# Patient Record
Sex: Female | Born: 1980
Health system: Southern US, Community
[De-identification: ages and names within clinical notes are randomized; demographics above are authoritative.]

## PROBLEM LIST (undated history)

## (undated) DIAGNOSIS — J302 Other seasonal allergic rhinitis: Secondary | ICD-10-CM

## (undated) DIAGNOSIS — Q8500 Neurofibromatosis, unspecified: Secondary | ICD-10-CM

## (undated) DIAGNOSIS — T8859XA Other complications of anesthesia, initial encounter: Secondary | ICD-10-CM

## (undated) DIAGNOSIS — R000 Tachycardia, unspecified: Secondary | ICD-10-CM

## (undated) DIAGNOSIS — M419 Scoliosis, unspecified: Secondary | ICD-10-CM

## (undated) DIAGNOSIS — F419 Anxiety disorder, unspecified: Secondary | ICD-10-CM

## (undated) DIAGNOSIS — K219 Gastro-esophageal reflux disease without esophagitis: Secondary | ICD-10-CM

## (undated) DIAGNOSIS — G43909 Migraine, unspecified, not intractable, without status migrainosus: Secondary | ICD-10-CM

## (undated) HISTORY — PX: WISDOM TOOTH EXTRACTION: SHX21

## (undated) HISTORY — DX: Other seasonal allergic rhinitis: J30.2

## (undated) HISTORY — DX: Scoliosis, unspecified: M41.9

## (undated) HISTORY — DX: Gastro-esophageal reflux disease without esophagitis: K21.9

## (undated) HISTORY — DX: Tachycardia, unspecified: R00.0

## (undated) HISTORY — DX: Anxiety disorder, unspecified: F41.9

## (undated) HISTORY — PX: TOE AMPUTATION: SHX809

## (undated) HISTORY — PX: DILATION AND CURETTAGE OF UTERUS: SHX78

---

## 2013-05-12 ENCOUNTER — Emergency Department (HOSPITAL_COMMUNITY)
Admission: EM | Admit: 2013-05-12 | Discharge: 2013-05-12 | Disposition: A | Discharge status: Home / Self Care | Payer: Self-pay | Attending: Emergency Medicine | Admitting: Emergency Medicine

## 2013-05-12 ENCOUNTER — Encounter (HOSPITAL_COMMUNITY): Payer: Self-pay | Admitting: Emergency Medicine

## 2013-05-12 DIAGNOSIS — Z3202 Encounter for pregnancy test, result negative: Secondary | ICD-10-CM | POA: Insufficient documentation

## 2013-05-12 DIAGNOSIS — Z8679 Personal history of other diseases of the circulatory system: Secondary | ICD-10-CM | POA: Insufficient documentation

## 2013-05-12 DIAGNOSIS — R51 Headache: Secondary | ICD-10-CM | POA: Insufficient documentation

## 2013-05-12 DIAGNOSIS — B349 Viral infection, unspecified: Secondary | ICD-10-CM

## 2013-05-12 DIAGNOSIS — B9789 Other viral agents as the cause of diseases classified elsewhere: Secondary | ICD-10-CM | POA: Insufficient documentation

## 2013-05-12 DIAGNOSIS — R112 Nausea with vomiting, unspecified: Secondary | ICD-10-CM | POA: Insufficient documentation

## 2013-05-12 HISTORY — DX: Neurofibromatosis, unspecified: Q85.00

## 2013-05-12 HISTORY — DX: Migraine, unspecified, not intractable, without status migrainosus: G43.909

## 2013-05-12 LAB — URINALYSIS, ROUTINE W REFLEX MICROSCOPIC
BILIRUBIN URINE: NEGATIVE
Glucose, UA: NEGATIVE mg/dL
Hgb urine dipstick: NEGATIVE
Ketones, ur: 15 mg/dL — AB
Leukocytes, UA: NEGATIVE
Nitrite: NEGATIVE
PH: 6 (ref 5.0–8.0)
Protein, ur: NEGATIVE mg/dL
Specific Gravity, Urine: >1.03 — ABNORMAL HIGH (ref 1.005–1.030)
Urobilinogen, UA: 0.2 mg/dL (ref 0.0–1.0)

## 2013-05-12 MED ORDER — ONDANSETRON 8 MG PO TBDP
8.0000 mg | ORAL_TABLET | Freq: Once | ORAL | Status: AC
  Administered 2013-05-12: 8 mg via ORAL
  Filled 2013-05-12: qty 1

## 2013-05-12 MED ORDER — ACETAMINOPHEN 500 MG PO TABS
1000.0000 mg | ORAL_TABLET | Freq: Once | ORAL | Status: AC
  Administered 2013-05-12: 1000 mg via ORAL
  Filled 2013-05-12: qty 2

## 2013-05-12 NOTE — ED Provider Notes (Signed)
  Medical screening examination/treatment/procedure(s) were performed by non-physician practitioner and as supervising physician I was immediately available for consultation/collaboration.     Apryll Hinkle, MD 05/12/13 1536 

## 2013-05-12 NOTE — Discharge Instructions (Signed)
Viral Infections A viral infection can be caused by different types of viruses.Most viral infections are not serious and resolve on their own. However, some infections may cause severe symptoms and may lead to further complications. SYMPTOMS Viruses can frequently cause:  Minor sore throat.  Aches and pains.  Headaches.  Runny nose.  Different types of rashes.  Watery eyes.  Tiredness.  Cough.  Loss of appetite.  Gastrointestinal infections, resulting in nausea, vomiting, and diarrhea. These symptoms do not respond to antibiotics because the infection is not caused by bacteria. However, you might catch a bacterial infection following the viral infection. This is sometimes called a "superinfection." Symptoms of such a bacterial infection may include:  Worsening sore throat with pus and difficulty swallowing.  Swollen neck glands.  Chills and a high or persistent fever.  Severe headache.  Tenderness over the sinuses.  Persistent overall ill feeling (malaise), muscle aches, and tiredness (fatigue).  Persistent cough.  Yellow, green, or brown mucus production with coughing. HOME CARE INSTRUCTIONS   Only take over-the-counter or prescription medicines for pain, discomfort, diarrhea, or fever as directed by your caregiver.  Drink enough water and fluids to keep your urine clear or pale yellow. Sports drinks can provide valuable electrolytes, sugars, and hydration.  Get plenty of rest and maintain proper nutrition. Soups and broths with crackers or rice are fine. SEEK IMMEDIATE MEDICAL CARE IF:   You have severe headaches, shortness of breath, chest pain, neck pain, or an unusual rash.  You have uncontrolled vomiting, diarrhea, or you are unable to keep down fluids.  You or your child has an oral temperature above 102 F (38.9 C), not controlled by medicine.  Your baby is older than 3 months with a rectal temperature of 102 F (38.9 C) or higher.  Your baby is 51  months old or younger with a rectal temperature of 100.4 F (38 C) or higher. MAKE SURE YOU:   Understand these instructions.  Will watch your condition.  Will get help right away if you are not doing well or get worse. Document Released: 01/06/2005 Document Revised: 06/21/2011 Document Reviewed: 08/03/2010 [facility] Patient Information 2014 [place], [place].    Emergency Department Resource Guide 1) Find a Doctor and Pay Out of Pocket Although you won't have to find out who is covered by your insurance plan, it is a good idea to ask around and get recommendations. You will then need to call the office and see if the doctor you have chosen will accept you as a new patient and what types of options they offer for patients who are self-pay. Some doctors offer discounts or will set up payment plans for their patients who do not have insurance, but you will need to ask so you aren't surprised when you get to your appointment.  2) Contact Your Local Health Department Not all health departments have doctors that can see patients for sick visits, but many do, so it is worth a call to see if yours does. If you don't know where your local health department is, you can check in your phone book. The CDC also has a tool to help you locate your state's health department, and many state websites also have listings of all of their local health departments.  3) Find a Walk-in Clinic If your illness is not likely to be very severe or complicated, you may want to try a walk in clinic. These are popping up all over the country in pharmacies, drugstores, and shopping  centers. They're usually staffed by nurse practitioners or physician assistants that have been trained to treat common illnesses and complaints. They're usually fairly quick and inexpensive. However, if you have serious medical issues or chronic medical problems, these are probably not your best option.  No Primary Care Doctor: - Call Health Connect  at  [phone] - they can help you locate a primary care doctor that  accepts your insurance, provides certain services, etc. - Physician Referral Service- [phone]  Chronic Pain Problems: Organization         Address  Phone   Notes  [facility]  [phone] Patients need to be referred by their primary care doctor.   Medication Assistance: Organization         Address  Phone   Notes  [facility] [address] [place], [place] [address] [phone] --Must be a resident of [facility] -- Must have NO insurance coverage whatsoever (no Medicaid/ Medicare, etc.) -- The pt. MUST have a primary care doctor that directs their care regularly and follows them in the community   [facility]  [phone]   [employer]  [phone]    Agencies that provide inexpensive medical care: Organization         Address  Phone   Notes  [facility]  [phone]   [facility]    [phone]   [facility] [address] [place], [place] [address] [phone]   [facility] [address] [place], [place] [phone]   Planned Parenthood    [phone]   [facility]    [phone]   [facility]  [address], [place] Phone:  [phone], Fax:  [phone] Hours of Operation:  9 am - 6 pm, M-F.  Also accepts Medicaid/Medicare and self-pay.  [facility]  [address], [place] Phone: [phone], Fax: [phone]. Hours of Operation:  8:30 am - 5:30 pm, M-F.  Also accepts Medicaid and self-pay.  [facility] [place] [address] [place] Phone: [phone]   [facility] [address] Natasha Bence [place], [place] [phone], Ext. [phone] Mondays & Thursdays: 7-9 AM.  First 15 patients are seen on a first come, first serve basis.     Medicaid-accepting [facility] Providers:  Organization         Address  Phone   Notes  [facility] [address], [place] [phone] Also accepts self-pay patients.  [facility] [address] Laurell Josephs [place], [place]  [phone]   [facility] [address], [place] [phone]   [facility] [address] [place] [phone]   Renaye Rakers [address], [place]   [phone] Only accepts [place] patients after they have their name applied to their card.   Self-Pay (no insurance) in [facility]:  Organization         Address  Phone   Notes  Sickle Cell Patients, [facility] [address] [place], [place] [phone]   [facility] [address] [place], [place] [phone]   [facility] [place]  [address],  [phone]  [facility]/Dr. Osei-Bonsu  [address] [place] [address], [place] or [address], [place] [phone] Phone number for both [place] and [place] locations is the same.  [facility] [address], [place] [phone]   [facility] [place] [address] [place] [address], [place] or [address] [phone]   [facility] [address], [place] [phone]) [phone], phone; [phone], fax Sees patients 1st and 3rd Saturday of every month.  Must not qualify for public or private insurance (i.e. Medicaid, Medicare, [place] Health Choice, Veterans' Benefits)  Household income should be no more than 200% of the poverty level The clinic cannot treat you if you are pregnant or think you are pregnant  Sexually transmitted diseases are not treated at the clinic.    Dental Care: Organization         Address  Phone  Notes  [facility]  Department of [facility] [address], [place] [phone] Accepts children up to age 821 who are enrolled in [place]icaid or [place] Health Choice; pregnant women with a Medicaid card; and children who have applied for Medicaid or [place] Health Choice, but were declined, whose parents can pay a reduced fee at time of service.  [facility] [facility] of [facility] [place]  [address], [place] [phone] Accepts children up to age 33 who are enrolled in [place] or [place] Health Choice; pregnant women with a Medicaid card; and children who have applied for Medicaid or [place] Health Choice, but were declined, whose parents can pay a reduced fee at time of service.  [facility]  [address], [place] [phone]) [phone] Patients are seen by appointment only. Walk-ins are not accepted. [facility] will see patients 718 years of age and older. Monday - Tuesday (8am-5pm) Most Wednesdays (8:30-5pm) $30 per visit, cash only  [facility]  [address], [facility] [phone]) [phone] Patients are seen by appointment only. Walk-ins are not accepted. [facility] will see patients 33 years of age and older. One Wednesday Evening (Monthly: Volunteer Based).  $30 per visit, cash only  [employer] [facility]  [phone] for adults; Children under age 134, call Graduate Pediatric Dentistry at [phone]. Children aged 734-14, please call [phone] to request a pediatric application.  Dental services are provided in all areas of dental care including fillings, crowns and bridges, complete and partial dentures, implants, gum treatment, root canals, and extractions. Preventive care is also provided. Treatment is provided to both adults and children. Patients are selected via a lottery and there is often a waiting list.   [facility] [address], [place]  [phone]  [email or web address]   [facility] [address], [place], [place] [phone], Ext. [phone] Second and Fourth Thursday of each month, opens at 6:30 AM; Clinic ends at 9 AM.  Patients are seen on a first-come first-served basis, and a limited number are seen during each clinic.   [facility]  [address], [place], [place] [phone]) [phone]   Eligibility Requirements You must have lived in [place], [place], or [place] counties for at least the last three months.   You cannot be eligible for state or federal sponsored National Cityhealthcare insurance, including CIGNAVeterans Administration, [place]icaid, or [employer]Medicare.   You generally cannot be eligible for healthcare insurance  through your employer.    How to apply: Eligibility screenings are held every Tuesday and Wednesday afternoon from 1:00 pm until 4:00 pm. You do not need an appointment for the interview!  [facility] [address], [place], [place] [phone]   [facility]  [phone]   [facility] [facility]  [phone]   [facility]  [phone]    Behavioral Health Resources in the Community: Intensive Outpatient Programs Organization         Address  Phone  Notes  [facility] [address], [place], [place] [phone]   [facility] Outpatient [address], [place], [place] [phone]   [facility] [address], [place], [place]  [phone]   [facility] [address],  [place], [place] [phone] or [phone]   Substance Abuse Resources Organization         Address  Phone  Notes  [facility]  [phone]   [facility]  [phone]   [facility]  [phone]   [facility]  [phone]   Residential & Outpatient Substance Abuse Program  [phone]   Psychological Services Organization          Address  Phone  Notes  [facility]  [phone]- [phone]   [facility]  [phone]- [phone]   [facility] [address], [place] [phone] or [phone]    Mobile Crisis Teams Organization         Address  Phone  Notes  [facility], Mobile Crisis Care Unit  [phone]   [facility]  [address] [place], [place] [phone]   Doristine LocksSharon DeEsch [address] [place] [phone]    Self-Help/Support Groups Organization         Address  Phone             Notes  Mental Health Assoc. of  - variety of support groups  [phone] Call for more information  Narcotics Anonymous (NA), [facility] [address], [place]  2 meetings at this location   Statisticianesidential Treatment Programs Organization         Address  Phone  Notes  [facility] [address],    [place]  [phone]   [facility]  [address], [place] [phone], [place], [place] [phone]   [facility] [address], [place] [phone] Admissions: 8am-3pm M-F  [facility] [address]    [place], [place] [phone]   [facility] [address], [place], [place] [phone]   [facility] [address]  [place], [place] [phone]   [facility] - Intensive Outpatient [address], [place], [place] [phone]   [facility] ([facility] [address]  [place], [place] [phone] or [phone]   [facility] (RT[facility]) [address] [place], [place] [phone] Accepts Medicaid  [facility] [address]  [place] [phone] Substance Abuse/Addiction Treatment   [facility]al Health Resources Organization         Address  Phone  Notes  [facility]  [phone]   Angie FavaJulie Brannon, PhD [address] [place], [place]   [phone] or [phone]) [phone]   [facility]   [address] [place], [place] [phone]  [facility] [address], [place], [place] [phone] Insurance/Medicaid/sponsorship through [facility]  [facility] Families [address]                                    [place], [place] [phone] Therapy/tele-psych/case  [facility] [address]   [place], [place] [phone]    Dr. Lolly MustacheArfeen  [phone]   [facility]  [employer] [facility] 1) [address], [place] 2) [address], [place] 3)  [address], [place] [phone]  [phone]) [phone]   [facility] Child Abuse Hotline [phone]) [phone] or [phone](336) [phone] (After Hours)         Rest and make sure you are drinking plenty of fluids.  Use your home phenergan if needed for continued nausea.

## 2013-05-12 NOTE — ED Notes (Signed)
Pt given water and sipping at this time.   nad noted.

## 2013-05-12 NOTE — ED Notes (Signed)
Pt c/o decreased appetite, headache, body aches, chills that started two days ago, n/v X1 this am while in waiting room,

## 2013-05-12 NOTE — ED Provider Notes (Signed)
CSN: [id number]     Arrival date & time 05/12/13  [id number] History   First MD Initiated Contact with Patient 05/12/13 [phone]     Chief Complaint  Patient presents with  . Headache  . Emesis  . Chills   (Consider location/radiation/quality/duration/timing/severity/associated sxs/prior Treatment) HPI Comments: Anna Willis is a 33 y.o. Female with a history of neurofibromatosis and migraine headache presenting with a 2 day history of generalized intermittent headache (which is not similar to her typical migraine which is generally right sided in location), along with decreased appetite, nausea, generalized body aches and chills and one episode of vomiting this morning upon arrival in our waiting room.  She denies abdominal pain, diarrhea, last BM was yesterday and normal.  She denies increased urinary frequency or dysuria, has had no cough, shortness of breath or chest pain, dizziness, neck pain or stiffness.  She is taking no medications for her symptoms.  She she states been under increased stress as [place], started a new job and has not been getting enough rest.   The history is provided by the patient.    Past Medical History  Diagnosis Date  . Migraine   . Neurofibromatosis    Past Surgical History  Procedure Laterality Date  . Dilation and curettage of uterus    . Toe amputation      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    No family history on file. History  Substance Use Topics  . Smoking status: Never Smoker   . Smokeless tobacco: Not on file  . Alcohol Use: No   OB History   Grav Para Term Preterm Abortions TAB SAB Ect Mult Living                 Review of Systems  Constitutional: Positive for chills and appetite change. Negative for fever.  HENT: Negative for congestion, postnasal drip, rhinorrhea, sinus pressure, sore throat and trouble swallowing.   Eyes: Negative.   Respiratory: Negative for chest tightness and shortness of breath.    Cardiovascular: Negative for chest pain.  Gastrointestinal: Positive for nausea and vomiting. Negative for abdominal pain.  Genitourinary: Negative.  Negative for dysuria.  Musculoskeletal: Negative for arthralgias, joint swelling and neck pain.  Skin: Negative.  Negative for rash and wound.  Neurological: Positive for headaches. Negative for dizziness, weakness, light-headedness and numbness.  Psychiatric/Behavioral: Negative.     Allergies  Zithromax  Home Medications  No current outpatient prescriptions on file. BP 124/67  Pulse 111  Temp(Src) 97.7 F (36.5 C) (Oral)  Resp 18  Ht 5' (1.524 m)  Wt 130 lb (58.968 kg)  BMI 25.39 kg/m2  SpO2 99%  LMP 04/22/2013 Physical Exam  Nursing note and vitals reviewed. Constitutional: She is oriented to person, place, and time. She appears well-developed and well-nourished.  HENT:  Head: Normocephalic and atraumatic.  Mouth/Throat: Oropharynx is clear and moist.  Eyes: EOM are normal. Pupils are equal, round, and reactive to light.  Neck: Normal range of motion. Neck supple.  Cardiovascular: Normal rate and normal heart sounds.   Pulmonary/Chest: Effort normal.  Abdominal: Soft. There is no tenderness.  Musculoskeletal: Normal range of motion.  Lymphadenopathy:    She has no cervical adenopathy.  Neurological: She is alert and oriented to person, place, and time. She has normal strength. No sensory deficit. Gait normal. GCS eye subscore is 4. GCS verbal subscore is 5. GCS motor subscore is 6.  Normal heel-shin, normal rapid  alternating movements. Cranial nerves III-XII intact.  No pronator drift.  Skin: Skin is warm and dry. No rash noted.  Psychiatric: She has a normal mood and affect. Her speech is normal and behavior is normal. Thought content normal. Cognition and memory are normal.    ED Course  Procedures (including critical care time) Labs Review Labs Reviewed  URINALYSIS, ROUTINE W REFLEX MICROSCOPIC - Abnormal; Notable  for the following:    Specific Gravity, Urine >1.030 (*)    Ketones, ur 15 (*)    All other components within normal limits    Bedside u preg negative.   Imaging Review No results found.  EKG Interpretation   None       MDM   1. Viral syndrome    Hx c/w viral syndrome,  Possibly mild influenza.  She did not get a flu vaccine this season.  She has tolerated PO fluids in ed,  Headache improved with tylenol, nausea resolving with zofran.  She has home phenergan and will take this if nausea returns.   The patient appears reasonably screened and/or stabilized for discharge and I doubt any other medical condition or other [facility] requiring further screening, evaluation, or treatment in the ED at this time prior to discharge.     Burgess Amor, PA-C 05/12/13 [phone]

## 2013-05-14 LAB — POCT PREGNANCY, URINE: Preg Test, Ur: NEGATIVE

## 2014-06-23 ENCOUNTER — Encounter (HOSPITAL_COMMUNITY): Payer: Self-pay | Admitting: Emergency Medicine

## 2014-06-23 ENCOUNTER — Emergency Department (HOSPITAL_COMMUNITY)
Admission: EM | Admit: 2014-06-23 | Discharge: 2014-06-23 | Disposition: A | Discharge status: Home / Self Care | Payer: BLUE CROSS/BLUE SHIELD | Source: Home / Self Care | Attending: Family Medicine | Admitting: Family Medicine

## 2014-06-23 DIAGNOSIS — A084 Viral intestinal infection, unspecified: Secondary | ICD-10-CM

## 2014-06-23 MED ORDER — ACETAMINOPHEN 325 MG PO TABS
975.0000 mg | ORAL_TABLET | Freq: Once | ORAL | Status: AC
  Administered 2014-06-23: 975 mg via ORAL

## 2014-06-23 MED ORDER — SODIUM CHLORIDE 0.9 % IV BOLUS (SEPSIS)
1000.0000 mL | Freq: Once | INTRAVENOUS | Status: AC
  Administered 2014-06-23: 1000 mL via INTRAVENOUS

## 2014-06-23 MED ORDER — ONDANSETRON HCL 4 MG/2ML IJ SOLN
INTRAMUSCULAR | Status: AC
  Filled 2014-06-23: qty 2

## 2014-06-23 MED ORDER — ONDANSETRON HCL 8 MG PO TABS: 8.0000 mg | ORAL_TABLET | Freq: Three times a day (TID) | ORAL | Status: DC | PRN

## 2014-06-23 MED ORDER — ACETAMINOPHEN 325 MG PO TABS
ORAL_TABLET | ORAL | Status: AC
  Filled 2014-06-23: qty 3

## 2014-06-23 MED ORDER — ONDANSETRON HCL 4 MG/2ML IJ SOLN
4.0000 mg | Freq: Once | INTRAMUSCULAR | Status: AC
  Administered 2014-06-23: 4 mg via INTRAVENOUS

## 2014-06-23 NOTE — ED Notes (Signed)
Reports acute on set of  N/v/d, hot cold chills,  Congestion.  States within the several minutes of waiting now having body aches.

## 2014-06-23 NOTE — ED Notes (Signed)
Iv  Ns   1  Liter   Wide  Open

## 2014-06-23 NOTE — ED Provider Notes (Signed)
Anna Willis is a 34 y.o. female who presents to Urgent Care today for Vomiting and diarrhea. 2 days ago patient had runny nose and nasal congestion. This morning she awoke with vomiting diarrhea and body aches. She has not tried any medications yet. No fevers or chills. No chest pains or palpitations. Vomiting is nonbloody and nonbilious. No blood in the diarrhea. Patient is currently taking bromocriptine in preparation for intrauterine insemination scheduled for 6 days from now. She has a history of neurofibromatosis type I.   Past Medical History  Diagnosis Date  . Migraine   . Neurofibromatosis    Past Surgical History  Procedure Laterality Date  . Dilation and curettage of uterus    . Toe amputation      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    History  Substance Use Topics  . Smoking status: Never Smoker   . Smokeless tobacco: Not on file  . Alcohol Use: No   ROS as above Medications: No current facility-administered medications for this encounter.   Current Outpatient Prescriptions  Medication Sig Dispense Refill  . bromocriptine (PARLODEL) 5 MG capsule Take 5 mg by mouth daily.    . Lansoprazole (PREVACID PO) Take by mouth.    . Loratadine (CLARITIN PO) Take by mouth.    . ondansetron (ZOFRAN) 8 MG tablet Take 1 tablet (8 mg total) by mouth every 8 (eight) hours as needed for nausea or vomiting. 20 tablet 0   Allergies  Allergen Reactions  . Zithromax [Azithromycin]     Rash      Exam:  BP 125/91 mmHg  Pulse 121  Temp(Src) 98.7 F (37.1 C) (Oral)  Resp 16  SpO2 96%  LMP 06/17/2014 Gen: Well NAD HEENT: EOMI,  MMM Lungs: Normal work of breathing. CTABL Heart: tachycardia no MRG Abd: NABS, Soft. Nondistended, Nontender Exts: Brisk capillary refill, warm and well perfused.   Patient was given 1 L normal saline bolus, 4 mg of IV Zofran, and 975 mg of oral Tylenol, and felt much better.  No results found for this or any previous visit (from the past 24  hour(s)). No results found.  Assessment and Plan: 34 y.o. female with Viral gastroenteritis.  Treat with Zofran Tylenol and Imodium. Continue oral hydration. Return as needed.  Discussed warning signs or symptoms. Please see discharge instructions. Patient expresses understanding.     Rodolph BongEvan S Manolo Bosket, MD 06/23/14 1115

## 2014-06-23 NOTE — Discharge Instructions (Signed)
Thank you for coming in today. Continue over-the-counter Tylenol and Imodium as needed. Take Zofran as needed for vomiting. Drink plenty of fluids. If your belly pain worsens, or you have high fever, bad vomiting, blood in your stool or black tarry stool go to the Emergency Room.   Viral Gastroenteritis Viral gastroenteritis is also known as stomach flu. This condition affects the stomach and intestinal tract. It can cause sudden diarrhea and vomiting. The illness typically lasts 3 to 8 days. Most people develop an immune response that eventually gets rid of the virus. While this natural response develops, the virus can make you quite ill. CAUSES  Many different viruses can cause gastroenteritis, such as rotavirus or noroviruses. You can catch one of these viruses by consuming contaminated food or water. You may also catch a virus by sharing utensils or other personal items with an infected person or by touching a contaminated surface. SYMPTOMS  The most common symptoms are diarrhea and vomiting. These problems can cause a severe loss of body fluids (dehydration) and a body salt (electrolyte) imbalance. Other symptoms may include:  Fever.  Headache.  Fatigue.  Abdominal pain. DIAGNOSIS  Your caregiver can usually diagnose viral gastroenteritis based on your symptoms and a physical exam. A stool sample may also be taken to test for the presence of viruses or other infections. TREATMENT  This illness typically goes away on its own. Treatments are aimed at rehydration. The most serious cases of viral gastroenteritis involve vomiting so severely that you are not able to keep fluids down. In these cases, fluids must be given through an intravenous line (IV). HOME CARE INSTRUCTIONS   Drink enough fluids to keep your urine clear or pale yellow. Drink small amounts of fluids frequently and increase the amounts as tolerated.  Ask your caregiver for specific rehydration  instructions.  Avoid:  Foods high in sugar.  Alcohol.  Carbonated drinks.  Tobacco.  Juice.  Caffeine drinks.  Extremely hot or cold fluids.  Fatty, greasy foods.  Too much intake of anything at one time.  Dairy products until 24 to 48 hours after diarrhea stops.  You may consume probiotics. Probiotics are active cultures of beneficial bacteria. They may lessen the amount and number of diarrheal stools in adults. Probiotics can be found in yogurt with active cultures and in supplements.  Wash your hands well to avoid spreading the virus.  Only take over-the-counter or prescription medicines for pain, discomfort, or fever as directed by your caregiver. Do not give aspirin to children. Antidiarrheal medicines are not recommended.  Ask your caregiver if you should continue to take your regular prescribed and over-the-counter medicines.  Keep all follow-up appointments as directed by your caregiver. SEEK IMMEDIATE MEDICAL CARE IF:   You are unable to keep fluids down.  You do not urinate at least once every 6 to 8 hours.  You develop shortness of breath.  You notice blood in your stool or vomit. This may look like coffee grounds.  You have abdominal pain that increases or is concentrated in one small area (localized).  You have persistent vomiting or diarrhea.  You have a fever.  The patient is a child younger than 3 months, and he or she has a fever.  The patient is a child older than 3 months, and he or she has a fever and persistent symptoms.  The patient is a child older than 3 months, and he or she has a fever and symptoms suddenly get worse.  The  patient is a baby, and he or she has no tears when crying. MAKE SURE YOU:   Understand these instructions.  Will watch your condition.  Will get help right away if you are not doing well or get worse. Document Released: 03/29/2005 Document Revised: 06/21/2011 Document Reviewed: 01/13/2011 [facility] Patient  Information 2015 [place], [place]. This information is not intended to replace advice given to you by your health care provider. Make sure you discuss any questions you have with your health care provider.

## 2014-06-26 ENCOUNTER — Telehealth (HOSPITAL_COMMUNITY): Payer: Self-pay | Admitting: *Deleted

## 2014-06-26 NOTE — ED Notes (Addendum)
Pt. called and said she had IV Zofran here and asked if she can take one now po. States she had 4 mg. @ 1017.  I told her it has been 6 1/2 hrs. So she could have another 4 mg.   She asked if is was OK to take Tylenol PM to get some rest. I told her is was OK. Vassie MoselleYork, Alphonso Gregson M 06/23/2014

## 2015-01-06 ENCOUNTER — Other Ambulatory Visit (HOSPITAL_COMMUNITY): Payer: Self-pay | Admitting: Obstetrics and Gynecology

## 2015-01-06 DIAGNOSIS — R947 Abnormal results of other endocrine function studies: Secondary | ICD-10-CM

## 2015-01-08 ENCOUNTER — Ambulatory Visit (HOSPITAL_COMMUNITY)
Admission: RE | Admit: 2015-01-08 | Discharge: 2015-01-08 | Disposition: A | Discharge status: Home / Self Care | Payer: BLUE CROSS/BLUE SHIELD | Source: Ambulatory Visit | Attending: Obstetrics and Gynecology | Admitting: Obstetrics and Gynecology

## 2015-01-08 DIAGNOSIS — R947 Abnormal results of other endocrine function studies: Secondary | ICD-10-CM

## 2015-01-08 MED ORDER — GADOBENATE DIMEGLUMINE 529 MG/ML IV SOLN
15.0000 mL | Freq: Once | INTRAVENOUS | Status: AC | PRN
  Administered 2015-01-08: 12 mL via INTRAVENOUS

## 2015-01-22 ENCOUNTER — Encounter: Payer: Self-pay | Admitting: Neurology

## 2015-01-22 ENCOUNTER — Ambulatory Visit (INDEPENDENT_AMBULATORY_CARE_PROVIDER_SITE_OTHER): Payer: BLUE CROSS/BLUE SHIELD | Admitting: Neurology

## 2015-01-22 VITALS — BP 120/86 | HR 92 | Ht 60.0 in | Wt 156.0 lb

## 2015-01-22 DIAGNOSIS — Q8500 Neurofibromatosis, unspecified: Secondary | ICD-10-CM | POA: Diagnosis not present

## 2015-01-22 DIAGNOSIS — G43109 Migraine with aura, not intractable, without status migrainosus: Secondary | ICD-10-CM | POA: Diagnosis not present

## 2015-01-22 MED ORDER — NORTRIPTYLINE HCL 10 MG PO CAPS: ORAL_CAPSULE | ORAL | Status: DC

## 2015-01-22 MED ORDER — RIZATRIPTAN BENZOATE 5 MG PO TBDP: 5.0000 mg | ORAL_TABLET | ORAL | Status: DC | PRN

## 2015-01-22 NOTE — Progress Notes (Signed)
PATIENT: Anzleigh Slaven DOB: Apr 06, 1981  Chief Complaint  Patient presents with  . Neurofibromatosis    She is here with her partner, Romonda. She was diagnosed as child with this condition. She has noticed a worsening of pain in a specific spot behind her right ear and low back.  . Migraine    She gets 2-3 migraines per month.  She typically take Excedrin Migraine for relief and it is helpful if she takes it at onset.  Her headaches are associated with nausea, vomiting, noise and light sensitivity.  She has never taken a prophylactic or rescue medication in the past.  . Chronic Sinusitis    She is seeing an ENT today for this condition, discovered on her recrent MRI.     HISTORICAL  Trinitee Horgan is a 34 years old right-handed female,accompanied by her partner Rashada, seen in refer by her  primary care physician  Edwinna Areola for evaluation of headaches  She was born with polydactyly, of both hand, left foot, require surgical intervention. She also had a history of neurofibromatosis type I, born with multiple caf au lait spot , over the years, she also noticed developed of some subcutaneous neurofibroma.  She reported a history of migraine all her life, only occasionally happened in the past, increased frequency since 2015,she is she went to individual fertilization procedure, she was recently noticed to have elevated prolactin level, were started on bromocriptine treatment, I have personally reviewed MRI of the brain with and without contrast September 2016, that was normal, in specific, there was no pituitary abnormality noticed.  He complains of headaches "all the time now", her typical migraine are right retro-orbital area severe pounding headache was associated light noise sensitivity, intense pounding headaches, she has it about couple times each week, each one last 3-4 hours,  Trigger for her migraines are humidity, exertion, being hungry, dehydration, she did not notice  any food sensitivity,  Both her parents has migraines  She also complains of chronic low back pain, bilateral lower extremity deep achy pain, restless of her legs when resting,  REVIEW OF SYSTEMS: Full 14 system review of systems performed and notable only for fatigue, joint pain,achy muscles, headaches, weakness, restless legs  ALLERGIES: Allergies  Allergen Reactions  . Zithromax [Azithromycin]     Rash     HOME MEDICATIONS: Current Outpatient Prescriptions  Medication Sig Dispense Refill  . bromocriptine (PARLODEL) 5 MG capsule Take 5 mg by mouth daily.    . Lansoprazole (PREVACID PO) Take by mouth.    . Loratadine (CLARITIN PO) Take by mouth.    . ondansetron (ZOFRAN) 8 MG tablet Take 1 tablet (8 mg total) by mouth every 8 (eight) hours as needed for nausea or vomiting. 20 tablet 0     PAST MEDICAL HISTORY: Past Medical History  Diagnosis Date  . Migraine   . Neurofibromatosis (HCC)   . Anxiety   . Acid reflux   . Scoliosis   . Tachycardia   . Seasonal allergies     PAST SURGICAL HISTORY: Past Surgical History  Procedure Laterality Date  . Dilation and curettage of uterus    . Toe amputation      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     FAMILY HISTORY: Family History  Problem Relation Age of Onset  . Migraines Mother   . Epilepsy Mother   . Neurofibromatosis Father     SOCIAL HISTORY:  Social History   Social History  . Marital  Status: Single    Spouse Name: N/A  . Number of Children: 0  . Years of Education: 14   Occupational History  . Hair dresser    Social History Main Topics  . Smoking status: Never Smoker   . Smokeless tobacco: Not on file  . Alcohol Use: No  . Drug Use: No  . Sexual Activity: Not on file   Other Topics Concern  . Not on file   Social History Narrative   Lives at home with her partner, Ireene.   Right-handed.   0.5 cup coffee and 1 soda per day.     PHYSICAL EXAM   Filed Vitals:   01/22/15 0939  BP: 120/86    Pulse: 92  Height: 5' (1.524 m)  Weight: 156 lb (70.761 kg)    Not recorded      Body mass index is 30.47 kg/(m^2).  PHYSICAL EXAMNIATION:  Gen: NAD, conversant, well nourised, obese, well groomed                     Cardiovascular: Regular rate rhythm, no peripheral edema, warm, nontender. Eyes: Conjunctivae clear without exudates or hemorrhage Neck: Supple, no carotid bruise. Pulmonary: Clear to auscultation bilaterally  Musculoskeletal: She has Lower lumbar subcutaneous neurofibroma, right mastoid area soft tissue, consistent with possible neurofibroma as well  NEUROLOGICAL EXAM:  MENTAL STATUS: Speech:    Speech is normal; fluent and spontaneous with normal comprehension.  Cognition:     Orientation to time, place and person     Normal recent and remote memory     Normal Attention span and concentration     Normal Language, naming, repeating,spontaneous speech     Fund of knowledge   CRANIAL NERVES: CN II: Visual fields are full to confrontation. Fundoscopic exam is normal with sharp discs and no vascular changes. Pupils are round equal and briskly reactive to light. CN III, IV, VI: extraocular movement are normal. No ptosis. CN V: Facial sensation is intact to pinprick in all 3 divisions bilaterally. Corneal responses are intact.  CN VII: Face is symmetric with normal eye closure and smile. CN VIII: Hearing is normal to rubbing fingers CN IX, X: Palate elevates symmetrically. Phonation is normal. CN XI: Head turning and shoulder shrug are intact CN XII: Tongue is midline with normal movements and no atrophy.  MOTOR: There is no pronator drift of out-stretched arms. Muscle bulk and tone are normal. Muscle strength is normal.  REFLEXES: Reflexes are 2+ and symmetric at the biceps, triceps, knees, and ankles. Plantar responses are flexor.  SENSORY: Intact to light touch, pinprick, position sense, and vibration sense are intact in fingers and  toes.  COORDINATION: Rapid alternating movements and fine finger movements are intact. There is no dysmetria on finger-to-nose and heel-knee-shin.    GAIT/STANCE: Posture is normal. Gait is steady with normal steps, base, arm swing, and turning. Heel and toe walking are normal. Tandem gait is normal.  Romberg is absent.   DIAGNOSTIC DATA (LABS, IMAGING, TESTING) - I reviewed patient records, labs, notes, testing and imaging myself where available.   ASSESSMENT AND PLAN  Quenesha Douglass is a 34 y.o. female    Chronic migraine  Start preventive medications,nortriptyline 10 mg, titrating to 20 mg every night  Cambia as needed  Triptan has a potential interaction with bromocriptine, will hold off at this point Neurofibromatosis type I  Restless leg syndrome  If she continues to complains of lower extremity deep achy pain, will consider CBC,  iron panel  Levert FeinsteinYijun Anessa Charley, M.D. Ph.D.  [facility] [address], [address] [place], [place] [address] Ph: [phone] Fax: [phone]  ZH:[id number]:Cecilia Delene LollWorema Banga, [place]DO

## 2015-01-23 ENCOUNTER — Telehealth: Payer: Self-pay

## 2015-01-23 MED ORDER — DICLOFENAC POTASSIUM(MIGRAINE) 50 MG PO PACK: 50.0000 mg | PACK | ORAL | Status: DC | PRN

## 2015-01-23 NOTE — Telephone Encounter (Signed)
Pharmacy has been notified.

## 2015-01-23 NOTE — Telephone Encounter (Signed)
Shanda BumpsJessica, please let pharmacy know, we will withdraw Maxalt prescription, [facility]

## 2015-01-23 NOTE — Telephone Encounter (Signed)
[facility] pharmacy sent a fax saying the patient is taking Bromocriptine from another provider, and that in combination with Maxalt could increase the risk of serotonin syndrome.  They wanted to make us aware, and also make sure it is okay to fill Maxalt.  Please advise.  Thank you.

## 2015-01-27 ENCOUNTER — Telehealth: Payer: Self-pay

## 2015-01-27 NOTE — Telephone Encounter (Signed)
[employer] [place] has approved the request for coverage on Cambia.  They only approved #9 packets per month, however, I have contacted them to ask for an additional approval (Quantity Limit Exception).  They have granted a QL exception as well.  Both approvals are effective until 01/27/2016 Ref # [id number]

## 2015-03-21 ENCOUNTER — Telehealth: Payer: Self-pay | Admitting: Neurology

## 2015-03-21 NOTE — Telephone Encounter (Signed)
I have called her home phone and cellphone, let message, Maxalt, which was originally prescribed, has potential interaction with bromocriptine, has been cancelled.   I have called in Cambia.  Appt in 03/25/2015

## 2015-03-21 NOTE — Telephone Encounter (Signed)
Pt called sts she has not heard back from [facility] regarding her medications. She sts there was some sort of interaction with meds and could not be presribed together. That was a few mths ago and she forgot about it until she rec'd reminder call for appt on 03/25/15. [facility] never got back in touch with pt. Please call and advise

## 2015-03-24 ENCOUNTER — Telehealth: Payer: Self-pay | Admitting: Neurology

## 2015-03-24 NOTE — Telephone Encounter (Signed)
I called [facility] and confirmed that both her nortriptyline and Cambia are ready to be filled.  She would like to continue care with Dr. Terrace ArabiaYan.  She will start nortriptyline today and a follow up appt has been scheduled for her.

## 2015-03-24 NOTE — Telephone Encounter (Signed)
Patient is calling and states [facility] [address] says they have not received the Rx Cambia 50 mg pack.  Could you please resend. Also, patient states that since the appointment tomorrow was for a medication check for this Rx she will cancel for now.  The patient states she would like to reschedule with another doctor. Please call her to schedule wit a new doctor.  Thanks!

## 2015-03-25 ENCOUNTER — Ambulatory Visit: Payer: BLUE CROSS/BLUE SHIELD | Admitting: Neurology

## 2015-04-22 ENCOUNTER — Ambulatory Visit: Payer: Self-pay | Admitting: Neurology

## 2015-10-21 DIAGNOSIS — Z319 Encounter for procreative management, unspecified: Secondary | ICD-10-CM | POA: Insufficient documentation

## 2017-02-28 ENCOUNTER — Ambulatory Visit (INDEPENDENT_AMBULATORY_CARE_PROVIDER_SITE_OTHER): Payer: BLUE CROSS/BLUE SHIELD | Admitting: Orthopaedic Surgery

## 2017-03-15 ENCOUNTER — Ambulatory Visit (INDEPENDENT_AMBULATORY_CARE_PROVIDER_SITE_OTHER): Payer: PRIVATE HEALTH INSURANCE | Admitting: Orthopaedic Surgery

## 2017-03-15 DIAGNOSIS — M545 Low back pain, unspecified: Secondary | ICD-10-CM

## 2017-03-15 DIAGNOSIS — G8929 Other chronic pain: Secondary | ICD-10-CM | POA: Diagnosis not present

## 2017-03-15 NOTE — Progress Notes (Signed)
Office Visit Note   Patient: Anna Willis           Date of Birth: 23-Apr-1980           MRN: [id number] Visit Date: 03/15/2017              Requested by: Edwinna AreolaBanga, Cecilia Worema, DO [address] [place], [place] [address] PCP: Edwinna AreolaBanga, Cecilia Worema, DO   Assessment & Plan: Visit Diagnoses:  1. Chronic low back pain without sciatica, unspecified back pain laterality     Plan: Work note was given today for no prolonged sitting.  Otherwise she may work from a orthopedic standpoint.  Follow-up as needed.  If in the future she needs a more detailed explanation of her work restrictions she will benefit from a functional capacity evaluation.  Total face to face encounter time was greater than 45 minutes and over half of this time was spent in counseling and/or coordination of care.  Follow-Up Instructions: Return if symptoms worsen or fail to improve.   Orders:  No orders of the defined types were placed in this encounter.  No orders of the defined types were placed in this encounter.     Procedures: No procedures performed   Clinical Data: No additional findings.   Subjective: No chief complaint on file.   Patient is a 36 year old female who has neurofibromatosis comes in with chronic low back pain.  She is here for evaluation per vocational rehab request.  She has multiple other non-orthopedic manifestations of her neurofibromatosis.    Review of Systems  Constitutional: Negative.   HENT: Negative.   Eyes: Negative.   Respiratory: Negative.   Cardiovascular: Negative.   Endocrine: Negative.   Musculoskeletal: Negative.   Neurological: Negative.   Hematological: Negative.   Psychiatric/Behavioral: Negative.   All other systems reviewed and are negative.    Objective: Vital Signs: There were no vitals taken for this visit.  Physical Exam  Constitutional: She is oriented to person, place, and time. She appears well-developed and well-nourished.  HENT:    Head: Normocephalic and atraumatic.  Eyes: EOM are normal.  Neck: Neck supple.  Pulmonary/Chest: Effort normal.  Abdominal: Soft.  Neurological: She is alert and oriented to person, place, and time.  Skin: Skin is warm. Capillary refill takes less than 2 seconds.  Psychiatric: She has a normal mood and affect. Her behavior is normal. Judgment and thought content normal.  Nursing note and vitals reviewed.   Ortho Exam Low back exam shows tenderness in the lower consistent with a neurofibroma.  There is no skin changes otherwise.  She has mild weakness with left hip flexion and knee extension.  Sensation intact.  Reflexes symmetric.  Impression is neurofibromatosis with chronic low back pain. Specialty Comments:  No specialty comments available.  Imaging: No results found.   PMFS History: There are no active problems to display for this patient.  Past Medical History:  Diagnosis Date  . Acid reflux   . Anxiety   . Migraine   . Neurofibromatosis (HCC)   . Scoliosis   . Seasonal allergies   . Tachycardia     Family History  Problem Relation Age of Onset  . Migraines Mother   . Epilepsy Mother   . Neurofibromatosis Father     Past Surgical History:  Procedure Laterality Date  . DILATION AND CURETTAGE OF UTERUS    . TOE AMPUTATION     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  Social History   Occupational History  . Occupation: Hair dresser  Tobacco Use  . Smoking status: Never Smoker  Substance and Sexual Activity  . Alcohol use: No  . Drug use: No  . Sexual activity: Not on file

## 2017-03-31 ENCOUNTER — Telehealth (INDEPENDENT_AMBULATORY_CARE_PROVIDER_SITE_OTHER): Payer: Self-pay | Admitting: Orthopaedic Surgery

## 2017-03-31 NOTE — Telephone Encounter (Signed)
03/15/2017 OV NOTE FAXED TO SUSAN ARRINGTON @ [facility] [phone]

## 2017-05-12 ENCOUNTER — Emergency Department (HOSPITAL_COMMUNITY): Payer: Self-pay

## 2017-05-12 ENCOUNTER — Other Ambulatory Visit: Payer: Self-pay

## 2017-05-12 ENCOUNTER — Emergency Department (HOSPITAL_COMMUNITY)
Admission: EM | Admit: 2017-05-12 | Discharge: 2017-05-12 | Disposition: A | Discharge status: Home / Self Care | Payer: Self-pay | Attending: Emergency Medicine | Admitting: Emergency Medicine

## 2017-05-12 ENCOUNTER — Encounter (HOSPITAL_COMMUNITY): Payer: Self-pay | Admitting: Emergency Medicine

## 2017-05-12 DIAGNOSIS — Z79899 Other long term (current) drug therapy: Secondary | ICD-10-CM | POA: Insufficient documentation

## 2017-05-12 DIAGNOSIS — M79671 Pain in right foot: Secondary | ICD-10-CM | POA: Insufficient documentation

## 2017-05-12 NOTE — ED Notes (Signed)
Pt able to demonstrate correct use of crutches; pt discharged via wheelchair

## 2017-05-12 NOTE — ED Notes (Signed)
Pt gone over for xray 

## 2017-05-12 NOTE — ED Provider Notes (Signed)
[facility] Provider Note   CSN: [id number] Arrival date & time: 05/12/17  [id number]     History   Chief Complaint Chief Complaint  Patient presents with  . Foot Pain    HPI Anna Willis is a 37 y.o. female with past medical history significant for neurofibromatosis, scoliosis, migraine, anxiety presenting with sudden onset right foot pain.  Reports that she was playing with her goddaughter and does not recall a specific injury but later experienced pain with inversion, eversion and weightbearing along the lateral aspect of the dorsum of the foot up to proximal to the lateral malleolus.  She has not taken anything for pain as she is trying to stay away from any medications.  Denies numbness, weakness.  No other injury or symptoms.  HPI  Past Medical History:  Diagnosis Date  . Acid reflux   . Anxiety   . Migraine   . Neurofibromatosis (HCC)   . Scoliosis   . Seasonal allergies   . Tachycardia     There are no active problems to display for this patient.   Past Surgical History:  Procedure Laterality Date  . DILATION AND CURETTAGE OF UTERUS    . TOE AMPUTATION     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     OB History    No data available       Home Medications    Prior to Admission medications   Medication Sig Start Date End Date Taking? Authorizing Provider  aspirin-acetaminophen-caffeine (EXCEDRIN MIGRAINE) [phone] MG tablet Take 2 tablets by mouth every 6 (six) hours as needed for headache.   Yes [provider]  esomeprazole (NEXIUM) 20 MG capsule Take 20 mg by mouth daily at 12 noon.   Yes [provider]  fexofenadine (ALLEGRA) 180 MG tablet Take 180 mg by mouth daily.   Yes [provider]  ondansetron (ZOFRAN) 4 MG tablet Take 4 mg by mouth every 8 (eight) hours as needed for nausea or vomiting.   Yes [provider]    Family History Family History  Problem Relation Age of Onset  . Migraines Mother    . Epilepsy Mother   . Neurofibromatosis Father     Social History Social History   Tobacco Use  . Smoking status: Never Smoker  . Smokeless tobacco: Never Used  Substance Use Topics  . Alcohol use: No  . Drug use: No     Allergies   Amoxicillin; Rocephin [ceftriaxone]; Zithromax [azithromycin]; and Erythromycin   Review of Systems Review of Systems  Constitutional: Negative for chills and fever.  Gastrointestinal: Negative for nausea and vomiting.  Musculoskeletal: Positive for arthralgias and myalgias. Negative for back pain, gait problem, joint swelling, neck pain and neck stiffness.  Skin: Negative for color change, pallor, rash and wound.  Neurological: Negative for dizziness, weakness and numbness.     Physical Exam Updated Vital Signs BP (!) 134/95 (BP Location: Left Arm)   Pulse (!) 104   Temp 98 F (36.7 C) (Oral)   Resp 20   Ht 5' (1.524 m)   Wt 66.7 kg (147 lb)   LMP 04/29/2017   SpO2 98%   BMI 28.71 kg/m   Physical Exam  Constitutional: She appears well-developed and well-nourished. No distress.  Afebrile, nontoxic appearing in no acute distress, sitting comfortably in bed  HENT:  Head: Atraumatic.  Eyes: Conjunctivae and EOM are normal. Right eye exhibits no discharge. Left eye exhibits no discharge.  Neck: Normal range  of motion.  Cardiovascular: Normal rate, regular rhythm, normal heart sounds and intact distal pulses.  Pulmonary/Chest: Effort normal. No stridor. No respiratory distress. She has no wheezes. She has no rales.  Abdominal: She exhibits no distension.  Musculoskeletal: Normal range of motion. She exhibits no edema, tenderness or deformity.  Full range of motion at the ankle without tenderness to palpation, edema, erythema or warmth. Negative anterior drawer, negative Thompson(Achilles intact).  Joint is stable. Strong dorsalis pedis pulses  Neurological: She is alert. No sensory deficit. She exhibits normal muscle tone.    Neurovascularly intact.  5/5 strength to plantar flexion dorsiflexion  Skin: Skin is warm and dry. No rash noted. She is not diaphoretic. No erythema. No pallor.  Psychiatric: She has a normal mood and affect. Her behavior is normal.  Nursing note and vitals reviewed.    ED Treatments / Results  Labs (all labs ordered are listed, but only abnormal results are displayed) Labs Reviewed - No data to display  EKG  EKG Interpretation None       Radiology Dg Foot Complete Right  Result Date: 05/12/2017 CLINICAL DATA:  Cramping. EXAM: RIGHT FOOT COMPLETE - 3+ VIEW COMPARISON:  No prior. FINDINGS: No acute bony or joint abnormality identified. No evidence of fracture dislocation. IMPRESSION: No acute abnormality. Electronically Signed   By: Maisie Fus  Register   On: 05/12/2017 12:17    Procedures Procedures (including critical care time)  Medications Ordered in ED Medications - No data to display   Initial Impression / Assessment and Plan / ED Course  I have reviewed the triage vital signs and the nursing notes.  Pertinent labs & imaging results that were available during my care of the patient were reviewed by me and considered in my medical decision making (see chart for details).    Patient presents with sudden onset right foot pain, no tenderness palpation, erythema, swelling, warmth.  Plain films negative for any acute abnormalities. Was provided with ankle support.  Discharge home with close follow-up with PCP and podiatry as needed. Advised cushioned shoes.  No acute findings on exam, patient is well-appearing nontoxic. Return precautions discussed patient understands and agrees with discharge plan. Final Clinical Impressions(s) / ED Diagnoses   Final diagnoses:  Foot pain, right    ED Discharge Orders    None       Gregary Cromer 05/12/17 1238    Terrilee Files, MD 05/12/17 [phone]

## 2017-05-12 NOTE — ED Triage Notes (Signed)
Onset last night started as a cramp feeling, today could not put pressure on the right foot without pain.

## 2017-05-12 NOTE — Discharge Instructions (Signed)
As discussed, wear good support shoes and insoles.  Wear ankle brace to help with support.  Follow-up with podiatry if symptoms persist beyond a week.  Follow up with your primary care provider in a week. Take ibuprofen or Tylenol as needed for pain.  Gentle stretches.  Return if symptoms worsen, redness, swelling, worsening pain, warmth, fever, chills or other new concerning symptoms in the meantime.

## 2018-03-24 DIAGNOSIS — N946 Dysmenorrhea, unspecified: Secondary | ICD-10-CM | POA: Insufficient documentation

## 2018-03-28 ENCOUNTER — Encounter (HOSPITAL_COMMUNITY): Payer: Self-pay | Admitting: Emergency Medicine

## 2018-03-28 ENCOUNTER — Other Ambulatory Visit: Payer: Self-pay

## 2018-03-28 ENCOUNTER — Emergency Department (HOSPITAL_COMMUNITY): Payer: Self-pay

## 2018-03-28 ENCOUNTER — Emergency Department (HOSPITAL_COMMUNITY)
Admission: EM | Admit: 2018-03-28 | Discharge: 2018-03-28 | Disposition: A | Discharge status: Home / Self Care | Payer: Self-pay | Attending: Emergency Medicine | Admitting: Emergency Medicine

## 2018-03-28 DIAGNOSIS — R102 Pelvic and perineal pain: Secondary | ICD-10-CM | POA: Insufficient documentation

## 2018-03-28 DIAGNOSIS — J40 Bronchitis, not specified as acute or chronic: Secondary | ICD-10-CM | POA: Insufficient documentation

## 2018-03-28 DIAGNOSIS — Z79899 Other long term (current) drug therapy: Secondary | ICD-10-CM | POA: Insufficient documentation

## 2018-03-28 DIAGNOSIS — N939 Abnormal uterine and vaginal bleeding, unspecified: Secondary | ICD-10-CM | POA: Insufficient documentation

## 2018-03-28 LAB — CBC WITH DIFFERENTIAL/PLATELET
Abs Immature Granulocytes: 0.03 10*3/uL (ref 0.00–0.07)
Basophils Absolute: 0 10*3/uL (ref 0.0–0.1)
Basophils Relative: 0 %
Eosinophils Absolute: 0 10*3/uL (ref 0.0–0.5)
Eosinophils Relative: 0 %
HCT: 37.8 % (ref 36.0–46.0)
Hemoglobin: 12 g/dL (ref 12.0–15.0)
IMMATURE GRANULOCYTES: 0 %
Lymphocytes Relative: 6 %
Lymphs Abs: 0.5 10*3/uL — ABNORMAL LOW (ref 0.7–4.0)
MCH: 28.5 pg (ref 26.0–34.0)
MCHC: 31.7 g/dL (ref 30.0–36.0)
MCV: 89.8 fL (ref 80.0–100.0)
Monocytes Absolute: 0.4 10*3/uL (ref 0.1–1.0)
Monocytes Relative: 5 %
Neutro Abs: 8.1 10*3/uL — ABNORMAL HIGH (ref 1.7–7.7)
Neutrophils Relative %: 89 %
PLATELETS: 400 10*3/uL (ref 150–400)
RBC: 4.21 MIL/uL (ref 3.87–5.11)
RDW: 12.9 % (ref 11.5–15.5)
WBC: 9.1 10*3/uL (ref 4.0–10.5)
nRBC: 0 % (ref 0.0–0.2)

## 2018-03-28 LAB — BASIC METABOLIC PANEL
Anion gap: 8 (ref 5–15)
BUN: 18 mg/dL (ref 6–20)
CO2: 22 mmol/L (ref 22–32)
Calcium: 8.6 mg/dL — ABNORMAL LOW (ref 8.9–10.3)
Chloride: 106 mmol/L (ref 98–111)
Creatinine, Ser: 0.63 mg/dL (ref 0.44–1.00)
GFR calc Af Amer: >60 mL/min (ref >60)
Glucose, Bld: 106 mg/dL — ABNORMAL HIGH (ref 70–99)
Potassium: 3.7 mmol/L (ref 3.5–5.1)
Sodium: 136 mmol/L (ref 135–145)

## 2018-03-28 LAB — HCG, QUANTITATIVE, PREGNANCY: hCG, Beta Chain, Quant, S: <1 m[IU]/mL (ref <5)

## 2018-03-28 MED ORDER — ALBUTEROL SULFATE HFA 108 (90 BASE) MCG/ACT IN AERS: 1.0000 | INHALATION_SPRAY | Freq: Four times a day (QID) | RESPIRATORY_TRACT | 0 refills | Status: DC | PRN

## 2018-03-28 MED ORDER — HYDROCODONE-HOMATROPINE 5-1.5 MG/5ML PO SYRP: 5.0000 mL | ORAL_SOLUTION | Freq: Four times a day (QID) | ORAL | 0 refills | Status: DC | PRN

## 2018-03-28 MED ORDER — MEGESTROL ACETATE 40 MG PO TABS: 40.0000 mg | ORAL_TABLET | Freq: Every day | ORAL | 0 refills | Status: DC

## 2018-03-28 NOTE — Discharge Instructions (Addendum)
Prescription for a hormone called Megace, cough syrup, inhaler.  You will need to follow-up with preferably an OB/GYN type of doctor.  Several phone numbers given.

## 2018-03-28 NOTE — ED Triage Notes (Addendum)
Pt c/o of vaginal bleeding since Sunday and states it "violent bleeding" with cramping.  Pt states she is bleeding through a pad every two hours.

## 2018-03-30 NOTE — ED Provider Notes (Signed)
[facility] Provider [facility]   CSN: [id number] Arrival date & time: 03/28/18  1237     History   Chief Complaint Chief Complaint  Patient presents with  . Vaginal Bleeding    HPI Anna Willis is a 37 y.o. female.  Patient reports significant vaginal bleeding for 3 days with associated lower abdominal cramping.  This has never happened before.  She is in a non-heterosexual relationship.  No possibility of pregnancy.  No weakness, syncope, chest pain, dyspnea.  Severity of symptoms is moderate.  Nothing makes symptoms better or worse.  Review of systems positive for persistent cough.     Past Medical History:  Diagnosis Date  . Acid reflux   . Anxiety   . Migraine   . Neurofibromatosis (HCC)   . Scoliosis   . Seasonal allergies   . Tachycardia     There are no active problems to display for this patient.   Past Surgical History:  Procedure Laterality Date  . DILATION AND CURETTAGE OF UTERUS    . TOE AMPUTATION     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      OB History   No obstetric history on file.      Home Medications    Prior to Admission medications   Medication Sig Start Date End Date Taking? Authorizing Provider  Acetaminophen-Caff-Pyrilamine (MIDOL COMPLETE) 500-60-15 MG TABS Take by mouth.   Yes [provider]  azithromycin (ZITHROMAX) 250 MG tablet Take 250-500 mg by mouth See admin instructions. Starting on 03/26/2018 take 500mg  then take 250mg  daily for 4 days   Yes [provider]  diphenhydrAMINE HCl, Sleep, (ZZZQUIL) 25 MG CAPS Take 1-2 capsules by mouth at bedtime as needed (for sleep).   Yes [provider]  ibuprofen (ADVIL,MOTRIN) 200 MG tablet Take 400-600 mg by mouth every 6 (six) hours as needed for mild pain or moderate pain.   Yes [provider]  omeprazole (PRILOSEC) 20 MG capsule Take 20 mg by mouth daily.   Yes [provider]  predniSONE (DELTASONE) 10 MG tablet Take 10  mg by mouth See admin instructions. Tapered dose: 2 tablets for 1 day, then 1 tablet for 2 days, then stop   Yes [provider]  albuterol (PROVENTIL HFA;VENTOLIN HFA) 108 (90 Base) MCG/ACT inhaler Inhale 1-2 puffs into the lungs every 6 (six) hours as needed for wheezing or shortness of breath. 03/28/18   Donnetta Hutchingook, Nollie Shiflett, MD  HYDROcodone-homatropine [facility](HYCODAN) 5-1.5 MG/5ML syrup Take 5 mLs by mouth every 6 (six) hours as needed for cough. 03/28/18   Donnetta Hutchingook, Rashema Seawright, MD  megestrol (MEGACE) 40 MG tablet Take 1 tablet (40 mg total) by mouth daily. 3 tablets for 3 days, 2 tablets for 3 days, 1 tablet for 3 days 03/28/18   Donnetta Hutchingook, Ovadia Lopp, MD    Family History Family History  Problem Relation Age of Onset  . Migraines Mother   . Epilepsy Mother   . Neurofibromatosis Father     Social History Social History   Tobacco Use  . Smoking status: Never Smoker  . Smokeless tobacco: Never Used  Substance Use Topics  . Alcohol use: No  . Drug use: No     Allergies   Amoxicillin; Rocephin [ceftriaxone]; Zithromax [azithromycin]; and Erythromycin   Review of Systems Review of Systems  All other systems reviewed and are negative.    Physical Exam Updated Vital Signs BP 122/80   Pulse (!) 105   Temp 97.9 F (36.6  C) (Temporal)   Resp 18   Ht 5' (1.524 m)   Wt 72.6 kg   LMP 03/26/2018   SpO2 98%   BMI 31.25 kg/m   Physical Exam Vitals signs and nursing note reviewed.  Constitutional:      Appearance: She is well-developed.  HENT:     Head: Normocephalic and atraumatic.  Eyes:     Conjunctiva/sclera: Conjunctivae normal.  Neck:     Musculoskeletal: Neck supple.  Cardiovascular:     Rate and Rhythm: Normal rate and regular rhythm.  Pulmonary:     Effort: Pulmonary effort is normal.     Breath sounds: Normal breath sounds.  Abdominal:     General: Bowel sounds are normal.     Palpations: Abdomen is soft.  Genitourinary:    Comments: No gross bleeding in the  ED Musculoskeletal: Normal range of motion.  Skin:    General: Skin is warm and dry.  Neurological:     Mental Status: She is alert and oriented to person, place, and time.  Psychiatric:        Behavior: Behavior normal.      ED Treatments / Results  Labs (all labs ordered are listed, but only abnormal results are displayed) Labs Reviewed  CBC WITH DIFFERENTIAL/PLATELET - Abnormal; Notable for the following components:      Result Value   Neutro Abs 8.1 (*)    Lymphs Abs 0.5 (*)    All other components within normal limits  BASIC METABOLIC PANEL - Abnormal; Notable for the following components:   Glucose, Bld 106 (*)    Calcium 8.6 (*)    All other components within normal limits  HCG, QUANTITATIVE, PREGNANCY    EKG None  Radiology US Transvaginal Non-ob  Result Date: 03/28/2018 CLINICAL DATA:  Right adnexal pain. Heavy vaginal bleeding for the past 3 days, negative pregnancy test. EXAM: TRANSABDOMINAL AND TRANSVAGINAL ULTRASOUND OF PELVIS DOPPLER ULTRASOUND OF OVARIES TECHNIQUE: Both transabdominal and transvaginal ultrasound examinations of the pelvis were performed. Transabdominal technique was performed for global imaging of the pelvis including uterus, ovaries, adnexal regions, and pelvic cul-de-sac. It was necessary to proceed with endovaginal exam following the transabdominal exam to visualize the ovaries. Color and duplex Doppler ultrasound was utilized to evaluate blood flow to the ovaries. COMPARISON:  None. FINDINGS: Uterus Measurements: 8.3 x 4.2 x 4.4 cm = volume: 79 mL. The uterus is retroverted. The myometrial echotexture is normal. There is a fibroid present in the right aspect of the fundus measuring approximately 8 mm in diameter. Endometrium Thickness: 12.8 mm. No abnormal endometrial fluid collections or endometrial masses are observed. Right ovary Measurements: 2.2 x 3.3 x 1.7 cm = volume: 6.2 mL. Normal appearance/no adnexal mass. Left ovary Measurements: 2.8  x 2.8 x 1.5 cm = volume: 6.0 mL. Normal appearance/no adnexal mass. Pulsed Doppler evaluation of both ovaries demonstrates normal low-resistance arterial and venous waveforms. Other findings There is a trace of free pelvic fluid. IMPRESSION: The endometrial stripe is thickened at 12.8 mm. No discrete mass is observed. Correlation as to the nature of the patient's usual periods is needed. Reportedly the patient's last normal menstrual period began 4 days ago. If bleeding remains unresponsive to hormonal or medical therapy, sonohysterogram should be considered for focal lesion work-up. (Ref: Radiological Reasoning: Algorithmic Workup of Abnormal Vaginal Bleeding with Endovaginal Sonography and Sonohysterography. AJR 2008; 161:W96-04) Tiny uterine fibroid but otherwise normal appearing uterus. Normal appearing ovaries and adnexal regions. No evidence of ovarian torsion. Electronically Signed  By: David  [place] M.D.   On: 03/28/2018 16:15   US Pelvis Complete  Result Date: 03/28/2018 CLINICAL DATA:  Right adnexal pain. Heavy vaginal bleeding for the past 3 days, negative pregnancy test. EXAM: TRANSABDOMINAL AND TRANSVAGINAL ULTRASOUND OF PELVIS DOPPLER ULTRASOUND OF OVARIES TECHNIQUE: Both transabdominal and transvaginal ultrasound examinations of the pelvis were performed. Transabdominal technique was performed for global imaging of the pelvis including uterus, ovaries, adnexal regions, and pelvic cul-de-sac. It was necessary to proceed with endovaginal exam following the transabdominal exam to visualize the ovaries. Color and duplex Doppler ultrasound was utilized to evaluate blood flow to the ovaries. COMPARISON:  None. FINDINGS: Uterus Measurements: 8.3 x 4.2 x 4.4 cm = volume: 79 mL. The uterus is retroverted. The myometrial echotexture is normal. There is a fibroid present in the right aspect of the fundus measuring approximately 8 mm in diameter. Endometrium Thickness: 12.8 mm. No abnormal endometrial fluid  collections or endometrial masses are observed. Right ovary Measurements: 2.2 x 3.3 x 1.7 cm = volume: 6.2 mL. Normal appearance/no adnexal mass. Left ovary Measurements: 2.8 x 2.8 x 1.5 cm = volume: 6.0 mL. Normal appearance/no adnexal mass. Pulsed Doppler evaluation of both ovaries demonstrates normal low-resistance arterial and venous waveforms. Other findings There is a trace of free pelvic fluid. IMPRESSION: The endometrial stripe is thickened at 12.8 mm. No discrete mass is observed. Correlation as to the nature of the patient's usual periods is needed. Reportedly the patient's last normal menstrual period began 4 days ago. If bleeding remains unresponsive to hormonal or medical therapy, sonohysterogram should be considered for focal lesion work-up. (Ref: Radiological Reasoning: Algorithmic Workup of Abnormal Vaginal Bleeding with Endovaginal Sonography and Sonohysterography. AJR 2008; 161:W96-04) Tiny uterine fibroid but otherwise normal appearing uterus. Normal appearing ovaries and adnexal regions. No evidence of ovarian torsion. Electronically Signed   By: David  [place] M.D.   On: 03/28/2018 16:15   [place] Art/ven Flow Abd Pelv Doppler  Result Date: 03/28/2018 CLINICAL DATA:  Right adnexal pain. Heavy vaginal bleeding for the past 3 days, negative pregnancy test. EXAM: TRANSABDOMINAL AND TRANSVAGINAL ULTRASOUND OF PELVIS DOPPLER ULTRASOUND OF OVARIES TECHNIQUE: Both transabdominal and transvaginal ultrasound examinations of the pelvis were performed. Transabdominal technique was performed for global imaging of the pelvis including uterus, ovaries, adnexal regions, and pelvic cul-de-sac. It was necessary to proceed with endovaginal exam following the transabdominal exam to visualize the ovaries. Color and duplex Doppler ultrasound was utilized to evaluate blood flow to the ovaries. COMPARISON:  None. FINDINGS: Uterus Measurements: 8.3 x 4.2 x 4.4 cm = volume: 79 mL. The uterus is retroverted. The  myometrial echotexture is normal. There is a fibroid present in the right aspect of the fundus measuring approximately 8 mm in diameter. Endometrium Thickness: 12.8 mm. No abnormal endometrial fluid collections or endometrial masses are observed. Right ovary Measurements: 2.2 x 3.3 x 1.7 cm = volume: 6.2 mL. Normal appearance/no adnexal mass. Left ovary Measurements: 2.8 x 2.8 x 1.5 cm = volume: 6.0 mL. Normal appearance/no adnexal mass. Pulsed Doppler evaluation of both ovaries demonstrates normal low-resistance arterial and venous waveforms. Other findings There is a trace of free pelvic fluid. IMPRESSION: The endometrial stripe is thickened at 12.8 mm. No discrete mass is observed. Correlation as to the nature of the patient's usual periods is needed. Reportedly the patient's last normal menstrual period began 4 days ago. If bleeding remains unresponsive to hormonal or medical therapy, sonohysterogram should be considered for focal lesion work-up. (Ref: Radiological Reasoning: Algorithmic Workup  of Abnormal Vaginal Bleeding with Endovaginal Sonography and Sonohysterography. AJR 2008; 161:W96-04; 191:S68-73) Tiny uterine fibroid but otherwise normal appearing uterus. Normal appearing ovaries and adnexal regions. No evidence of ovarian torsion. Electronically Signed   By: David  [place] M.D.   On: 03/28/2018 16:15    Procedures Procedures (including critical care time)  Medications Ordered in ED Medications - No data to display   Initial Impression / Assessment and Plan / ED Course  I have reviewed the triage vital signs and the nursing notes.  Pertinent labs & imaging results that were available during my care of the patient were reviewed by me and considered in my medical decision making (see chart for details).     Patient presents with dysfunctional uterine bleeding.  She is hemodynamically stable.  Pelvic ultrasound reveals a 12.8 cm endometrial stripe.  No masses were noted.  She will be referred to  OB/GYN for further follow-up.  Rx Megace as an outpatient.  Additional Rx for albuterol inhaler and Hycodan cough syrup.  Final Clinical Impressions(s) / ED Diagnoses   Final diagnoses:  Abnormal uterine bleeding (AUB)  Bronchitis    ED Discharge Orders         Ordered    megestrol (MEGACE) 40 MG tablet  Daily     03/28/18 1736    albuterol (PROVENTIL HFA;VENTOLIN HFA) 108 (90 Base) MCG/ACT inhaler  Every 6 hours PRN     03/28/18 1736    HYDROcodone-homatropine (HYCODAN) 5-1.5 MG/5ML syrup  Every 6 hours PRN     03/28/18 1736           Donnetta Hutchingook, Afnan Emberton, MD 03/30/18 1038

## 2018-04-19 ENCOUNTER — Telehealth: Payer: Self-pay | Admitting: Obstetrics & Gynecology

## 2018-04-19 NOTE — Telephone Encounter (Signed)
Patient states she was in the ER on 12/17 for AUB. She is on her fourth period in the last 45 days, last one ended 11 days ago and she has started bleeding again today. She was given an 18 tablet prescription for Megace, took the medication, stopped bleeding but is now bleeding again since she is out of the medication.  She is scheduled to see you on 1/17.  Would you refill the Megace until she is seen?

## 2018-04-19 NOTE — Telephone Encounter (Signed)
Patient's partner, Khodi Quito called, stated that the patient has been to the ER for bleeding.  Marylene Land gave the patient the first available new pt appt we have, which is 04/28/2018.  The partner stated that the patient is profusely bleeding and needs an appointment ASAP.  Please advise.  [phone]

## 2018-04-20 ENCOUNTER — Telehealth: Payer: Self-pay | Admitting: Obstetrics & Gynecology

## 2018-04-20 ENCOUNTER — Telehealth: Payer: Self-pay | Admitting: *Deleted

## 2018-04-20 MED ORDER — MEGESTROL ACETATE 40 MG PO TABS: ORAL_TABLET | ORAL | 3 refills | Status: DC

## 2018-04-20 NOTE — Telephone Encounter (Signed)
refilled 

## 2018-04-20 NOTE — Telephone Encounter (Signed)
Patient informed Megace sent to pharmacy.

## 2018-04-28 ENCOUNTER — Encounter: Payer: Self-pay | Admitting: Obstetrics & Gynecology

## 2018-04-28 ENCOUNTER — Ambulatory Visit (INDEPENDENT_AMBULATORY_CARE_PROVIDER_SITE_OTHER): Payer: Self-pay | Admitting: Obstetrics & Gynecology

## 2018-04-28 VITALS — BP 139/85 | HR 101 | Ht 60.0 in | Wt 160.5 lb

## 2018-04-28 DIAGNOSIS — N939 Abnormal uterine and vaginal bleeding, unspecified: Secondary | ICD-10-CM

## 2018-04-28 DIAGNOSIS — N938 Other specified abnormal uterine and vaginal bleeding: Secondary | ICD-10-CM

## 2018-04-28 LAB — POCT HEMOGLOBIN: HEMOGLOBIN: 11.5 g/dL (ref 11–14.6)

## 2018-04-28 NOTE — Progress Notes (Signed)
Chief Complaint  Patient presents with  . having vaginal bleeding every 11 days      37 y.o. G1P0010 Patient's last menstrual period was 04/14/2018. The current method of family planning is none.  Outpatient Encounter Medications as of 04/28/2018  Medication Sig Note  . Acetaminophen-Caff-Pyrilamine (MIDOL COMPLETE) 500-60-15 MG TABS Take by mouth.   Marland Kitchen. albuterol (PROVENTIL HFA;VENTOLIN HFA) 108 (90 Base) MCG/ACT inhaler Inhale 1-2 puffs into the lungs every 6 (six) hours as needed for wheezing or shortness of breath.   . diphenhydrAMINE HCl, Sleep, (ZZZQUIL) 25 MG CAPS Take 1-2 capsules by mouth at bedtime as needed (for sleep).   Marland Kitchen. esomeprazole (NEXIUM) 20 MG capsule Take 20 mg by mouth daily at 12 noon.   Marland Kitchen. HYDROcodone-homatropine (HYCODAN) 5-1.5 MG/5ML syrup Take 5 mLs by mouth every 6 (six) hours as needed for cough.   Marland Kitchen. ibuprofen (ADVIL,MOTRIN) 200 MG tablet Take 400-600 mg by mouth every 6 (six) hours as needed for mild pain or moderate pain.   . megestrol (MEGACE) 40 MG tablet 3 tablets a day for 5 days, 2 tablets a day for 5 days then 1 tablet daily   . [DISCONTINUED] azithromycin (ZITHROMAX) 250 MG tablet Take 250-500 mg by mouth See admin instructions. Starting on 03/26/2018 take 500mg  then take 250mg  daily for 4 days   . [DISCONTINUED] megestrol (MEGACE) 40 MG tablet Take 1 tablet (40 mg total) by mouth daily. 3 tablets for 3 days, 2 tablets for 3 days, 1 tablet for 3 days   . [DISCONTINUED] omeprazole (PRILOSEC) 20 MG capsule Take 20 mg by mouth daily.   . [DISCONTINUED] predniSONE (DELTASONE) 10 MG tablet Take 10 mg by mouth See admin instructions. Tapered dose: 2 tablets for 1 day, then 1 tablet for 2 days, then stop 03/28/2018: This is leftover medications from a previous prescription   No facility-administered encounter medications on file as of 04/28/2018.     Subjective Pt is [facility] DUB sonogram normal Stopped on the megestrol  therapy Bleeding was heavy with large clots and associated cramping  Past Medical History:  Diagnosis Date  . Acid reflux   . Anxiety   . Migraine   . Neurofibromatosis (HCC)   . Scoliosis   . Seasonal allergies   . Tachycardia     Past Surgical History:  Procedure Laterality Date  . DILATION AND CURETTAGE OF UTERUS    . TOE AMPUTATION     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   . WISDOM TOOTH EXTRACTION      OB History    Gravida  1   Para      Term      Preterm      AB  1   Living        SAB  1   TAB      Ectopic      Multiple      Live Births              Allergies  Allergen Reactions  . Amoxicillin     Not feeling well  . Rocephin [Ceftriaxone] Nausea And Vomiting  . Erythromycin Rash    Social History   Socioeconomic History  . Marital status: Single    Spouse name: Not on file  . Number of children: 0  . Years of education: 6814  . Highest education level: Not on file  Occupational History  . Occupation: Hair  dresser  Social Needs  . Financial resource strain: Not on file  . Food insecurity:    Worry: Not on file    Inability: Not on file  . Transportation needs:    Medical: Not on file    Non-medical: Not on file  Tobacco Use  . Smoking status: Former Smoker    Years: 10.00    Types: Cigarettes  . Smokeless tobacco: Never Used  Substance and Sexual Activity  . Alcohol use: No  . Drug use: No  . Sexual activity: Yes    Comment: has female partner  Lifestyle  . Physical activity:    Days per week: Not on file    Minutes per session: Not on file  . Stress: Not on file  Relationships  . Social connections:    Talks on phone: Not on file    Gets together: Not on file    Attends religious service: Not on file    Active member of club or organization: Not on file    Attends meetings of clubs or organizations: Not on file    Relationship status: Not on file  Other Topics Concern  . Not on file  Social History Narrative    Lives at home with her partner, Emalia.   Right-handed.   0.5 cup coffee and 1 soda per day.    Family History  Problem Relation Age of Onset  . Migraines Mother   . Epilepsy Mother   . Endometriosis Mother        had hyst at age 4  . Neurofibromatosis Father   . Dementia Maternal Grandmother   . Kidney disease Maternal Grandmother   . Other Maternal Grandmother        shingles  . Heart attack Maternal Grandfather   . Breast cancer Paternal Aunt   . Breast cancer Paternal Aunt   . Breast cancer Paternal Aunt     Medications:       Current Outpatient Medications:  .  Acetaminophen-Caff-Pyrilamine (MIDOL COMPLETE) 500-60-15 MG TABS, Take by mouth., Disp: , Rfl:  .  albuterol (PROVENTIL HFA;VENTOLIN HFA) 108 (90 Base) MCG/ACT inhaler, Inhale 1-2 puffs into the lungs every 6 (six) hours as needed for wheezing or shortness of breath., Disp: 1 Inhaler, Rfl: 0 .  diphenhydrAMINE HCl, Sleep, (ZZZQUIL) 25 MG CAPS, Take 1-2 capsules by mouth at bedtime as needed (for sleep)., Disp: , Rfl:  .  esomeprazole (NEXIUM) 20 MG capsule, Take 20 mg by mouth daily at 12 noon., Disp: , Rfl:  .  HYDROcodone-homatropine (HYCODAN) 5-1.5 MG/5ML syrup, Take 5 mLs by mouth every 6 (six) hours as needed for cough., Disp: 120 mL, Rfl: 0 .  ibuprofen (ADVIL,MOTRIN) 200 MG tablet, Take 400-600 mg by mouth every 6 (six) hours as needed for mild pain or moderate pain., Disp: , Rfl:  .  megestrol (MEGACE) 40 MG tablet, 3 tablets a day for 5 days, 2 tablets a day for 5 days then 1 tablet daily, Disp: 45 tablet, Rfl: 3  Objective Blood pressure 139/85, pulse (!) 101, height 5' (1.524 m), weight 160 lb 8 oz (72.8 kg), last menstrual period 04/14/2018.  General WDWN female NAD Vulva:  normal appearing vulva with no masses, tenderness or lesions Vagina:  normal mucosa Cervix:  no cervical motion tenderness and no lesions Uterus:  normal size, contour, position, consistency, mobility, non-tender and  retroverted Adnexa: ovaries:present,  normal adnexa in size, nontender and no masses   Pertinent ROS No burning with urination, frequency  or urgency No nausea, vomiting or diarrhea Nor fever chills or other constitutional symptoms   Labs or studies Reviewed labs and scans from ED    Impression Diagnoses this Encounter::   ICD-10-CM   1. DUB (dysfunctional uterine bleeding) N93.8   2. Abnormal vaginal bleeding N93.9 POCT hemoglobin    Established relevant diagnosis(es):   Plan/Recommendations: No orders of the defined types were placed in this encounter.   Labs or Scans Ordered: Orders Placed This Encounter  Procedures  . POCT hemoglobin    Management:: >megestrol algorithm for 3 months >will then withdraw and see how she does if this is the "new normal" vs a short term dyssynchrony  Follow up Return in about 4 months (around 08/27/2018) for Follow up, with Dr Despina Hidden.      All questions were answered.

## 2018-07-19 ENCOUNTER — Telehealth: Payer: Self-pay | Admitting: Obstetrics & Gynecology

## 2018-07-19 ENCOUNTER — Telehealth: Payer: Self-pay | Admitting: *Deleted

## 2018-07-19 NOTE — Telephone Encounter (Signed)
She can continue the megestrol if she likes

## 2018-07-19 NOTE — Telephone Encounter (Signed)
Pt is wanting to talk to someone about the bleeding she is still having, advised pt most nurses have gone for the day and it would be tomorrow before she gets a call back

## 2018-07-19 NOTE — Telephone Encounter (Signed)
Patient states she is taking Megace daily but has been having spotting daily for about the last month.  She is having hot flashes as well. She is to stop the Megace on 4/17 but is nervous about stopping as she is doing nursing school online and is concerned about having severe cramping and bleeding when she stops.  Please advise.

## 2018-07-20 ENCOUNTER — Telehealth: Payer: Self-pay | Admitting: *Deleted

## 2018-07-20 NOTE — Telephone Encounter (Signed)
Patient informed to continue taking the Megace if she likes per Dr Despina Hidden.  Verbalized understanding.

## 2018-08-22 ENCOUNTER — Telehealth: Payer: Self-pay | Admitting: Obstetrics & Gynecology

## 2018-08-22 NOTE — Telephone Encounter (Signed)
Patient has an upcoming appointment, she has questions about medication she's on and if she needs to continue or stop it before her appointment.  [phone]

## 2018-08-23 NOTE — Telephone Encounter (Signed)
Pt wants to know if she needs to stop her Megace prior to her next visit?

## 2018-08-23 NOTE — Telephone Encounter (Signed)
Pt informed that she can continue megace and could discuss more with Dr. Despina Hidden at her visit.

## 2018-08-23 NOTE — Telephone Encounter (Signed)
No not if it is working weel she can just stay on it per our conversations noted in the notes earlier

## 2018-08-31 ENCOUNTER — Ambulatory Visit: Payer: Self-pay | Admitting: Obstetrics & Gynecology

## 2018-09-18 MED FILL — METOPROLOL TARTRATE 25 MG T: 25 | 30 days supply | Qty: 60 | Fill #0

## 2018-10-03 ENCOUNTER — Telehealth: Payer: Self-pay | Admitting: Adult Health

## 2018-10-03 NOTE — Telephone Encounter (Signed)
Patient called, needs a refill on Megestrol 40mg .  [place]

## 2018-10-04 ENCOUNTER — Telehealth: Payer: Self-pay | Admitting: Obstetrics & Gynecology

## 2018-10-04 MED ORDER — MEGESTROL ACETATE 40 MG PO TABS: ORAL_TABLET | ORAL | 3 refills | Status: DC

## 2018-10-04 MED FILL — MEGESTROL 40 MG TABLET: 40 | 30 days supply | Qty: 45 | Fill #0

## 2018-10-12 MED FILL — SUMAtriptan SUCCINATE 100 M: 100 | 30 days supply | Qty: 9 | Fill #0

## 2018-11-01 ENCOUNTER — Other Ambulatory Visit: Payer: Self-pay | Admitting: Adult Health

## 2018-11-02 MED FILL — METOPROLOL SUCCINATE ER 25: 25 | 90 days supply | Qty: 90 | Fill #0

## 2018-11-06 MED FILL — NAPROXEN 500 MG TABLET: 500 | 15 days supply | Qty: 30 | Fill #0

## 2018-11-13 ENCOUNTER — Ambulatory Visit (INDEPENDENT_AMBULATORY_CARE_PROVIDER_SITE_OTHER): Payer: No Typology Code available for payment source | Admitting: Obstetrics and Gynecology

## 2018-11-13 ENCOUNTER — Other Ambulatory Visit: Payer: Self-pay

## 2018-11-13 ENCOUNTER — Encounter: Payer: Self-pay | Admitting: Obstetrics and Gynecology

## 2018-11-13 VITALS — BP 117/78 | HR 90 | Ht 61.0 in | Wt 167.4 lb

## 2018-11-13 DIAGNOSIS — Z3202 Encounter for pregnancy test, result negative: Secondary | ICD-10-CM | POA: Diagnosis not present

## 2018-11-13 DIAGNOSIS — N920 Excessive and frequent menstruation with regular cycle: Secondary | ICD-10-CM

## 2018-11-13 LAB — POCT URINE PREGNANCY: Preg Test, Ur: NEGATIVE

## 2018-11-13 MED ORDER — TRANEXAMIC ACID 650 MG PO TABS: 1300.0000 mg | ORAL_TABLET | Freq: Three times a day (TID) | ORAL | 2 refills | Status: DC

## 2018-11-13 MED ORDER — ONDANSETRON 4 MG PO TBDP: 4.0000 mg | ORAL_TABLET | Freq: Four times a day (QID) | ORAL | 0 refills | Status: DC | PRN

## 2018-11-13 MED FILL — TRANEXAMIC ACID 650 MG TAB: 650 | 5 days supply | Qty: 30 | Fill #0

## 2018-11-13 MED FILL — ONDANSETRON ODT 4 MG TABLET: 4 | 5 days supply | Qty: 20 | Fill #0

## 2018-11-13 NOTE — Progress Notes (Signed)
Ms Anna Willis presents to discuss possible IUD insertion for heavy cycles. Pt reports regular cycles last yr. However as year progressed cycles became heavier with cramps and clots plus longer as well. Was seen in ER last December, started on Megace. Had f/u appt with Dr Elonda Husky in Jan 2020. Bleeding had stopped. She has occasional BTB with the Megace but has had a 25 # weight gain this year. Her PCP suggested an IUD. She has problems with migraine HA and tachycardia  She is interested in something the would have little to no impact on weight and cycle control  PE AF VSS Lungs clear Heart RRR Abd soft + BS  A/P Menorrhagia  Tx options reviewed with pt. Following discussion pt desires to forgo IUD placement presently. Would like to see what her cycles will be like without medications. Will try Lysetda for heavy flow. She will work on diet and exercise for wt loss. She will f/u PRN

## 2018-12-06 MED FILL — predniSONE 10 MG TABS: 10 | 15 days supply | Qty: 45 | Fill #0

## 2018-12-06 MED FILL — CYCLOBENZAPRINE 10 MG TAB: 10 | 20 days supply | Qty: 60 | Fill #0

## 2018-12-06 MED FILL — PHENTERMINE 37.5 MG TABLET: 37.5 | 90 days supply | Qty: 90 | Fill #0

## 2019-01-03 MED FILL — SUMAtriptan SUCCINATE 100 M: 100 | 30 days supply | Qty: 9 | Fill #1

## 2019-01-10 ENCOUNTER — Telehealth: Payer: Self-pay | Admitting: *Deleted

## 2019-01-10 MED FILL — MEGESTROL 40 MG TABLET: 40 | 45 days supply | Qty: 45 | Fill #1

## 2019-01-10 NOTE — Telephone Encounter (Signed)
Pt started back on Megace 120 mg today. Pt is interested in an IUD. Pt wants a female provider. Call transferred to front desk for appt. [place]

## 2019-01-10 NOTE — Telephone Encounter (Signed)
Patient states she has been bleeding for the past 16 days and today is having severe cramping.  She has restarted the Megace @ 120mg  and is wanting to know what to do next as far as long term.

## 2019-01-12 ENCOUNTER — Emergency Department (HOSPITAL_COMMUNITY)
Admission: EM | Admit: 2019-01-12 | Discharge: 2019-01-12 | Disposition: A | Discharge status: Home / Self Care | Payer: No Typology Code available for payment source | Attending: Emergency Medicine | Admitting: Emergency Medicine

## 2019-01-12 ENCOUNTER — Encounter (HOSPITAL_COMMUNITY): Payer: Self-pay | Admitting: Emergency Medicine

## 2019-01-12 ENCOUNTER — Other Ambulatory Visit: Payer: Self-pay

## 2019-01-12 DIAGNOSIS — Z87891 Personal history of nicotine dependence: Secondary | ICD-10-CM | POA: Diagnosis not present

## 2019-01-12 DIAGNOSIS — N939 Abnormal uterine and vaginal bleeding, unspecified: Secondary | ICD-10-CM | POA: Diagnosis present

## 2019-01-12 LAB — BASIC METABOLIC PANEL
Anion gap: 8 (ref 5–15)
BUN: 14 mg/dL (ref 6–20)
CO2: 21 mmol/L — ABNORMAL LOW (ref 22–32)
Calcium: 9 mg/dL (ref 8.9–10.3)
Chloride: 105 mmol/L (ref 98–111)
Creatinine, Ser: 0.65 mg/dL (ref 0.44–1.00)
GFR calc Af Amer: >60 mL/min (ref >60)
GFR calc non Af Amer: >60 mL/min (ref >60)
Glucose, Bld: 89 mg/dL (ref 70–99)
Potassium: 3.6 mmol/L (ref 3.5–5.1)
Sodium: 134 mmol/L — ABNORMAL LOW (ref 135–145)

## 2019-01-12 LAB — CBC
HCT: 37.2 % (ref 36.0–46.0)
Hemoglobin: 12.3 g/dL (ref 12.0–15.0)
MCH: 29.3 pg (ref 26.0–34.0)
MCHC: 33.1 g/dL (ref 30.0–36.0)
MCV: 88.6 fL (ref 80.0–100.0)
Platelets: 327 10*3/uL (ref 150–400)
RBC: 4.2 MIL/uL (ref 3.87–5.11)
RDW: 13.2 % (ref 11.5–15.5)
WBC: 9.3 10*3/uL (ref 4.0–10.5)
nRBC: 0 % (ref 0.0–0.2)

## 2019-01-12 MED ORDER — IBUPROFEN 400 MG PO TABS
600.0000 mg | ORAL_TABLET | Freq: Once | ORAL | Status: AC
  Administered 2019-01-12: 600 mg via ORAL
  Filled 2019-01-12: qty 2

## 2019-01-12 MED ORDER — MEDROXYPROGESTERONE ACETATE 150 MG/ML IM SUSP
300.0000 mg | Freq: Once | INTRAMUSCULAR | Status: AC
  Administered 2019-01-12: 23:00:00 300 mg via INTRAMUSCULAR
  Filled 2019-01-12: qty 2

## 2019-01-12 MED ORDER — OXYCODONE HCL 5 MG PO TABS
5.0000 mg | ORAL_TABLET | Freq: Once | ORAL | Status: AC
  Administered 2019-01-12: 5 mg via ORAL
  Filled 2019-01-12: qty 1

## 2019-01-12 MED ORDER — NAPROXEN 500 MG PO TABS: 500.0000 mg | ORAL_TABLET | Freq: Two times a day (BID) | ORAL | 0 refills | Status: DC

## 2019-01-12 MED ORDER — OXYCODONE HCL 5 MG PO TABS: 5.0000 mg | ORAL_TABLET | ORAL | 0 refills | Status: DC | PRN

## 2019-01-12 NOTE — Discharge Instructions (Addendum)
You were evaluated in the Emergency Department and after careful evaluation, we did not find any emergent condition requiring admission or further testing in the hospital.  Your exam/testing today was overall reassuring.  As discussed, please take the Megace medication as an extended taper.  120 mg for 5 days, 80 mg for 5 days, 40 mg for 5 days.  Please follow-up with the GYN doctors for further testing to determine why your bleeding is abnormal.  Please return to the Emergency Department if you experience any worsening of your condition.  We encourage you to follow up with a primary care provider.  Thank you for allowing [place] to be a part of your care.

## 2019-01-12 NOTE — ED Notes (Signed)
Pt refused pregnancy test. States she is infertile and has a wife so there is no chance of pregnancy.

## 2019-01-12 NOTE — ED Notes (Signed)
Delay on pharmacy verifying Depo injection due to high dosage.

## 2019-01-12 NOTE — ED Notes (Signed)
Notified AC to bring Depo injection.

## 2019-01-12 NOTE — ED Provider Notes (Signed)
[facility]-EMERGENCY DEPT [facility] Emergency Department Provider Note MRN:  [id number]  Arrival date & time: 01/12/19     Chief Complaint   Vaginal Bleeding   History of Present Illness   Anna Willis is a 38 y.o. year-old female with a history of neurofibromatosis, abnormal uterine bleeding presenting to the ED with chief complaint of vaginal bleeding.  Patient started her period 3 days ago, and has been extremely painful with very heavy flow of blood.  Explains that this happened back in December and she was started on Megace.  She was on Megace long-term but this caused her to gain weight.  She stopped the Megace 1 or 2 months ago.  She restarted it 3 days ago but it has not helped the bleeding.  Pain is severe, constant for 3 days, denies fever, no chest pain or shortness of breath, no vaginal discharge or foul odors.  Review of Systems  A complete 10 system review of systems was obtained and all systems are negative except as noted in the HPI and PMH.   Patient's Health History    Past Medical History:  Diagnosis Date  . Acid reflux   . Anxiety   . Migraine   . Neurofibromatosis (HCC)   . Scoliosis   . Seasonal allergies   . Tachycardia     Past Surgical History:  Procedure Laterality Date  . DILATION AND CURETTAGE OF UTERUS    . TOE AMPUTATION     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   . WISDOM TOOTH EXTRACTION      Family History  Problem Relation Age of Onset  . Migraines Mother   . Epilepsy Mother   . Endometriosis Mother        had hyst at age 7  . Neurofibromatosis Father   . Dementia Maternal Grandmother   . Kidney disease Maternal Grandmother   . Other Maternal Grandmother        shingles  . Heart attack Maternal Grandfather   . Breast cancer Paternal Aunt   . Breast cancer Paternal Aunt   . Breast cancer Paternal Aunt     Social History   Socioeconomic History  . Marital status: Single    Spouse name: Not on file  . Number of children: 0  .  Years of education: 10  . Highest education level: Not on file  Occupational History  . Occupation: Hair dresser  Social Needs  . Financial resource strain: Not on file  . Food insecurity    Worry: Not on file    Inability: Not on file  . Transportation needs    Medical: Not on file    Non-medical: Not on file  Tobacco Use  . Smoking status: Former Smoker    Years: 10.00    Types: Cigarettes  . Smokeless tobacco: Never Used  Substance and Sexual Activity  . Alcohol use: No  . Drug use: No  . Sexual activity: Yes    Comment: has female partner  Lifestyle  . Physical activity    Days per week: Not on file    Minutes per session: Not on file  . Stress: Not on file  Relationships  . Social Musician on phone: Not on file    Gets together: Not on file    Attends religious service: Not on file    Active member of club or organization: Not on file    Attends meetings of clubs or organizations: Not on file  Relationship status: Not on file  . Intimate partner violence    Fear of current or ex partner: Not on file    Emotionally abused: Not on file    Physically abused: Not on file    Forced sexual activity: Not on file  Other Topics Concern  . Not on file  Social History Narrative   Lives at home with her partner, Morrie Sheldonshley.   Right-handed.   0.5 cup coffee and 1 soda per day.     Physical Exam  Vital Signs and Nursing Notes reviewed Vitals:   01/12/19 2222 01/12/19 2227  BP: 112/74 112/74  Pulse: 96 98  Resp:  17  Temp:  99.2 F (37.3 C)  SpO2: 97% 98%    CONSTITUTIONAL: Well-appearing, NAD NEURO:  Alert and oriented x 3, no focal deficits EYES:  eyes equal and reactive ENT/NECK:  no LAD, no JVD CARDIO: Tachycardic rate, well-perfused, normal S1 and S2 PULM:  CTAB no wheezing or rhonchi GI/GU:  normal bowel sounds, non-distended, non-tender MSK/SPINE:  No gross deformities, no edema SKIN:  no rash, atraumatic PSYCH:  Appropriate speech and  behavior  Diagnostic and Interventional Summary    EKG Interpretation  Date/Time:    Ventricular Rate:    PR Interval:    QRS Duration:   QT Interval:    QTC Calculation:   R Axis:     Text Interpretation:        Labs Reviewed  BASIC METABOLIC PANEL - Abnormal; Notable for the following components:      Result Value   Sodium 134 (*)    CO2 21 (*)    All other components within normal limits  CBC    No orders to display    Medications  medroxyPROGESTERone (DEPO-PROVERA) injection 300 mg (has no administration in time range)  oxyCODONE (Oxy IR/ROXICODONE) immediate release tablet 5 mg (5 mg Oral Given 01/12/19 1955)  ibuprofen (ADVIL) tablet 600 mg (600 mg Oral Given 01/12/19 1955)     Procedures Critical Care  ED Course and Medical Decision Making  I have reviewed the triage vital signs and the nursing notes.  Pertinent labs & imaging results that were available during my care of the patient were reviewed by me and considered in my medical decision making (see below for details).  Patient explains that she is homosexual and that there is no chance she is pregnant and defers pregnancy testing today.  Favoring recurrence of abnormal uterine bleeding.  She had an ultrasound back in December with a very small fibroid but otherwise was unrevealing.  Patient is mildly tachycardic, normotensive, will check labs to ensure no significant blood loss, may need to discuss options with GYN as she is already on Megace.  Labs reassuring, tachycardia resolved, pelvic exam also reassuring, no adnexal tenderness or masses, no cervical motion tenderness, small amount of blood in the vault, no active bleeding from the cervix.  Discussed with Dr. Despina HiddenEure of GYN, who recommends extended Megace taper as well as shot of Depo-Provera here in the emergency department.  Patient will follow-up with GYN.  Elmer SowMichael M. Pilar PlateBero, MD [facility] [email or web address]   Final Clinical Impressions(s) / ED Diagnoses     ICD-10-CM   1. Abnormal uterine bleeding (AUB)  N93.9     ED Discharge Orders    None      Discharge Instructions Discussed with and Provided to Patient:   Discharge Instructions     You were evaluated in  the Emergency Department and after careful evaluation, we did not find any emergent condition requiring admission or further testing in the hospital.  Your exam/testing today was overall reassuring.  As discussed, please take the Megace medication as an extended taper.  120 mg for 5 days, 80 mg for 5 days, 40 mg for 5 days.  Please follow-up with the GYN doctors for further testing to determine why your bleeding is abnormal.  Please return to the Emergency Department if you experience any worsening of your condition.  We encourage you to follow up with a primary care provider.  Thank you for allowing [place] to be a part of your care.        Maudie Flakes, MD 01/12/19 2251

## 2019-01-12 NOTE — ED Triage Notes (Signed)
Pt with vaginal bleeding since September. Pt states she is also having severe abdominal cramping. Pt placed on Megace last December after having same issue and then OBGYN took her off it the first week in August and then she started spotting and then started with heavy bleeding in September so she called OBGYN on Tues and restarted loading dose of Megace on Wednesday but continues to pass large amounts of clots. Pt states she is going through 10-12 pads during waking hours.

## 2019-01-22 ENCOUNTER — Ambulatory Visit: Payer: No Typology Code available for payment source | Admitting: Advanced Practice Midwife

## 2019-01-22 ENCOUNTER — Encounter: Payer: Self-pay | Admitting: *Deleted

## 2019-01-24 ENCOUNTER — Other Ambulatory Visit: Payer: Self-pay

## 2019-01-24 ENCOUNTER — Telehealth (INDEPENDENT_AMBULATORY_CARE_PROVIDER_SITE_OTHER): Payer: No Typology Code available for payment source | Admitting: Advanced Practice Midwife

## 2019-01-24 ENCOUNTER — Encounter: Payer: Self-pay | Admitting: Advanced Practice Midwife

## 2019-01-24 VITALS — Ht 60.0 in | Wt 165.0 lb

## 2019-01-24 DIAGNOSIS — Q8500 Neurofibromatosis, unspecified: Secondary | ICD-10-CM | POA: Insufficient documentation

## 2019-01-24 DIAGNOSIS — E669 Obesity, unspecified: Secondary | ICD-10-CM | POA: Insufficient documentation

## 2019-01-24 DIAGNOSIS — N939 Abnormal uterine and vaginal bleeding, unspecified: Secondary | ICD-10-CM

## 2019-01-24 NOTE — Progress Notes (Signed)
TELEHEALTH VIRTUAL GYN VISIT ENCOUNTER NOTE Patient name: Anna Willis MRN [id number]  Date of birth: [birth year]  I connected with patient on 01/24/19 at 10:50 AM EDT by MyChart and verified that I am speaking with the correct person using two identifiers.  Due to COVID-19 recommendations, pt is not currently in the office.    I discussed the limitations, risks, security and privacy concerns of performing an evaluation and management service by telephone and the availability of in person appointments. I also discussed with the patient that there may be a patient responsible charge related to this service. The patient expressed understanding and agreed to proceed.   Chief Complaint:   having irregular bleeding (painful and passing clots; currently on Megace but it's causing weight gain )  History of Present Illness:   Anna Willis is a 38 y.o. G34P0010 Caucasian female being evaluated today for abnl uterine bleeding.   Dec 2019: heavier vag bldg with clots- started on Megace   Began having weight gain (25lbs total)  Sept 2020: supposed to have IUD placement with Dr Alysia Penna in  but he did not recommend it and instead she was given Lysteda; with next cycle, she took the [place] and it made her migranes worse   Jan 12, 2019: [facility] to [facility] ED on 01/11/09 for 3 days of heavy bldg- given DMPA x 2 doses. Currently on 40 mg Megace daily and wants to get off  Patient's last menstrual period was 01/09/2019. The current method of family planning is none.  Last pap: not reviewed  Review of Systems:   Pertinent items are noted in HPI Denies fever/chills, dizziness, headaches, visual disturbances, fatigue, shortness of breath, chest pain, abdominal pain, vomiting, abnormal vaginal discharge/itching/odor/irritation, problems with periods, bowel movements, urination, or intercourse unless otherwise stated above.  Pertinent History Reviewed:  Reviewed past medical,surgical, social, obstetrical and  family history.  Reviewed problem list, medications and allergies. Physical Assessment:   Vitals:   01/24/19 1055  Weight: 165 lb (74.8 kg)  Height: 5' (1.524 m)  Body mass index is 32.22 kg/m.       Physical Examination:   General:  Alert, oriented and cooperative.   Mental Status: Normal mood and affect perceived. Normal judgment and thought content.  Physical exam deferred due to nature of the encounter  No results found for this or any previous visit (from the past 24 hour(s)).  Assessment & Plan:  1) AUB> currently managed with Megace and experiencing weight gain- would like to get off  *Rev'd with Dr Emelda Fear: would like pt to come in for ebx, and then with nl result will schedule IUD placement when uterine lining is thin  2) Migranes> uses prn Toprol and Imitrex  Meds: No orders of the defined types were placed in this encounter.   No orders of the defined types were placed in this encounter.   I discussed the assessment and treatment plan with the patient. The patient was provided an opportunity to ask questions and all were answered. The patient agreed with the plan and demonstrated an understanding of the instructions.   The patient was advised to call back or seek an in-person evaluation/go to the ED if the symptoms worsen or if the condition fails to improve as anticipated.  I provided 13 minutes of non-face-to-face time during this encounter.   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

## 2019-02-08 MED FILL — METOPROLOL SUCCINATE ER 25: 25 | 90 days supply | Qty: 90 | Fill #1

## 2019-02-15 MED FILL — MEGESTROL 40 MG TABLET: 40 | 45 days supply | Qty: 45 | Fill #2

## 2019-02-19 MED FILL — SUMAtriptan SUCCINATE 100 M: 100 | 30 days supply | Qty: 9 | Fill #2

## 2019-03-01 MED FILL — buPROPion HCL ER (XL) 150 M: 150 | 30 days supply | Qty: 30 | Fill #0

## 2019-03-06 MED FILL — AZITHROMYCIN 250 MG TABLET: 250 | 5 days supply | Qty: 6 | Fill #0

## 2019-03-06 MED FILL — METHYLPREDNISOLONE 4 MG TAB: 4 | 6 days supply | Qty: 21 | Fill #0

## 2019-03-15 ENCOUNTER — Telehealth: Payer: Self-pay | Admitting: *Deleted

## 2019-03-15 NOTE — Telephone Encounter (Signed)
Patient called wanting to know if there was any special prep before an endometrial biopsy. Advised patient no prep before procedure. Patient voiced understanding.

## 2019-03-16 ENCOUNTER — Encounter: Payer: Self-pay | Admitting: Obstetrics & Gynecology

## 2019-03-16 ENCOUNTER — Other Ambulatory Visit: Payer: Self-pay

## 2019-03-16 ENCOUNTER — Ambulatory Visit: Payer: No Typology Code available for payment source | Admitting: Obstetrics & Gynecology

## 2019-03-16 VITALS — BP 120/77 | HR 98 | Ht 60.0 in | Wt 158.0 lb

## 2019-03-16 DIAGNOSIS — N946 Dysmenorrhea, unspecified: Secondary | ICD-10-CM

## 2019-03-16 DIAGNOSIS — N920 Excessive and frequent menstruation with regular cycle: Secondary | ICD-10-CM

## 2019-03-16 NOTE — Progress Notes (Signed)
Chief Complaint  Patient presents with  . Follow-up      38 y.o. G1P0010 No LMP recorded. The current method of family planning is none.  Outpatient Encounter Medications as of 03/16/2019  Medication Sig  . buPROPion (WELLBUTRIN SR) 150 MG 12 hr tablet   . esomeprazole (NEXIUM) 20 MG capsule Take 20 mg by mouth daily at 12 noon.  . megestrol (MEGACE) 40 MG tablet Take 40 mg by mouth daily.  . metoprolol succinate (TOPROL-XL) 25 MG 24 hr tablet Take 25 mg by mouth daily.  . Acetaminophen-Caff-Pyrilamine (MIDOL COMPLETE) 500-60-15 MG TABS Take by mouth.  . cyclobenzaprine (FLEXERIL) 10 MG tablet Take 10 mg by mouth every 8 (eight) hours as needed.   . diphenhydrAMINE HCl, Sleep, (ZZZQUIL) 25 MG CAPS Take 1-2 capsules by mouth at bedtime as needed (for sleep).  Marland Kitchen ibuprofen (ADVIL,MOTRIN) 200 MG tablet Take 400-600 mg by mouth every 6 (six) hours as needed for mild pain or moderate pain.  . naproxen (NAPROSYN) 500 MG tablet Take 1 tablet (500 mg total) by mouth 2 (two) times daily. (Patient not taking: Reported on 03/16/2019)  . ondansetron (ZOFRAN ODT) 4 MG disintegrating tablet Take 1 tablet (4 mg total) by mouth every 6 (six) hours as needed for nausea. (Patient not taking: Reported on 03/16/2019)  . SUMAtriptan (IMITREX) 100 MG tablet    No facility-administered encounter medications on file as of 03/16/2019.     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 Acid reflux   . Anxiety   . Migraine   . Neurofibromatosis (HCC)   . Scoliosis   . Seasonal allergies   . Tachycardia     Past Surgical History:  Procedure Laterality Date  . DILATION AND CURETTAGE OF UTERUS    . TOE  AMPUTATION     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   . WISDOM TOOTH EXTRACTION      OB History    Gravida  1   Para      Term      Preterm      AB  1   Living        SAB  1   TAB      Ectopic      Multiple      Live Births              Allergies  Allergen Reactions  . Amoxicillin     Not feeling well  . Rocephin [Ceftriaxone] Nausea And Vomiting  . Erythromycin Rash    Social History   Socioeconomic History  . Marital status: Married    Spouse name: Not on file  . Number of children: 0  . Years of education: 49  . Highest education level: Not on file  Occupational History  . Occupation: Hair dresser  Social Needs  . Financial resource strain: Not on file  . Food insecurity    Worry: Not on file    Inability: Not on file  . Transportation needs    Medical: Not on file    Non-medical: Not on file  Tobacco Use  . Smoking status: Former Smoker    Years: 10.00  Types: Cigarettes  . Smokeless tobacco: Never Used  Substance and Sexual Activity  . Alcohol use: No  . Drug use: No  . Sexual activity: Yes    Comment: has female partner  Lifestyle  . Physical activity    Days per week: Not on file    Minutes per session: Not on file  . Stress: Not on file  Relationships  . Social Musicianconnections    Talks on phone: Not on file    Gets together: Not on file    Attends religious service: Not on file    Active member of club or organization: Not on file    Attends meetings of clubs or organizations: Not on file    Relationship status: Not on file  Other Topics Concern  . Not on file  Social History Narrative   Lives at home with her partner, Morrie Sheldonshley.   Right-handed.   0.5 cup coffee and 1 soda per day.    Family History  Problem Relation Age of Onset  . Migraines Mother   . Epilepsy Mother   . Endometriosis Mother        had hyst at age 38  . Neurofibromatosis Father   . Dementia Maternal Grandmother   . Kidney disease Maternal Grandmother    . Other Maternal Grandmother        shingles  . Heart attack Maternal Grandfather   . Breast cancer Paternal Aunt   . Breast cancer Paternal Aunt   . Breast cancer Paternal Aunt     Medications:       Current Outpatient Medications:  .  buPROPion (WELLBUTRIN SR) 150 MG 12 hr tablet, , Disp: , Rfl:  .  esomeprazole (NEXIUM) 20 MG capsule, Take 20 mg by mouth daily at 12 noon., Disp: , Rfl:  .  megestrol (MEGACE) 40 MG tablet, Take 40 mg by mouth daily., Disp: , Rfl:  .  metoprolol succinate (TOPROL-XL) 25 MG 24 hr tablet, Take 25 mg by mouth daily., Disp: , Rfl:  .  Acetaminophen-Caff-Pyrilamine (MIDOL COMPLETE) 500-60-15 MG TABS, Take by mouth., Disp: , Rfl:  .  cyclobenzaprine (FLEXERIL) 10 MG tablet, Take 10 mg by mouth every 8 (eight) hours as needed. , Disp: , Rfl:  .  diphenhydrAMINE HCl, Sleep, (ZZZQUIL) 25 MG CAPS, Take 1-2 capsules by mouth at bedtime as needed (for sleep)., Disp: , Rfl:  .  ibuprofen (ADVIL,MOTRIN) 200 MG tablet, Take 400-600 mg by mouth every 6 (six) hours as needed for mild pain or moderate pain., Disp: , Rfl:  .  naproxen (NAPROSYN) 500 MG tablet, Take 1 tablet (500 mg total) by mouth 2 (two) times daily. (Patient not taking: Reported on 03/16/2019), Disp: 30 tablet, Rfl: 0 .  ondansetron (ZOFRAN ODT) 4 MG disintegrating tablet, Take 1 tablet (4 mg total) by mouth every 6 (six) hours as needed for nausea. (Patient not taking: Reported on 03/16/2019), Disp: 20 tablet, Rfl: 0 .  SUMAtriptan (IMITREX) 100 MG tablet, , Disp: , Rfl:   Objective Blood pressure 120/77, pulse 98, height 5' (1.524 m), weight 158 lb (71.7 kg).  Gen WDWN NAD  Pertinent ROS No burning with urination, frequency or urgency No nausea, vomiting or diarrhea Nor fever chills or other constitutional symptoms   Labs or studies     Impression Diagnoses this Encounter::   ICD-10-CM   1. Menorrhagia with regular cycle  N92.0   2. Dysmenorrhea  N94.6     Established relevant  diagnosis(es):   Plan/Recommendations: No  orders of the defined types were placed in this encounter.   Labs or Scans Ordered: No orders of the defined types were placed in this encounter.   Management:: >Continue megestrol for now, pt has school and life issues to address >follow up per patient, but have discussed mini lap hysterectomy(including cervix due to dyspareunia) with preservation of ovaries maybe im May when school is out  Follow up Return if symptoms worsen or fail to improve, for will leave the ball in [place]'s court for follow up.        Face to face time:  15 minutes  Greater than 50% of the visit time was spent in counseling and coordination of care with the patient.  The summary and outline of the counseling and care coordination is summarized in the note above.   All questions were answered.

## 2019-03-29 MED FILL — buPROPion HCL ER (XL) 150 M: 150 | 30 days supply | Qty: 30 | Fill #1

## 2019-03-29 MED FILL — SUMAtriptan SUCCINATE 100 M: 100 | 30 days supply | Qty: 9 | Fill #3

## 2019-04-04 MED FILL — MEGESTROL 40 MG TABLET: 40 | 45 days supply | Qty: 45 | Fill #3

## 2019-05-01 MED FILL — buPROPion HCL ER (XL) 150 M: 150 | 30 days supply | Qty: 30 | Fill #2

## 2019-05-15 ENCOUNTER — Other Ambulatory Visit: Payer: Self-pay | Admitting: Obstetrics & Gynecology

## 2019-05-15 MED FILL — METOPROLOL SUCCINATE ER 25: 25 | 90 days supply | Qty: 90 | Fill #0

## 2019-05-15 MED FILL — MEGESTROL 40 MG TABLET: 40 | 45 days supply | Qty: 45 | Fill #0

## 2019-05-30 MED FILL — buPROPion HCL ER (XL) 150 M: 150 | 30 days supply | Qty: 30 | Fill #0

## 2019-06-29 MED FILL — SUMAtriptan SUCCINATE 100 M: 100 | 30 days supply | Qty: 9 | Fill #4

## 2019-06-29 MED FILL — MEGESTROL 40 MG TABLET: 40 | 45 days supply | Qty: 45 | Fill #1

## 2019-06-29 MED FILL — buPROPion HCL ER (XL) 150 M: 150 | 30 days supply | Qty: 30 | Fill #1

## 2019-07-17 ENCOUNTER — Encounter (HOSPITAL_COMMUNITY): Payer: Self-pay

## 2019-07-17 ENCOUNTER — Other Ambulatory Visit: Payer: Self-pay

## 2019-07-17 ENCOUNTER — Observation Stay (HOSPITAL_COMMUNITY)
Admission: EM | Admit: 2019-07-17 | Discharge: 2019-07-18 | Disposition: A | Discharge status: Home / Self Care | Payer: No Typology Code available for payment source | Attending: General Surgery | Admitting: General Surgery

## 2019-07-17 ENCOUNTER — Emergency Department (HOSPITAL_COMMUNITY): Payer: No Typology Code available for payment source

## 2019-07-17 DIAGNOSIS — F419 Anxiety disorder, unspecified: Secondary | ICD-10-CM | POA: Diagnosis not present

## 2019-07-17 DIAGNOSIS — Z79899 Other long term (current) drug therapy: Secondary | ICD-10-CM | POA: Diagnosis not present

## 2019-07-17 DIAGNOSIS — Z880 Allergy status to penicillin: Secondary | ICD-10-CM | POA: Insufficient documentation

## 2019-07-17 DIAGNOSIS — K3580 Unspecified acute appendicitis: Secondary | ICD-10-CM | POA: Diagnosis present

## 2019-07-17 DIAGNOSIS — N920 Excessive and frequent menstruation with regular cycle: Secondary | ICD-10-CM | POA: Diagnosis not present

## 2019-07-17 DIAGNOSIS — Z881 Allergy status to other antibiotic agents status: Secondary | ICD-10-CM | POA: Diagnosis not present

## 2019-07-17 DIAGNOSIS — R1084 Generalized abdominal pain: Secondary | ICD-10-CM

## 2019-07-17 DIAGNOSIS — K219 Gastro-esophageal reflux disease without esophagitis: Secondary | ICD-10-CM | POA: Insufficient documentation

## 2019-07-17 DIAGNOSIS — Z6829 Body mass index (BMI) 29.0-29.9, adult: Secondary | ICD-10-CM | POA: Diagnosis not present

## 2019-07-17 DIAGNOSIS — R109 Unspecified abdominal pain: Secondary | ICD-10-CM | POA: Diagnosis present

## 2019-07-17 DIAGNOSIS — Z87891 Personal history of nicotine dependence: Secondary | ICD-10-CM | POA: Diagnosis not present

## 2019-07-17 DIAGNOSIS — Z20822 Contact with and (suspected) exposure to covid-19: Secondary | ICD-10-CM | POA: Diagnosis not present

## 2019-07-17 DIAGNOSIS — E669 Obesity, unspecified: Secondary | ICD-10-CM | POA: Diagnosis not present

## 2019-07-17 DIAGNOSIS — Q8500 Neurofibromatosis, unspecified: Secondary | ICD-10-CM | POA: Insufficient documentation

## 2019-07-17 DIAGNOSIS — R000 Tachycardia, unspecified: Secondary | ICD-10-CM | POA: Insufficient documentation

## 2019-07-17 DIAGNOSIS — Z79818 Long term (current) use of other agents affecting estrogen receptors and estrogen levels: Secondary | ICD-10-CM | POA: Diagnosis not present

## 2019-07-17 DIAGNOSIS — G43909 Migraine, unspecified, not intractable, without status migrainosus: Secondary | ICD-10-CM | POA: Insufficient documentation

## 2019-07-17 LAB — I-STAT BETA HCG BLOOD, ED (MC, WL, AP ONLY): I-stat hCG, quantitative: <5 m[IU]/mL (ref <5)

## 2019-07-17 LAB — URINALYSIS, ROUTINE W REFLEX MICROSCOPIC
Bilirubin Urine: NEGATIVE
Glucose, UA: NEGATIVE mg/dL
Hgb urine dipstick: NEGATIVE
Ketones, ur: 5 mg/dL — AB
Leukocytes,Ua: NEGATIVE
Nitrite: NEGATIVE
Protein, ur: NEGATIVE mg/dL
Specific Gravity, Urine: 1.013 (ref 1.005–1.030)
pH: 6 (ref 5.0–8.0)

## 2019-07-17 LAB — CBC WITH DIFFERENTIAL/PLATELET
Abs Immature Granulocytes: 0.03 10*3/uL (ref 0.00–0.07)
Basophils Absolute: 0.1 10*3/uL (ref 0.0–0.1)
Basophils Relative: 1 %
Eosinophils Absolute: 0.4 10*3/uL (ref 0.0–0.5)
Eosinophils Relative: 5 %
HCT: 40 % (ref 36.0–46.0)
Hemoglobin: 13.2 g/dL (ref 12.0–15.0)
Immature Granulocytes: 0 %
Lymphocytes Relative: 23 %
Lymphs Abs: 1.9 10*3/uL (ref 0.7–4.0)
MCH: 29.8 pg (ref 26.0–34.0)
MCHC: 33 g/dL (ref 30.0–36.0)
MCV: 90.3 fL (ref 80.0–100.0)
Monocytes Absolute: 0.7 10*3/uL (ref 0.1–1.0)
Monocytes Relative: 8 %
Neutro Abs: 5.3 10*3/uL (ref 1.7–7.7)
Neutrophils Relative %: 63 %
Platelets: 302 10*3/uL (ref 150–400)
RBC: 4.43 MIL/uL (ref 3.87–5.11)
RDW: 12.8 % (ref 11.5–15.5)
WBC: 8.4 10*3/uL (ref 4.0–10.5)
nRBC: 0 % (ref 0.0–0.2)

## 2019-07-17 LAB — COMPREHENSIVE METABOLIC PANEL
ALT: 39 U/L (ref 0–44)
AST: 29 U/L (ref 15–41)
Albumin: 4.4 g/dL (ref 3.5–5.0)
Alkaline Phosphatase: 62 U/L (ref 38–126)
Anion gap: 7 (ref 5–15)
BUN: 17 mg/dL (ref 6–20)
CO2: 22 mmol/L (ref 22–32)
Calcium: 9.2 mg/dL (ref 8.9–10.3)
Chloride: 105 mmol/L (ref 98–111)
Creatinine, Ser: 0.69 mg/dL (ref 0.44–1.00)
GFR calc Af Amer: >60 mL/min (ref >60)
GFR calc non Af Amer: >60 mL/min (ref >60)
Glucose, Bld: 89 mg/dL (ref 70–99)
Potassium: 3.7 mmol/L (ref 3.5–5.1)
Sodium: 134 mmol/L — ABNORMAL LOW (ref 135–145)
Total Bilirubin: 0.4 mg/dL (ref 0.3–1.2)
Total Protein: 8.1 g/dL (ref 6.5–8.1)

## 2019-07-17 LAB — RESPIRATORY PANEL BY RT PCR (FLU A&B, COVID)
Influenza A by PCR: NEGATIVE
Influenza B by PCR: NEGATIVE
SARS Coronavirus 2 by RT PCR: NEGATIVE

## 2019-07-17 LAB — WET PREP, GENITAL
Sperm: NONE SEEN
Trich, Wet Prep: NONE SEEN
Yeast Wet Prep HPF POC: NONE SEEN

## 2019-07-17 LAB — LIPASE, BLOOD: Lipase: 37 U/L (ref 11–51)

## 2019-07-17 MED ORDER — MEGESTROL ACETATE 40 MG PO TABS
40.0000 mg | ORAL_TABLET | Freq: Every day | ORAL | Status: DC
  Administered 2019-07-18: 40 mg via ORAL
  Filled 2019-07-17 (×3): qty 1

## 2019-07-17 MED ORDER — ACETAMINOPHEN 325 MG PO TABS: 650.0000 mg | ORAL_TABLET | Freq: Four times a day (QID) | ORAL | Status: DC | PRN

## 2019-07-17 MED ORDER — DIPHENHYDRAMINE HCL 50 MG/ML IJ SOLN: 12.5000 mg | Freq: Four times a day (QID) | INTRAMUSCULAR | Status: DC | PRN

## 2019-07-17 MED ORDER — ONDANSETRON HCL 4 MG/2ML IJ SOLN
4.0000 mg | Freq: Once | INTRAMUSCULAR | Status: AC
  Administered 2019-07-17: 4 mg via INTRAVENOUS
  Filled 2019-07-17: qty 2

## 2019-07-17 MED ORDER — PANTOPRAZOLE SODIUM 40 MG PO TBEC
40.0000 mg | DELAYED_RELEASE_TABLET | Freq: Every day | ORAL | Status: DC
  Administered 2019-07-18: 40 mg via ORAL
  Filled 2019-07-17: qty 1

## 2019-07-17 MED ORDER — CYCLOBENZAPRINE HCL 10 MG PO TABS: 10.0000 mg | ORAL_TABLET | Freq: Three times a day (TID) | ORAL | Status: DC | PRN

## 2019-07-17 MED ORDER — MORPHINE SULFATE (PF) 2 MG/ML IV SOLN
2.0000 mg | INTRAVENOUS | Status: DC | PRN
  Administered 2019-07-18: 2 mg via INTRAVENOUS
  Filled 2019-07-17: qty 1

## 2019-07-17 MED ORDER — IOHEXOL 300 MG/ML  SOLN
100.0000 mL | Freq: Once | INTRAMUSCULAR | Status: AC | PRN
  Administered 2019-07-17: 100 mL via INTRAVENOUS

## 2019-07-17 MED ORDER — METOPROLOL SUCCINATE ER 25 MG PO TB24
25.0000 mg | ORAL_TABLET | Freq: Every day | ORAL | Status: DC
  Administered 2019-07-18: 25 mg via ORAL
  Filled 2019-07-17: qty 1

## 2019-07-17 MED ORDER — MORPHINE SULFATE (PF) 4 MG/ML IV SOLN
4.0000 mg | Freq: Once | INTRAVENOUS | Status: AC
  Administered 2019-07-17: 4 mg via INTRAVENOUS
  Filled 2019-07-17: qty 1

## 2019-07-17 MED ORDER — LACTATED RINGERS IV SOLN: INTRAVENOUS | Status: DC

## 2019-07-17 MED ORDER — ENOXAPARIN SODIUM 40 MG/0.4ML ~~LOC~~ SOLN
40.0000 mg | SUBCUTANEOUS | Status: DC
  Administered 2019-07-18: 40 mg via SUBCUTANEOUS
  Filled 2019-07-17: qty 0.4

## 2019-07-17 MED ORDER — METOPROLOL TARTRATE 5 MG/5ML IV SOLN: 5.0000 mg | Freq: Four times a day (QID) | INTRAVENOUS | Status: DC | PRN

## 2019-07-17 MED ORDER — BUPROPION HCL ER (SR) 150 MG PO TB12
150.0000 mg | ORAL_TABLET | Freq: Two times a day (BID) | ORAL | Status: DC
  Administered 2019-07-18: 150 mg via ORAL
  Filled 2019-07-17 (×4): qty 1

## 2019-07-17 MED ORDER — DIPHENHYDRAMINE HCL 12.5 MG/5ML PO ELIX: 12.5000 mg | ORAL_SOLUTION | Freq: Four times a day (QID) | ORAL | Status: DC | PRN

## 2019-07-17 MED ORDER — SODIUM CHLORIDE 0.9 % IV BOLUS
1000.0000 mL | Freq: Once | INTRAVENOUS | Status: AC
  Administered 2019-07-17: 1000 mL via INTRAVENOUS

## 2019-07-17 MED ORDER — IBUPROFEN 400 MG PO TABS: 400.0000 mg | ORAL_TABLET | Freq: Four times a day (QID) | ORAL | Status: DC | PRN

## 2019-07-17 MED ORDER — ONDANSETRON 4 MG PO TBDP: 4.0000 mg | ORAL_TABLET | Freq: Four times a day (QID) | ORAL | Status: DC | PRN

## 2019-07-17 MED ORDER — ACETAMINOPHEN 650 MG RE SUPP: 650.0000 mg | Freq: Four times a day (QID) | RECTAL | Status: DC | PRN

## 2019-07-17 MED ORDER — METRONIDAZOLE IN NACL 5-0.79 MG/ML-% IV SOLN: 500.0000 mg | Freq: Once | INTRAVENOUS | Status: DC

## 2019-07-17 MED ORDER — ONDANSETRON HCL 4 MG/2ML IJ SOLN
4.0000 mg | Freq: Four times a day (QID) | INTRAMUSCULAR | Status: DC | PRN
  Administered 2019-07-18: 4 mg via INTRAVENOUS
  Filled 2019-07-17: qty 2

## 2019-07-17 MED ORDER — CIPROFLOXACIN IN D5W 400 MG/200ML IV SOLN: 400.0000 mg | Freq: Once | INTRAVENOUS | Status: DC

## 2019-07-17 MED ORDER — DIPHENHYDRAMINE HCL (SLEEP) 25 MG PO CAPS: 1.0000 | ORAL_CAPSULE | Freq: Every evening | ORAL | Status: DC | PRN

## 2019-07-17 NOTE — Progress Notes (Signed)
[facility]  Patient coming in with RLQ pain. Appendix dilated on CT but no stranding and no leukocytosis on labs.   Plan for NPO, IVF, ED will do pelvic exam to rule out other etiology, hold antibiotics.   Will see how patient feels in the am and if leukocytosis forming.    Will plan for lap appy if continued pain worsening WBC, etc.  Otherwise may do po trial and d/c home.   COVID rapid to be done in ED pending need for surgery tomorrow.  Algis Greenhouse, MD [facility] [address] [place], [place] [address] [phone] (office)

## 2019-07-17 NOTE — ED Triage Notes (Signed)
Pt presents to ED with complaints of mid abdominal pain started at 1300. Pt also reports nausea but denies vomiting and diarrhea.

## 2019-07-17 NOTE — ED Provider Notes (Signed)
[facility] EMERGENCY DEPARTMENT Provider Note   CSN: [id number] Arrival date & time: 07/17/19  1819     History Chief Complaint  Patient presents with  . Abdominal Pain    Anna Willis is a 39 y.o. female with a past medical history of neurofibromatosis, acid reflux, depression, who presents today for evaluation of mid abdominal pain. She reports that her pain started at about 1:00 today.  Feels cramping in nature and is in her upper abdomen.  It does not radiate or move.  Her pain waxes and wanes.  It started after she ate lunch today.  It is made worse with movement and pressure.  She reports that when the pain gets severe she gets nauseous however she denies vomiting or diarrhea.  She states she has been slightly constipated recently however is still having bowel movements.  No known fevers or sick contacts.  No dysuria, increased frequency or urgency.  She has never had any abdominal surgeries before.  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  That she is currently taking Megace to help with her symptoms.  She reports that this pain is different.  HPI     Past Medical History:  Diagnosis Date  . Acid reflux   . Anxiety   . Migraine   . Neurofibromatosis (HCC)   . Scoliosis   . Seasonal allergies   . Tachycardia     Patient Active Problem List   Diagnosis Date Noted  . Abdominal pain 07/17/2019  . Neurofibromatosis syndrome (HCC) 01/24/2019  . Obesity with body mass index 30 or greater 01/24/2019  . Menorrhagia 11/13/2018  . Dysmenorrhea 03/24/2018    Past Surgical History:  Procedure Laterality Date  . DILATION AND CURETTAGE OF UTERUS    . TOE AMPUTATION     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   . WISDOM TOOTH EXTRACTION       OB History    Gravida  1   Para      Term      Preterm      AB  1   Living        SAB  1   TAB      Ectopic      Multiple      Live  Births              Family History  Problem Relation Age of Onset  . Migraines Mother   . Epilepsy Mother   . Endometriosis Mother        had hyst at age 53  . Neurofibromatosis Father   . Dementia Maternal Grandmother   . Kidney disease Maternal Grandmother   . Other Maternal Grandmother        shingles  . Heart attack Maternal Grandfather   . Breast cancer Paternal Aunt   . Breast cancer Paternal Aunt   . Breast cancer Paternal Aunt     Social History   Tobacco Use  . Smoking status: Former Smoker    Years: 10.00    Types: Cigarettes  . Smokeless tobacco: Never Used  Substance Use Topics  . Alcohol use: No  . Drug use: No    Home Medications Prior to Admission medications   Medication Sig Start Date End Date Taking? Authorizing Provider  Acetaminophen-Caff-Pyrilamine (MIDOL COMPLETE) 500-60-15 MG TABS Take 1 tablet by mouth daily as needed (for pain).  Yes [provider]  ANUCORT-HC 25 MG suppository Place 1-2 suppositories rectally daily as needed for hemorrhoids.  05/31/19  Yes [provider]  buPROPion (WELLBUTRIN SR) 150 MG 12 hr tablet Take 150 mg by mouth every evening.  03/01/19  Yes [provider]  cyclobenzaprine (FLEXERIL) 10 MG tablet Take 10 mg by mouth every 8 (eight) hours as needed for muscle spasms.  12/06/18  Yes [provider]  diphenhydrAMINE HCl, Sleep, (ZZZQUIL) 25 MG CAPS Take 1-2 capsules by mouth at bedtime as needed (for sleep).   Yes [provider]  esomeprazole (NEXIUM) 20 MG capsule Take 20 mg by mouth in the morning.    Yes [provider]  ibuprofen (ADVIL,MOTRIN) 200 MG tablet Take 400-600 mg by mouth daily as needed for mild pain or moderate pain.    Yes [provider]  JUBLIA 10 % SOLN Apply 1 application topically daily. 48 week course starting on 04/18/2019 04/18/19  Yes [provider]  megestrol (MEGACE) 40 MG tablet TAKE 3 TABLETS BY MOUTH DAILY FOR 5  DAYS, 2 DAILY FOR 5 DAYS, THEN 1 DAILY Patient taking differently: Take 40 mg by mouth in the morning.  05/15/19  Yes [name], MD  metoprolol succinate (TOPROL-XL) 25 MG 24 hr tablet Take 25 mg by mouth every evening.    Yes [provider]  ondansetron (ZOFRAN ODT) 4 MG disintegrating tablet Take 1 tablet (4 mg total) by mouth every 6 (six) hours as needed for nausea. 11/13/18  Yes Hermina Staggers, MD  SUMAtriptan (IMITREX) 100 MG tablet Take 50-100 mg by mouth every 2 (two) hours as needed for migraine or headache.  12/12/18  Yes [provider]    Allergies    Amoxicillin, Rocephin [ceftriaxone], and Erythromycin  Review of Systems   Review of Systems  Constitutional: Negative for chills and fever.  HENT: Negative for congestion.   Eyes: Negative for visual disturbance.  Respiratory: Negative for chest tightness and shortness of breath.   Gastrointestinal: Positive for abdominal pain, constipation (mild) and nausea. Negative for abdominal distention, diarrhea and vomiting.  Genitourinary: Negative for dysuria.  Musculoskeletal: Negative for back pain and neck pain.  Skin: Negative for color change, rash and wound.  Neurological: Negative for weakness and headaches.  All other systems reviewed and are negative.   Physical Exam Updated Vital Signs BP (!) 121/56   Pulse (!) 104   Temp 98.3 F (36.8 C) (Oral)   Resp 20   Ht 5' (1.524 m)   Wt 69.4 kg   SpO2 100%   BMI 29.88 kg/m   Physical Exam Vitals and nursing note reviewed. Exam conducted with a chaperone present.  Constitutional:      General: She is not in acute distress.    Appearance: She is well-developed. She is not diaphoretic.     Comments: Appears uncomfortable  HENT:     Head: Normocephalic and atraumatic.     Mouth/Throat:     Mouth: Mucous membranes are moist.  Eyes:     General: No scleral icterus.       Right eye: No discharge.        Left eye: No discharge.      Conjunctiva/sclera: Conjunctivae normal.  Cardiovascular:     Rate and Rhythm: Normal rate and regular rhythm.     Heart sounds: Normal heart sounds.  Pulmonary:     Effort: Pulmonary effort is normal. No respiratory distress.     Breath  sounds: No stridor.  Abdominal:     General: Bowel sounds are decreased. There is no distension.     Palpations: Abdomen is soft.     Tenderness: There is generalized abdominal tenderness (Worse in RLQ). There is no right CVA tenderness, left CVA tenderness, guarding or rebound.     Hernia: No hernia is present.  Genitourinary:    Vagina: Normal.     Cervix: No cervical motion tenderness or discharge.     Uterus: Normal.      Adnexa: Right adnexa normal and left adnexa normal.  Musculoskeletal:        General: No deformity.     Cervical back: Normal range of motion.  Skin:    General: Skin is warm and dry.  Neurological:     General: No focal deficit present.     Mental Status: She is alert.     Cranial Nerves: No cranial nerve deficit.     Motor: No abnormal muscle tone.  Psychiatric:        Mood and Affect: Mood normal.        Behavior: Behavior normal.     ED Results / Procedures / Treatments   Labs (all labs ordered are listed, but only abnormal results are displayed) Labs Reviewed  WET PREP, GENITAL - Abnormal; Notable for the following components:      Result Value   Clue Cells Wet Prep HPF POC PRESENT (*)    WBC, Wet Prep HPF POC RARE (*)    All other components within normal limits  COMPREHENSIVE METABOLIC PANEL - Abnormal; Notable for the following components:   Sodium 134 (*)    All other components within normal limits  URINALYSIS, ROUTINE W REFLEX MICROSCOPIC - Abnormal; Notable for the following components:   Color, Urine STRAW (*)    Ketones, ur 5 (*)    All other components within normal limits  RESPIRATORY PANEL BY RT PCR (FLU A&B, COVID)  CBC WITH DIFFERENTIAL/PLATELET  LIPASE, BLOOD  HIV ANTIBODY (ROUTINE TESTING  W REFLEX)  BASIC METABOLIC PANEL  CBC WITH DIFFERENTIAL/PLATELET  I-STAT BETA HCG BLOOD, ED ([facility], [facility], [facility] ONLY)  GC/CHLAMYDIA PROBE AMP ([place]) NOT AT [facility]    EKG None  Radiology CT Abdomen Pelvis W Contrast  Result Date: 07/17/2019 CLINICAL DATA:  Acute generalized abdominal pain. EXAM: CT ABDOMEN AND PELVIS WITH CONTRAST TECHNIQUE: Multidetector CT imaging of the abdomen and pelvis was performed using the standard protocol following bolus administration of intravenous contrast. CONTRAST:  OMNIPAQUE IOHEXOL 300 MG/ML  SOLN COMPARISON:  None. FINDINGS: Lower chest: No acute abnormality. Hepatobiliary: No focal liver abnormality is seen. No gallstones, gallbladder wall thickening, or biliary dilatation. Pancreas: Unremarkable. No pancreatic ductal dilatation or surrounding inflammatory changes. Spleen: Normal in size without focal abnormality. Adrenals/Urinary Tract: Adrenal glands appear normal. Small nonobstructive right renal calculus is noted. No hydronephrosis or renal obstruction is noted. Urinary bladder is unremarkable. Stomach/Bowel: The stomach appears normal. There is no evidence of bowel obstruction. However, the appendix is enlarged measuring 14 mm in maximum diameter. No definite surrounding inflammation is noted, but appendicitis cannot be excluded. Vascular/Lymphatic: No significant vascular findings are present. No enlarged abdominal or pelvic lymph nodes. Reproductive: Uterus and bilateral adnexa are unremarkable. Other: No abdominal wall hernia or abnormality. No abdominopelvic ascites. Musculoskeletal: No acute or significant osseous findings. IMPRESSION: 1. Enlarged appendix is noted measuring 14 mm in maximum diameter, but no definite surrounding inflammation is noted. This is concerning for acute appendicitis in  the appropriate clinical setting. 2. Small nonobstructive right renal calculus. Electronically Signed   By: Lupita RaiderJames  Green Jr M.D.   On: 07/17/2019 20:15     Procedures Procedures (including critical care time)  Medications Ordered in ED Medications  ibuprofen (ADVIL) tablet 400-600 mg (has no administration in time range)  metoprolol succinate (TOPROL-XL) 24 hr tablet 25 mg (has no administration in time range)  megestrol (MEGACE) tablet 40 mg (has no administration in time range)  buPROPion (WELLBUTRIN SR) 12 hr tablet 150 mg (has no administration in time range)  pantoprazole (PROTONIX) EC tablet 40 mg (has no administration in time range)  cyclobenzaprine (FLEXERIL) tablet 10 mg (has no administration in time range)  enoxaparin (LOVENOX) injection 40 mg (40 mg Subcutaneous Not Given 07/17/19 2113)  lactated ringers infusion ( Intravenous New Bag/Given 07/17/19 2201)  acetaminophen (TYLENOL) tablet 650 mg (has no administration in time range)    Or  acetaminophen (TYLENOL) suppository 650 mg (has no administration in time range)  morphine 2 MG/ML injection 2 mg (has no administration in time range)  diphenhydrAMINE (BENADRYL) 12.5 MG/5ML elixir 12.5 mg (has no administration in time range)    Or  diphenhydrAMINE (BENADRYL) injection 12.5 mg (has no administration in time range)  ondansetron (ZOFRAN-ODT) disintegrating tablet 4 mg (has no administration in time range)    Or  ondansetron (ZOFRAN) injection 4 mg (has no administration in time range)  metoprolol tartrate (LOPRESSOR) injection 5 mg (has no administration in time range)  ondansetron (ZOFRAN) injection 4 mg (4 mg Intravenous Given 07/17/19 1850)  morphine 4 MG/ML injection 4 mg (4 mg Intravenous Given 07/17/19 1851)  sodium chloride 0.9 % bolus 1,000 mL (1,000 mLs Intravenous New Bag/Given 07/17/19 1904)  iohexol (OMNIPAQUE) 300 MG/ML solution 100 mL (100 mLs Intravenous Contrast Given 07/17/19 1952)    ED Course  I have reviewed the triage vital signs and the nursing notes.  Pertinent labs & imaging results that were available during my care of the patient were reviewed by me and  considered in my medical decision making (see chart for details).    MDM Rules/Calculators/A&P                     Patient is a 39 year old woman who presents today for evaluation of approximately 5 hours of central abdominal pain.  The pain is waxing and waning. On exam she appears to be uncomfortable.  Abdomen is generally tender, however worse is in the right lower quadrant. Labs are obtained and reviewed showing no significant acute hematologic or electrolyte derangements.  Pregnancy test is negative. Urine shows no evidence of infection. CT scan was obtained showing enlarged appendix without surrounding inflammatory changes. I spoke with Dr. Henreitta LeberBridges, on-call for general surgery who requests pelvic exam, in addition to holding antibiotics at this time and the 2-hour Covid test in addition to keeping patient n.p.o.  Note: Portions of this report may have been transcribed using voice recognition software. Every effort was made to ensure accuracy; however, inadvertent computerized transcription errors may be present  The patient appears reasonably stabilized for admission considering the current resources, flow, and capabilities available in the ED at this time, and I doubt any other [facility] requiring further screening and/or treatment in the ED prior to admission.  Final Clinical Impression(s) / ED Diagnoses Final diagnoses:  Acute appendicitis, unspecified acute appendicitis type  Generalized abdominal pain    Rx / DC Orders ED Discharge Orders    None  Lorin Glass, PA-C 07/17/19 2346    Milton Ferguson, MD 07/18/19 1148

## 2019-07-18 ENCOUNTER — Encounter (HOSPITAL_COMMUNITY): Payer: Self-pay | Admitting: General Surgery

## 2019-07-18 ENCOUNTER — Observation Stay (HOSPITAL_COMMUNITY): Payer: No Typology Code available for payment source | Admitting: Anesthesiology

## 2019-07-18 ENCOUNTER — Encounter (HOSPITAL_COMMUNITY)
Admission: EM | Disposition: A | Discharge status: Home / Self Care | Payer: Self-pay | Source: Home / Self Care | Attending: Emergency Medicine

## 2019-07-18 DIAGNOSIS — K3580 Unspecified acute appendicitis: Principal | ICD-10-CM

## 2019-07-18 HISTORY — PX: LAPAROSCOPIC APPENDECTOMY: SHX408

## 2019-07-18 LAB — BASIC METABOLIC PANEL
Anion gap: 7 (ref 5–15)
BUN: 12 mg/dL (ref 6–20)
CO2: 23 mmol/L (ref 22–32)
Calcium: 8.8 mg/dL — ABNORMAL LOW (ref 8.9–10.3)
Chloride: 109 mmol/L (ref 98–111)
Creatinine, Ser: 0.69 mg/dL (ref 0.44–1.00)
GFR calc Af Amer: >60 mL/min (ref >60)
GFR calc non Af Amer: >60 mL/min (ref >60)
Glucose, Bld: 84 mg/dL (ref 70–99)
Potassium: 3.8 mmol/L (ref 3.5–5.1)
Sodium: 139 mmol/L (ref 135–145)

## 2019-07-18 LAB — SURGICAL PCR SCREEN
MRSA, PCR: NEGATIVE
Staphylococcus aureus: POSITIVE — AB

## 2019-07-18 LAB — CBC WITH DIFFERENTIAL/PLATELET
Abs Immature Granulocytes: 0.03 10*3/uL (ref 0.00–0.07)
Basophils Absolute: 0.1 10*3/uL (ref 0.0–0.1)
Basophils Relative: 1 %
Eosinophils Absolute: 0.3 10*3/uL (ref 0.0–0.5)
Eosinophils Relative: 5 %
HCT: 34.7 % — ABNORMAL LOW (ref 36.0–46.0)
Hemoglobin: 11.1 g/dL — ABNORMAL LOW (ref 12.0–15.0)
Immature Granulocytes: 1 %
Lymphocytes Relative: 25 %
Lymphs Abs: 1.6 10*3/uL (ref 0.7–4.0)
MCH: 29.1 pg (ref 26.0–34.0)
MCHC: 32 g/dL (ref 30.0–36.0)
MCV: 91.1 fL (ref 80.0–100.0)
Monocytes Absolute: 0.5 10*3/uL (ref 0.1–1.0)
Monocytes Relative: 9 %
Neutro Abs: 3.7 10*3/uL (ref 1.7–7.7)
Neutrophils Relative %: 59 %
Platelets: 226 10*3/uL (ref 150–400)
RBC: 3.81 MIL/uL — ABNORMAL LOW (ref 3.87–5.11)
RDW: 12.7 % (ref 11.5–15.5)
WBC: 6.2 10*3/uL (ref 4.0–10.5)
nRBC: 0 % (ref 0.0–0.2)

## 2019-07-18 LAB — HIV ANTIBODY (ROUTINE TESTING W REFLEX): HIV Screen 4th Generation wRfx: NONREACTIVE

## 2019-07-18 SURGERY — APPENDECTOMY, LAPAROSCOPIC
Anesthesia: General | Site: Abdomen

## 2019-07-18 MED ORDER — LACTATED RINGERS IV SOLN: INTRAVENOUS | Status: DC | PRN

## 2019-07-18 MED ORDER — CHLORHEXIDINE GLUCONATE CLOTH 2 % EX PADS
6.0000 | MEDICATED_PAD | Freq: Once | CUTANEOUS | Status: DC
  Administered 2019-07-18: 6 via TOPICAL

## 2019-07-18 MED ORDER — PROPOFOL 10 MG/ML IV BOLUS
INTRAVENOUS | Status: AC
  Filled 2019-07-18: qty 20

## 2019-07-18 MED ORDER — LIDOCAINE HCL (CARDIAC) PF 100 MG/5ML IV SOSY
PREFILLED_SYRINGE | INTRAVENOUS | Status: DC | PRN
  Administered 2019-07-18: 60 mg via INTRAVENOUS

## 2019-07-18 MED ORDER — MIDAZOLAM HCL 5 MG/5ML IJ SOLN
INTRAMUSCULAR | Status: DC | PRN
  Administered 2019-07-18 (×2): 1 mg via INTRAVENOUS

## 2019-07-18 MED ORDER — FENTANYL CITRATE (PF) 250 MCG/5ML IJ SOLN
INTRAMUSCULAR | Status: AC
  Filled 2019-07-18: qty 5

## 2019-07-18 MED ORDER — ONDANSETRON HCL 4 MG/2ML IJ SOLN
INTRAMUSCULAR | Status: DC | PRN
  Administered 2019-07-18: 4 mg via INTRAVENOUS

## 2019-07-18 MED ORDER — KETOROLAC TROMETHAMINE 30 MG/ML IJ SOLN
30.0000 mg | Freq: Once | INTRAMUSCULAR | Status: AC
  Administered 2019-07-18: 30 mg via INTRAVENOUS

## 2019-07-18 MED ORDER — MIDAZOLAM HCL 2 MG/2ML IJ SOLN
INTRAMUSCULAR | Status: AC
  Filled 2019-07-18: qty 2

## 2019-07-18 MED ORDER — FENTANYL CITRATE (PF) 100 MCG/2ML IJ SOLN
INTRAMUSCULAR | Status: DC | PRN
  Administered 2019-07-18: 50 ug via INTRAVENOUS
  Administered 2019-07-18 (×2): 100 ug via INTRAVENOUS

## 2019-07-18 MED ORDER — OXYCODONE HCL 5 MG PO TABS: 5.0000 mg | ORAL_TABLET | ORAL | 0 refills | Status: DC | PRN

## 2019-07-18 MED ORDER — SUMATRIPTAN SUCCINATE 50 MG PO TABS
50.0000 mg | ORAL_TABLET | ORAL | Status: DC | PRN
  Administered 2019-07-18: 100 mg via ORAL
  Filled 2019-07-18: qty 2

## 2019-07-18 MED ORDER — SCOPOLAMINE 1 MG/3DAYS TD PT72
1.0000 | MEDICATED_PATCH | Freq: Once | TRANSDERMAL | Status: DC
  Administered 2019-07-18: 1.5 mg via TRANSDERMAL
  Filled 2019-07-18: qty 1

## 2019-07-18 MED ORDER — BUPIVACAINE LIPOSOME 1.3 % IJ SUSP
INTRAMUSCULAR | Status: DC | PRN
  Administered 2019-07-18: 20 mL

## 2019-07-18 MED ORDER — SUCCINYLCHOLINE CHLORIDE 200 MG/10ML IV SOSY
PREFILLED_SYRINGE | INTRAVENOUS | Status: AC
  Filled 2019-07-18: qty 10

## 2019-07-18 MED ORDER — PROMETHAZINE HCL 25 MG/ML IJ SOLN: 6.2500 mg | INTRAMUSCULAR | Status: DC | PRN

## 2019-07-18 MED ORDER — HYDROMORPHONE HCL 1 MG/ML IJ SOLN
0.2500 mg | INTRAMUSCULAR | Status: DC | PRN
  Administered 2019-07-18 (×2): 0.5 mg via INTRAVENOUS
  Filled 2019-07-18: qty 0.5

## 2019-07-18 MED ORDER — OXYCODONE HCL 5 MG PO TABS: 5.0000 mg | ORAL_TABLET | ORAL | Status: DC | PRN

## 2019-07-18 MED ORDER — SODIUM CHLORIDE 0.9 % IR SOLN
Status: DC | PRN
  Administered 2019-07-18: 1000 mL

## 2019-07-18 MED ORDER — LIDOCAINE 2% (20 MG/ML) 5 ML SYRINGE
INTRAMUSCULAR | Status: AC
  Filled 2019-07-18: qty 5

## 2019-07-18 MED ORDER — DOCUSATE SODIUM 100 MG PO CAPS
100.0000 mg | ORAL_CAPSULE | Freq: Two times a day (BID) | ORAL | Status: DC
  Administered 2019-07-18: 100 mg via ORAL
  Filled 2019-07-18: qty 1

## 2019-07-18 MED ORDER — ROCURONIUM BROMIDE 10 MG/ML (PF) SYRINGE
PREFILLED_SYRINGE | INTRAVENOUS | Status: AC
  Filled 2019-07-18: qty 10

## 2019-07-18 MED ORDER — BUPIVACAINE LIPOSOME 1.3 % IJ SUSP
INTRAMUSCULAR | Status: AC
  Filled 2019-07-18: qty 20

## 2019-07-18 MED ORDER — ROCURONIUM BROMIDE 100 MG/10ML IV SOLN
INTRAVENOUS | Status: DC | PRN
  Administered 2019-07-18: 50 mg via INTRAVENOUS

## 2019-07-18 MED ORDER — MIDAZOLAM HCL 2 MG/2ML IJ SOLN
2.0000 mg | Freq: Once | INTRAMUSCULAR | Status: AC
  Administered 2019-07-18: 2 mg via INTRAVENOUS
  Filled 2019-07-18: qty 2

## 2019-07-18 MED ORDER — DOCUSATE SODIUM 100 MG PO CAPS: 100.0000 mg | ORAL_CAPSULE | Freq: Two times a day (BID) | ORAL | 1 refills | Status: DC

## 2019-07-18 MED ORDER — HYDROMORPHONE HCL 1 MG/ML IJ SOLN
INTRAMUSCULAR | Status: AC
  Filled 2019-07-18: qty 0.5

## 2019-07-18 MED ORDER — SODIUM CHLORIDE 0.9 % IV SOLN
1.0000 g | INTRAVENOUS | Status: AC
  Administered 2019-07-18: 13:00:00 1 g via INTRAVENOUS
  Filled 2019-07-18 (×2): qty 1

## 2019-07-18 MED ORDER — ONDANSETRON 4 MG PO TBDP: 4.0000 mg | ORAL_TABLET | Freq: Four times a day (QID) | ORAL | 0 refills | Status: DC | PRN

## 2019-07-18 MED ORDER — KETOROLAC TROMETHAMINE 30 MG/ML IJ SOLN
INTRAMUSCULAR | Status: AC
  Filled 2019-07-18: qty 1

## 2019-07-18 MED ORDER — SUGAMMADEX SODIUM 500 MG/5ML IV SOLN
INTRAVENOUS | Status: DC | PRN
  Administered 2019-07-18: 400 mg via INTRAVENOUS

## 2019-07-18 MED ORDER — PROPOFOL 10 MG/ML IV BOLUS
INTRAVENOUS | Status: DC | PRN
  Administered 2019-07-18: 200 mg via INTRAVENOUS

## 2019-07-18 MED ORDER — ONDANSETRON HCL 4 MG/2ML IJ SOLN
INTRAMUSCULAR | Status: AC
  Filled 2019-07-18: qty 2

## 2019-07-18 MED ORDER — DEXAMETHASONE SODIUM PHOSPHATE 10 MG/ML IJ SOLN
INTRAMUSCULAR | Status: AC
  Filled 2019-07-18: qty 1

## 2019-07-18 MED ORDER — LACTATED RINGERS IV SOLN: Freq: Once | INTRAVENOUS | Status: AC

## 2019-07-18 MED ORDER — DEXAMETHASONE SODIUM PHOSPHATE 10 MG/ML IJ SOLN
INTRAMUSCULAR | Status: DC | PRN
  Administered 2019-07-18: 4 mg via INTRAVENOUS

## 2019-07-18 MED ORDER — MEPERIDINE HCL 50 MG/ML IJ SOLN: 6.2500 mg | INTRAMUSCULAR | Status: DC | PRN

## 2019-07-18 MED ORDER — SUCCINYLCHOLINE CHLORIDE 200 MG/10ML IV SOSY
PREFILLED_SYRINGE | INTRAVENOUS | Status: DC | PRN
  Administered 2019-07-18: 100 mg via INTRAVENOUS

## 2019-07-18 SURGICAL SUPPLY — 51 items
BAG RETRIEVAL 10 (BASKET) ×1
BLADE SURG 15 STRL LF DISP TIS (BLADE) ×1 IMPLANT
BLADE SURG 15 STRL SS (BLADE) ×1
CHLORAPREP W/TINT 26 (MISCELLANEOUS) ×2 IMPLANT
CLOTH BEACON ORANGE TIMEOUT ST (SAFETY) ×2 IMPLANT
COVER LIGHT HANDLE STERIS (MISCELLANEOUS) ×4 IMPLANT
COVER WAND RF STERILE (DRAPES) ×2 IMPLANT
CUTTER FLEX LINEAR 45M (STAPLE) ×2 IMPLANT
DERMABOND ADVANCED (GAUZE/BANDAGES/DRESSINGS) ×1
DERMABOND ADVANCED .7 DNX12 (GAUZE/BANDAGES/DRESSINGS) ×1 IMPLANT
ELECT REM PT RETURN 9FT ADLT (ELECTROSURGICAL) ×2
ELECTRODE REM PT RTRN 9FT ADLT (ELECTROSURGICAL) ×1 IMPLANT
GLOVE BIO SURGEON STRL SZ 6.5 (GLOVE) ×2 IMPLANT
GLOVE BIOGEL PI IND STRL 6.5 (GLOVE) ×1 IMPLANT
GLOVE BIOGEL PI IND STRL 7.0 (GLOVE) ×3 IMPLANT
GLOVE BIOGEL PI INDICATOR 6.5 (GLOVE) ×1
GLOVE BIOGEL PI INDICATOR 7.0 (GLOVE) ×3
GLOVE ECLIPSE 6.5 STRL STRAW (GLOVE) ×1 IMPLANT
GOWN STRL REUS W/TWL LRG LVL3 (GOWN DISPOSABLE) ×4 IMPLANT
INST SET LAPROSCOPIC AP (KITS) ×2 IMPLANT
KIT TURNOVER KIT A (KITS) ×2 IMPLANT
MANIFOLD NEPTUNE II (INSTRUMENTS) ×2 IMPLANT
NDL HYPO 18GX1.5 BLUNT FILL (NEEDLE) ×1 IMPLANT
NDL INSUFFLATION 14GA 120MM (NEEDLE) ×1 IMPLANT
NEEDLE HYPO 18GX1.5 BLUNT FILL (NEEDLE) ×2 IMPLANT
NEEDLE HYPO 22GX1.5 SAFETY (NEEDLE) ×2 IMPLANT
NEEDLE INSUFFLATION 14GA 120MM (NEEDLE) ×2 IMPLANT
NS IRRIG 1000ML POUR BTL (IV SOLUTION) ×2 IMPLANT
PACK LAP CHOLE LZT030E (CUSTOM PROCEDURE TRAY) ×2 IMPLANT
PAD ARMBOARD 7.5X6 YLW CONV (MISCELLANEOUS) ×2 IMPLANT
PENCIL HANDSWITCHING (ELECTRODE) ×1 IMPLANT
RELOAD 45 VASCULAR/THIN (ENDOMECHANICALS) IMPLANT
RELOAD STAPLE 45 2.5 WHT GRN (ENDOMECHANICALS) IMPLANT
RELOAD STAPLE 45 3.5 BLU ETS (ENDOMECHANICALS) IMPLANT
RELOAD STAPLE TA45 3.5 REG BLU (ENDOMECHANICALS) ×2 IMPLANT
SET BASIN LINEN APH (SET/KITS/TRAYS/PACK) ×2 IMPLANT
SET TUBE IRRIG SUCTION NO TIP (IRRIGATION / IRRIGATOR) IMPLANT
SET TUBE SMOKE EVAC HIGH FLOW (TUBING) ×2 IMPLANT
SHEARS HARMONIC ACE PLUS 36CM (ENDOMECHANICALS) ×2 IMPLANT
SUT MNCRL AB 4-0 PS2 18 (SUTURE) ×4 IMPLANT
SUT VICRYL 0 UR6 27IN ABS (SUTURE) ×2 IMPLANT
SYR 20ML LL LF (SYRINGE) ×4 IMPLANT
SYS BAG RETRIEVAL 10MM (BASKET) ×1
SYSTEM BAG RETRIEVAL 10MM (BASKET) ×1 IMPLANT
TRAY FOLEY W/BAG SLVR 16FR (SET/KITS/TRAYS/PACK) ×1
TRAY FOLEY W/BAG SLVR 16FR ST (SET/KITS/TRAYS/PACK) ×1 IMPLANT
TROCAR ENDO BLADELESS 11MM (ENDOMECHANICALS) ×2 IMPLANT
TROCAR ENDO BLADELESS 12MM (ENDOMECHANICALS) ×2 IMPLANT
TROCAR XCEL NON-BLD 5MMX100MML (ENDOMECHANICALS) ×2 IMPLANT
WARMER LAPAROSCOPE (MISCELLANEOUS) ×2 IMPLANT
YANKAUER SUCT 12FT TUBE ARGYLE (SUCTIONS) ×2 IMPLANT

## 2019-07-18 NOTE — Anesthesia Postprocedure Evaluation (Signed)
Anesthesia Post Note  Patient: Anna Willis  Procedure(s) Performed: APPENDECTOMY LAPAROSCOPIC (N/A Abdomen)  Patient location during evaluation: PACU Anesthesia Type: General Level of consciousness: awake and alert, oriented, awake and patient cooperative Pain management: pain level controlled Vital Signs Assessment: post-procedure vital signs reviewed and stable Respiratory status: spontaneous breathing, respiratory function stable and nonlabored ventilation Cardiovascular status: stable Postop Assessment: no apparent nausea or vomiting Anesthetic complications: no     Last Vitals:  Vitals:   07/18/19 1245 07/18/19 1300  BP:  128/87  Pulse:    Resp: (!) 25 16  Temp:    SpO2: 98% 97%    Last Pain:  Vitals:   07/18/19 1227  TempSrc: Oral  PainSc: 3                  Kele Withem

## 2019-07-18 NOTE — Progress Notes (Signed)
[facility]  Notified spouse, Kaelea surgery completed. If patient feeling ok and tolerates po can go home later today.   Rx will need to be sent to [facility] in [place].  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   Algis Greenhouse, MD [facility] [address] [place], [place] [address] [phone] (office)

## 2019-07-18 NOTE — Progress Notes (Signed)
IV removed and DC instructions reviewed. Scripts sent to pharmacy.  Transported by WC to car.  Sign other to drive home.  F/U with Dr. Henreitta Leber on 4/22

## 2019-07-18 NOTE — Anesthesia Preprocedure Evaluation (Signed)
Anesthesia Evaluation  Patient identified by MRN, date of birth, ID band Patient awake    Reviewed: Allergy & Precautions, NPO status , Patient's Chart, lab work & pertinent test results  History of Anesthesia Complications Negative for: history of anesthetic complications  Airway Mallampati: II  TM Distance: >3 FB Neck ROM: Full    Dental  (+) Dental Advisory Given, Teeth Intact   Pulmonary former smoker,    breath sounds clear to auscultation       Cardiovascular Exercise Tolerance: Good  Rhythm:Regular Rate:Normal     Neuro/Psych  Headaches, Anxiety Neurofibromatosis   Neuromuscular disease (Neurofibromatosis )    GI/Hepatic Neg liver ROS, GERD  Medicated and Controlled,  Endo/Other  negative endocrine ROS  Renal/GU      Musculoskeletal   Abdominal   Peds  Hematology negative hematology ROS (+)   Anesthesia Other Findings H/o motion sickness  Reproductive/Obstetrics negative OB ROS                             Anesthesia Physical Anesthesia Plan  ASA: II  Anesthesia Plan: General   Post-op Pain Management:    Induction: Intravenous  PONV Risk Score and Plan: 4 or greater and Ondansetron, Dexamethasone, Midazolam and Scopolamine patch - Pre-op  Airway Management Planned: Oral ETT  Additional Equipment:   Intra-op Plan:   Post-operative Plan: Extubation in OR  Informed Consent: I have reviewed the patients History and Physical, chart, labs and discussed the procedure including the risks, benefits and alternatives for the proposed anesthesia with the patient or authorized representative who has indicated his/her understanding and acceptance.     Dental advisory given  Plan Discussed with: CRNA  Anesthesia Plan Comments:         Anesthesia Quick Evaluation

## 2019-07-18 NOTE — Transfer of Care (Signed)
Immediate Anesthesia Transfer of Care Note  Patient: Anna Willis  Procedure(s) Performed: APPENDECTOMY LAPAROSCOPIC (N/A Abdomen)  Patient Location: PACU  Anesthesia Type:General  Level of Consciousness: awake, alert , oriented and patient cooperative  Airway & Oxygen Therapy: Patient Spontanous Breathing  Post-op Assessment: Report given to RN, Post -op Vital signs reviewed and stable and Patient moving all extremities X 4  Post vital signs: Reviewed and stable  Last Vitals:  Vitals Value Taken Time  BP    Temp    Pulse 105 07/18/19 1415  Resp    SpO2 84 % 07/18/19 1415  Vitals shown include unvalidated device data.  Last Pain:  Vitals:   07/18/19 1227  TempSrc: Oral  PainSc: 3       Patients Stated Pain Goal: 4 (07/18/19 1227)  Complications: No apparent anesthesia complications

## 2019-07-18 NOTE — Discharge Summary (Signed)
Physician Discharge Summary  Patient ID: Anna Willis MRN: [id number] DOB/AGE: [birth year] 39 y.o.  Admit date: 07/17/2019 Discharge date: 07/18/2019  Admission Diagnoses: Acute appendicitis   Discharge Diagnoses:  Principal Problem:   Acute appendicitis Active Problems:   Abdominal pain   Discharged Condition: good  Hospital Course: Anna Willis is a 39 yo who came to the hospital with RLQ pain and was found to have a dilated appendix on CT scan. She was brought in for observation as there was not clear evidence of appendicitis and was continuing to have pain and nausea in the AM. We discussed the options of po trial and going home versus surgery and she opted to undergo surgery to definitively determine if she had appendicitis.   She was taken to the OR and was found to have dilated appendix with some mild erythema which is consistent with early appendicitis.   Consults: None  Significant Diagnostic Studies: CT a/p- dilated appendix but no edema or stranding  Treatments: IV hydration and Laparoscopic appendectomy 4/7  Discharge Exam: Blood pressure 138/83, pulse (!) 109, temperature 98.4 F (36.9 C), temperature source Oral, resp. rate 16, height 5' (1.524 m), weight 69.4 kg, last menstrual period 07/18/2019, SpO2 98 %.   Disposition: Discharge disposition: 01-Home or Self Care       Discharge Instructions    Call MD for:  difficulty breathing, headache or visual disturbances   Complete by: As directed    Call MD for:  persistant dizziness or light-headedness   Complete by: As directed    Call MD for:  persistant nausea and vomiting   Complete by: As directed    Call MD for:  redness, tenderness, or signs of infection (pain, swelling, redness, odor or green/yellow discharge around incision site)   Complete by: As directed    Call MD for:  severe uncontrolled pain   Complete by: As directed    Call MD for:  temperature >100.4   Complete by: As directed    Increase  activity slowly   Complete by: As directed      Allergies as of 07/18/2019      Reactions   Amoxicillin    Not feeling well   Rocephin [ceftriaxone] Nausea And Vomiting   Erythromycin Rash      Medication List    TAKE these medications   Anucort-HC 25 MG suppository Generic drug: hydrocortisone Place 1-2 suppositories rectally daily as needed for hemorrhoids.   cyclobenzaprine 10 MG tablet Commonly known as: FLEXERIL Take 10 mg by mouth every 8 (eight) hours as needed for muscle spasms.   docusate sodium 100 MG capsule Commonly known as: COLACE Take 1 capsule (100 mg total) by mouth 2 (two) times daily.   esomeprazole 20 MG capsule Commonly known as: NEXIUM Take 20 mg by mouth in the morning.   ibuprofen 200 MG tablet Commonly known as: ADVIL Take 400-600 mg by mouth daily as needed for mild pain or moderate pain.   Imitrex 100 MG tablet Generic drug: SUMAtriptan Take 50-100 mg by mouth every 2 (two) hours as needed for migraine or headache.   Jublia 10 % Soln Generic drug: Efinaconazole Apply 1 application topically daily. 48 week course starting on 04/18/2019   megestrol 40 MG tablet Commonly known as: MEGACE TAKE 3 TABLETS BY MOUTH DAILY FOR 5 DAYS, 2 DAILY FOR 5 DAYS, THEN 1 DAILY What changed: See the new instructions.   metoprolol succinate 25 MG 24 hr tablet Commonly known as: TOPROL-XL Take 25  mg by mouth every evening.   Midol Complete 500-60-15 MG Tabs Generic drug: Acetaminophen-Caff-Pyrilamine Take 1 tablet by mouth daily as needed (for pain).   ondansetron 4 MG disintegrating tablet Commonly known as: Zofran ODT Take 1 tablet (4 mg total) by mouth every 6 (six) hours as needed for nausea.   oxyCODONE 5 MG immediate release tablet Commonly known as: Oxy IR/ROXICODONE Take 1 tablet (5 mg total) by mouth every 4 (four) hours as needed for severe pain or breakthrough pain.   Wellbutrin SR 150 MG 12 hr tablet Generic drug: buPROPion Take 150 mg  by mouth every evening.   ZzzQuil 25 MG Caps Generic drug: diphenhydrAMINE HCl (Sleep) Take 1-2 capsules by mouth at bedtime as needed (for sleep).      Follow-up Information    Virl Cagey, MD On 08/02/2019.   Specialty: General Surgery Why: post op phone call 4/22, you can see [place] in person if needed, call the office  Contact information: [address] Linna Hoff [facility] [address] [phone]           Signed: Virl Cagey 07/18/2019, 6:13 PM

## 2019-07-18 NOTE — Discharge Instructions (Signed)
Discharge Laparoscopic Surgery Instructions:  Common Complaints: Right shoulder pain is common after laparoscopic surgery. This is secondary to the gas used in the surgery being trapped under the diaphragm.  Walk to help your body absorb the gas. This will improve in a few days. Pain at the port sites are common, especially the larger port sites. This will improve with time.  Some nausea is common and poor appetite. The main goal is to stay hydrated the first few days after surgery.   Diet/ Activity: Diet as tolerated. You may not have an appetite, but it is important to stay hydrated. Drink 64 ounces of water a day. Your appetite will return with time.  Shower per your regular routine daily.  Do not take hot showers. Take warm showers that are less than 10 minutes. Rest and listen to your body, but do not remain in bed all day.  Walk everyday for at least 15-20 minutes. Deep cough and move around every 1-2 hours in the first few days after surgery.  Do not lift > 10 lbs, perform excessive bending, pushing, pulling, squatting for about 1 wees after surgery.  Do not pick at the dermabond glue on your incision sites.  This glue film will remain in place for 1-2 weeks and will start to peel off.  Do not place lotions or balms on your incision unless instructed to specifically by Dr. Constance Haw.   Pain Expectations and Narcotics: -After surgery you will have pain associated with your incisions and this is normal. The pain is muscular and nerve pain, and will get better with time. -You are encouraged and expected to take non narcotic medications like tylenol and ibuprofen (when able) to treat pain as multiple modalities can aid with pain treatment. -Narcotics are only used when pain is severe or there is breakthrough pain. -You are not expected to have a pain score of 0 after surgery, as we cannot prevent pain. A pain score of 3-4 that allows you to be functional, move, walk, and tolerate some activity  is the goal. The pain will continue to improve over the days after surgery and is dependent on your surgery. -Due to  law, we are only able to give a certain amount of pain medication to treat post operative pain, and we only give additional narcotics on a patient by patient basis.  -For most laparoscopic surgery, studies have shown that the majority of patients only need 10-15 narcotic pills, and for open surgeries most patients only need 15-20.   -Having appropriate expectations of pain and knowledge of pain management with non narcotics is important as we do not want anyone to become addicted to narcotic pain medication.  -Using ice packs in the first 48 hours and heating pads after 48 hours, wearing an abdominal binder (when recommended), and using over the counter medications are all ways to help with pain management.   -Simple acts like meditation and mindfulness practices after surgery can also help with pain control and research has proven the benefit of these practices.  Medication: Take tylenol and ibuprofen as needed for pain control, alternating every 4-6 hours.  Example:  Tylenol 1000mg  @ 6am, 12noon, 6pm, 8midnight (Do not exceed 4000mg  of tylenol a day). Ibuprofen 800mg  @ 9am, 3pm, 9pm, 3am (Do not exceed 3600mg  of ibuprofen a day).  Take Roxicodone for breakthrough pain every 4 hours.  Take Colace for constipation related to narcotic pain medication. If you do not have a bowel movement in 2 days, take  Miralax over the counter.  Drink plenty of water to also prevent constipation.   Contact Information: If you have questions or concerns, please call our office, [phone], Monday- Thursday 8AM-5PM and Friday 8AM-12Noon.  If it is after hours or on the weekend, please call [facility], [phone], and ask to speak to the surgeon on call for Dr. Henreitta Leber at [facility].    Laparoscopic Appendectomy, Adult, Care After This sheet gives you information about how to care for  yourself after your procedure. Your doctor may also give you more specific instructions. If you have problems or questions, contact your doctor. What can I expect after the procedure? After the procedure, it is common to have:  Little energy for normal activities.  Mild pain in the area where the cuts from surgery (incisions) were made.  Trouble pooping (constipation). This can be caused by: ? Pain medicine. ? A lack of activity. Follow these instructions at home: Medicines  Take over-the-counter and prescription medicines only as told by your doctor.  If you were prescribed an antibiotic medicine, take it as told by your doctor. Do not stop taking it even if you start to feel better.  Do not drive or use heavy machinery while taking prescription pain medicine.  Ask your doctor if the medicine you are taking can cause trouble pooping. You may need to take steps to prevent or treat trouble pooping: ? Drink enough fluid to keep your pee (urine) pale yellow. ? Take over-the-counter or prescription medicines. ? Eat foods that are high in fiber. These include beans, whole grains, and fresh fruits and vegetables. ? Limit foods that are high in fat and sugar. These include fried or sweet foods. Incision care   Follow instructions from your doctor about how to take care of your cuts from surgery. Make sure you: ? Wash your hands with soap and water before and after you change your bandage (dressing). If you cannot use soap and water, use hand sanitizer. ? Change your bandage as told by your doctor. ? Leave stitches (sutures), skin glue, or skin tape (adhesive) strips in place. They may need to stay in place for 2 weeks or longer. If tape strips get loose and curl up, you may trim the loose edges. Do not remove tape strips completely unless your doctor says it is okay.  Check your cuts from surgery every day for signs of infection. Check for: ? Redness, swelling, or pain. ? Fluid or  blood. ? Warmth. ? Pus or a bad smell. Bathing  Keep your cuts from surgery clean and dry. Clean them as told by your doctor. To do this: 1. Gently wash the cuts with soap and water. 2. Rinse the cuts with water to remove all soap. 3. Pat the cuts dry with a clean towel. Do not rub the cuts. Do not take baths, swim, or use a hot tub for 2 weeks, or until your doctor says it is okay.  You may shower. Activity   Do not drive for 24 hours if you were given a medicine to help you relax (sedative) during your procedure.  Rest after the procedure. Return to your normal activities as told by your doctor. Ask your doctor what activities are safe for you.  For 1 week: ? Do not lift anything that is heavier than 10 lb (4.5 kg), or the limit that you are told. ? Do not play contact sports for next 2 weeks. General instructions  If you were sent  home with a drain, follow instructions from your doctor on how to care for it.  Take deep breaths. This helps to keep your lungs from getting an infection (pneumonia).  Keep all follow-up visits as told by your doctor. This is important. Contact a doctor if:  You have redness, swelling, or pain around a cut from surgery.  You have fluid or blood coming from a cut.  Your cut feels warm to the touch.  You have pus or a bad smell coming from a cut or a bandage.  The edges of a cut break open after the stitches have been taken out.  You have pain in your shoulders that gets worse.  You feel dizzy or you pass out (faint).  You have shortness of breath.  You keep feeling sick to your stomach (nauseous).  You keep throwing up (vomiting).  You get watery poop (diarrhea) or you cannot control your poop.  You lose your appetite.  You have swelling or pain in your legs.  You get a rash. Get help right away if:  You have a fever.  You have trouble breathing.  You have sharp pains in your chest. Summary  After the procedure, it is  common to have low energy, mild pain, and trouble pooping.  Infection is a common problem after this procedure. Follow your doctor's instructions about caring for yourself after the procedure.  Rest after the procedure. Return to your normal activities as told by your doctor.  Contact your doctor if you see signs of infection around your cuts from surgery, or you get short of breath. Get help right away if you have a fever, chest pain, or trouble breathing. This information is not intended to replace advice given to you by your health care provider. Make sure you discuss any questions you have with your health care provider. Document Revised: 09/29/2017 Document Reviewed: 09/29/2017 Elsevier Patient Education  2020 [employer].

## 2019-07-18 NOTE — Progress Notes (Signed)
Transported from pacu at around 1500 after lap appendectomy.  Three sites to abd dry and intact.  Vitals normal.  Asked for jello and sprite.  Significant other at bedside

## 2019-07-18 NOTE — Op Note (Signed)
[facility]  Date of Surgery: 07/17/2019 - 07/18/2019  Admit Date: 07/17/2019   Performing Service: General  Surgeon(s) and Role:    Lucretia Roers, MD - Primary   Pre-operative Diagnosis: Acute Appendicitis  Post-operative Diagnosis: Acute Appendicitis  Procedure Performed: Laparoscopic Appendectomy   Surgeon: Leatrice Jewels. Henreitta Leber, MD   Assistant: No qualified resident was available.   Anesthesia: General   Findings:  The appendix was found to be inflamed. There were not signs of necrosis. There was not perforation. There was not abscess formation.   Estimated Blood Loss: Minimal   Specimens:  ID Type Source Tests Collected by Time Destination  1 : appendix Tissue PATH Appendix SURGICAL PATHOLOGY Lucretia Roers, MD 07/18/2019 1350      Complications: None; patient tolerated the procedure well.   Disposition: PACU - hemodynamically stable.   Condition: stable   Indications: The patient presented with a 1 day history of right-sided abdominal pain. A CT revealed findings of a dilated appendix but no overt stranding or inflammatory changes. She was brought in for observation and I discussed the option of a po trial and see if this evolved versus laparoscopic appendectomy given her continued pain and nausea.  We also discussed [place] protocols for antibiotics for uncomplicated appendicitis.  She decided for appendectomy.   Procedure Details  Prior to the procedure, the risks, benefits, complications, treatment options, and expected outcomes were discussed with the patient and/or family, including but not limited to the risk of bleeding, infection, finding of a normal appendix, and the need for conversion to an open procedure. There was concurrence with the proposed plan and informed consent was obtained. The patient was taken to the operating room, identified as Anna Willis and the procedure verified as Laproscopic Appendectomy.    The patient was placed  in the supine position and general anesthesia was induced, along with placement of orogastric tube, SCD's, and a Foley catheter. The abdomen was prepped and draped in a sterile fashion. The abdomen was entered with Veress technique in the infraumbilical incision. Intraperitoneal placement was confirmed with saline drop, low entry pressures, and easy insufflation. A 11 mm optiview trocar was placed under direct visualization with a 0 degree scope. The 10 mm 0 degree scope was placed in the abdomen and no evidence of injury was identified. A 12 mm port was placed in the left lower quadrant of the abdomen after skin incision with trocar placement under direct vision. A careful evaluation of the entire abdomen was carried out. An additional 5 mm port was placed in the suprapubic area under direct vision.  The patient was placed in Trendelenburg and left lateral decubitus position. The small intestines were retracted in the cephalad and left lateral direction away from the pelvis and right lower quadrant. The patient was found to have an dilated appendix that was slightly inflamed. There was not evidence of perforation.   The appendix was carefully dissected. The mesoappendix was taken with the harmonic energy device.  The appendix was divided at its base using standard endo-GIA stapler. Minimal appendiceal stump was left in place.  The appendix was placed within an Endocatch specimen bag. There was no evidence of bleeding, leakage, or complication after division of the appendix.  Any remaining blood or pus was suctioned out from the abdomen, hemostasis was confirmed. The endocatch bag was removed via the 12 mm port, then the abdomen desufflated. The appendix was passed off the field as a specimen.   The the  12 mm and 10 mm port sites were closed with a 0 Vicryl suture. Xparel was placed in the incisions.  The trocar site skin wounds were closed using subcuticular 4-0 Monocryl suture and dermabond. The patient was  then awakened from general anesthesia, extubated, and taken to PACU for recovery.   Instrument, sponge, and needle counts were correct at the conclusion of the case.   Curlene Labrum, MD [facility] [address] [place], [place] [id number] [phone]([id number])

## 2019-07-18 NOTE — H&P (Signed)
[facility] History and Physical  Reason for Referral: Dilated appendix/ Acute appendicitis  Referring Physician: ED   Chief Complaint    Abdominal Pain      Anna Willis is a 39 y.o. female.  HPI: Anna Willis is a 39 yo with tachycardia treated with metoprolol, neurofibromatosis per report, heavy menstrual bleeding who comes acute onset of right lower quadrant and and nausea. Anna Willis was seen in the ED and worked up and was found to have a dilated appendix on CT with no worrisome features of Anna Willis uretus or adnexa. Anna Willis pelvic exam done in the ED was negative. Anna Willis complained or no discharge or odor.  Anna Willis was brought into the hospital for observation and no antibiotics were given to see how Anna Willis did with the pain.  This Am we discussed the dilated appendix and that this could be an early appendicitis versus a normal appendix. We discussed the option of po trial and seeing if this worsens at home versus surgery. Initially Anna Willis wanted to try po and complained of a headache and requested Anna Willis Imitrex. Anna Willis started to have worsening RLQ pain and decided to opted for surgery to rule out/ in appendicitis.   Currently Anna Willis is nauseated and having pain.   Past Medical History:  Diagnosis Date  . Acid reflux   . Anxiety   . Migraine   . Neurofibromatosis (HCC)   . Scoliosis   . Seasonal allergies   . Tachycardia     Past Surgical History:  Procedure Laterality Date  . DILATION AND CURETTAGE OF UTERUS    . TOE AMPUTATION     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   . WISDOM TOOTH EXTRACTION      Family History  Problem Relation Age of Onset  . Migraines Mother   . Epilepsy Mother   . Endometriosis Mother        had hyst at age 85  . Neurofibromatosis Father   . Dementia Maternal Grandmother   . Kidney disease Maternal Grandmother   . Other Maternal Grandmother        shingles  . Heart attack Maternal Grandfather   . Breast cancer Paternal Aunt   . Breast cancer Paternal Aunt   .  Breast cancer Paternal Aunt     Social History   Tobacco Use  . Smoking status: Former Smoker    Years: 10.00    Types: Cigarettes  . Smokeless tobacco: Never Used  Substance Use Topics  . Alcohol use: No  . Drug use: No    Medications: I have reviewed the patient's current medications. Current Facility-Administered Medications  Medication Dose Route Frequency Provider Last Rate Last Admin  . acetaminophen (TYLENOL) tablet 650 mg  650 mg Oral Q6H PRN Lucretia Roers, MD       Or  . acetaminophen (TYLENOL) suppository 650 mg  650 mg Rectal Q6H PRN Lucretia Roers, MD      . buPROPion [facility]) 12 hr tablet 150 mg  150 mg Oral BID Lucretia Roers, MD   150 mg at 07/18/19 0035  . cyclobenzaprine (FLEXERIL) tablet 10 mg  10 mg Oral TID PRN Lucretia Roers, MD      . diphenhydrAMINE (BENADRYL) 12.5 MG/5ML elixir 12.5 mg  12.5 mg Oral Q6H PRN Lucretia Roers, MD       Or  . diphenhydrAMINE (BENADRYL) injection 12.5 mg  12.5 mg Intravenous Q6H PRN Lucretia Roers, MD      .  enoxaparin (LOVENOX) injection 40 mg  40 mg Subcutaneous Q24H Lucretia Roers, MD   40 mg at 07/18/19 2836  . ibuprofen (ADVIL) tablet 400-600 mg  400-600 mg Oral Q6H PRN Lucretia Roers, MD      . lactated ringers infusion   Intravenous Continuous Lucretia Roers, MD 75 mL/hr at 07/17/19 2201 New Bag at 07/17/19 2201  . megestrol (MEGACE) tablet 40 mg  40 mg Oral Daily Lucretia Roers, MD   40 mg at 07/18/19 6294  . metoprolol succinate (TOPROL-XL) 24 hr tablet 25 mg  25 mg Oral Daily Lucretia Roers, MD   25 mg at 07/18/19 7654  . metoprolol tartrate (LOPRESSOR) injection 5 mg  5 mg Intravenous Q6H PRN Lucretia Roers, MD      . morphine 2 MG/ML injection 2 mg  2 mg Intravenous Q3H PRN Lucretia Roers, MD   2 mg at 07/18/19 0036  . ondansetron (ZOFRAN-ODT) disintegrating tablet 4 mg  4 mg Oral Q6H PRN Lucretia Roers, MD       Or  . ondansetron [facility]) injection 4 mg   4 mg Intravenous Q6H PRN Lucretia Roers, MD   4 mg at 07/18/19 0836  . pantoprazole (PROTONIX) EC tablet 40 mg  40 mg Oral Daily Lucretia Roers, MD   40 mg at 07/18/19 0836  . SUMAtriptan (IMITREX) tablet 50-100 mg  50-100 mg Oral Q2H PRN Lucretia Roers, MD   100 mg at 07/18/19 6503   Allergies  Allergen Reactions  . Amoxicillin     Not feeling well  . Rocephin [Ceftriaxone] Nausea And Vomiting  . Erythromycin Rash    ROS:  A comprehensive review of systems was negative except for: Cardiovascular: positive for history of tachycardia on metoprolol Gastrointestinal: positive for abdominal pain, nausea and vomiting  Blood pressure 110/79, pulse 99, temperature 98.1 F (36.7 C), temperature source Oral, resp. rate 16, height 5' (1.524 m), weight 69.4 kg, SpO2 100 %. Physical Exam Vitals reviewed.  HENT:     Head: Normocephalic and atraumatic.  Eyes:     Extraocular Movements: Extraocular movements intact.  Cardiovascular:     Rate and Rhythm: Normal rate and regular rhythm.  Pulmonary:     Effort: Pulmonary effort is normal.  Abdominal:     General: There is no distension.     Palpations: Abdomen is soft.     Tenderness: There is abdominal tenderness in the right lower quadrant. There is no guarding.     Hernia: There is no hernia in the umbilical area.  Musculoskeletal:     Comments: Moves all extremities, no swelling  Skin:    General: Skin is warm and dry.  Neurological:     General: No focal deficit present.     Mental Status: Anna Willis is alert and oriented to person, place, and time.  Psychiatric:        Mood and Affect: Mood normal.        Behavior: Behavior normal.     Results: Results for orders placed or performed during the hospital encounter of 07/17/19 (from the past 48 hour(s))  CBC with Differential     Status: None   Collection Time: 07/17/19  6:38 PM  Result Value Ref Range   WBC 8.4 4.0 - 10.5 K/uL   RBC 4.43 3.87 - 5.11 MIL/uL   Hemoglobin  13.2 12.0 - 15.0 g/dL   HCT 54.6 56.8 - 12.7 %   MCV 90.3 80.0 -  100.0 fL   MCH 29.8 26.0 - 34.0 pg   MCHC 33.0 30.0 - 36.0 g/dL   RDW 40.912.8 81.111.5 - 91.415.5 %   Platelets 302 150 - 400 K/uL   nRBC 0.0 0.0 - 0.2 %   Neutrophils Relative % 63 %   Neutro Abs 5.3 1.7 - 7.7 K/uL   Lymphocytes Relative 23 %   Lymphs Abs 1.9 0.7 - 4.0 K/uL   Monocytes Relative 8 %   Monocytes Absolute 0.7 0.1 - 1.0 K/uL   Eosinophils Relative 5 %   Eosinophils Absolute 0.4 0.0 - 0.5 K/uL   Basophils Relative 1 %   Basophils Absolute 0.1 0.0 - 0.1 K/uL   Immature Granulocytes 0 %   Abs Immature Granulocytes 0.03 0.00 - 0.07 K/uL    Comment: Performed at [facility], [address] [place], [place] [address]  Lipase, blood     Status: None   Collection Time: 07/17/19  6:38 PM  Result Value Ref Range   Lipase 37 11 - 51 U/L    Comment: Performed at [facility], [address] [place], [place] [address]  Comprehensive metabolic panel     Status: Abnormal   Collection Time: 07/17/19  6:38 PM  Result Value Ref Range   Sodium 134 (L) 135 - 145 mmol/L   Potassium 3.7 3.5 - 5.1 mmol/L   Chloride 105 98 - 111 mmol/L   CO2 22 22 - 32 mmol/L   Glucose, Bld 89 70 - 99 mg/dL    Comment: Glucose reference range applies only to samples taken after fasting for at least 8 hours.   BUN 17 6 - 20 mg/dL   Creatinine, Ser 8.650.69 0.44 - 1.00 mg/dL   Calcium 9.2 8.9 - 78.410.3 mg/dL   Total Protein 8.1 6.5 - 8.1 g/dL   Albumin 4.4 3.5 - 5.0 g/dL   AST 29 15 - 41 U/L   ALT 39 0 - 44 U/L   Alkaline Phosphatase 62 38 - 126 U/L   Total Bilirubin 0.4 0.3 - 1.2 mg/dL   GFR calc non Af Amer >60 >60 mL/min   GFR calc Af Amer >60 >60 mL/min   Anion gap 7 5 - 15    Comment: Performed at [facility], [address] [place], [place] [address]  I-Stat beta hCG blood, ED     Status: None   Collection Time: 07/17/19  7:01 PM  Result Value Ref Range   I-stat hCG, quantitative <5.0 <5 mIU/mL   Comment 3            Comment:   GEST. AGE       CONC.  (mIU/mL)   <=1 WEEK        5 - 50     2 WEEKS       50 - 500     3 WEEKS       100 - 10,000     4 WEEKS     1,000 - 30,000        FEMALE AND NON-PREGNANT FEMALE:     LESS THAN 5 mIU/mL   Urinalysis, Routine w reflex microscopic     Status: Abnormal   Collection Time: 07/17/19  8:09 PM  Result Value Ref Range   Color, Urine STRAW (A) YELLOW   APPearance CLEAR CLEAR   Specific Gravity, Urine 1.013 1.005 - 1.030   pH 6.0 5.0 - 8.0   Glucose, UA NEGATIVE NEGATIVE mg/dL   Hgb urine dipstick  NEGATIVE NEGATIVE   Bilirubin Urine NEGATIVE NEGATIVE   Ketones, ur 5 (A) NEGATIVE mg/dL   Protein, ur NEGATIVE NEGATIVE mg/dL   Nitrite NEGATIVE NEGATIVE   Leukocytes,Ua NEGATIVE NEGATIVE    Comment: Performed at [facility], [address] [place], [place] [address]  Respiratory Panel by RT PCR (Flu A&B, Covid) - Nasopharyngeal Swab     Status: None   Collection Time: 07/17/19  8:40 PM   Specimen: Nasopharyngeal Swab  Result Value Ref Range   SARS Coronavirus 2 by RT PCR NEGATIVE NEGATIVE    Comment: (NOTE) SARS-CoV-2 target nucleic acids are NOT DETECTED. The SARS-CoV-2 RNA is generally detectable in upper respiratoy specimens during the acute phase of infection. The lowest concentration of SARS-CoV-2 viral copies this assay can detect is 131 copies/mL. A negative result does not preclude SARS-Cov-2 infection and should not be used as the sole basis for treatment or other patient management decisions. A negative result may occur with  improper specimen collection/handling, submission of specimen other than nasopharyngeal swab, presence of viral mutation(s) within the areas targeted by this assay, and inadequate number of viral copies (<131 copies/mL). A negative result must be combined with clinical observations, patient history, and epidemiological information. The expected result is Negative. Fact Sheet for Patients:  PinkCheek.be Fact Sheet for  Healthcare Providers:  GravelBags.it This test is not yet ap proved or cleared by the [place] FDA and  has been authorized for detection and/or diagnosis of SARS-CoV-2 by FDA under an Emergency Use Authorization (EUA). This EUA will remain  in effect (meaning this test can be used) for the duration of the COVID-19 declaration under Section 564(b)(1) of the Act, 21 U.S.C. section 360bbb-3(b)(1), unless the authorization is terminated or revoked sooner.    Influenza A by PCR NEGATIVE NEGATIVE   Influenza B by PCR NEGATIVE NEGATIVE    Comment: (NOTE) The Xpert Xpress SARS-CoV-2/FLU/RSV assay is intended as an aid in  the diagnosis of influenza from Nasopharyngeal swab specimens and  should not be used as a sole basis for treatment. Nasal washings and  aspirates are unacceptable for Xpert Xpress SARS-CoV-2/FLU/RSV  testing. Fact Sheet for Patients: PinkCheek.be Fact Sheet for Healthcare Providers: GravelBags.it This test is not yet approved or cleared by the [place] FDA and  has been authorized for detection and/or diagnosis of SARS-CoV-2 by  FDA under an Emergency Use Authorization (EUA). This EUA will remain  in effect (meaning this test can be used) for the duration of the  Covid-19 declaration under Section 564(b)(1) of the Act, 21  U.S.C. section 360bbb-3(b)(1), unless the authorization is  terminated or revoked. Performed at [facility], [address], [place], [place] [address]   Wet prep, genital     Status: Abnormal   Collection Time: 07/17/19  9:03 PM   Specimen: PATH Cytology Cervicovaginal Ancillary Only  Result Value Ref Range   Yeast Wet Prep HPF POC NONE SEEN NONE SEEN   Trich, Wet Prep NONE SEEN NONE SEEN   Clue Cells Wet Prep HPF POC PRESENT (A) NONE SEEN   WBC, Wet Prep HPF POC RARE (A) NONE SEEN   Sperm NONE SEEN     Comment: Performed at [facility], [address], [place], [place] [address]  Basic metabolic panel     Status: Abnormal   Collection Time: 07/18/19  4:33 AM  Result Value Ref Range   Sodium 139 135 - 145 mmol/L   Potassium 3.8 3.5 - 5.1 mmol/L  Chloride 109 98 - 111 mmol/L   CO2 23 22 - 32 mmol/L   Glucose, Bld 84 70 - 99 mg/dL    Comment: Glucose reference range applies only to samples taken after fasting for at least 8 hours.   BUN 12 6 - 20 mg/dL   Creatinine, Ser 9.47 0.44 - 1.00 mg/dL   Calcium 8.8 (L) 8.9 - 10.3 mg/dL   GFR calc non Af Amer >60 >60 mL/min   GFR calc Af Amer >60 >60 mL/min   Anion gap 7 5 - 15    Comment: Performed at [facility], [address] [place], [place] [address]  CBC with Differential/Platelet     Status: Abnormal   Collection Time: 07/18/19  4:33 AM  Result Value Ref Range   WBC 6.2 4.0 - 10.5 K/uL   RBC 3.81 (L) 3.87 - 5.11 MIL/uL   Hemoglobin 11.1 (L) 12.0 - 15.0 g/dL   HCT 03.5 (L) 46.5 - 68.1 %   MCV 91.1 80.0 - 100.0 fL   MCH 29.1 26.0 - 34.0 pg   MCHC 32.0 30.0 - 36.0 g/dL   RDW 27.5 17.0 - 01.7 %   Platelets 226 150 - 400 K/uL   nRBC 0.0 0.0 - 0.2 %   Neutrophils Relative % 59 %   Neutro Abs 3.7 1.7 - 7.7 K/uL   Lymphocytes Relative 25 %   Lymphs Abs 1.6 0.7 - 4.0 K/uL   Monocytes Relative 9 %   Monocytes Absolute 0.5 0.1 - 1.0 K/uL   Eosinophils Relative 5 %   Eosinophils Absolute 0.3 0.0 - 0.5 K/uL   Basophils Relative 1 %   Basophils Absolute 0.1 0.0 - 0.1 K/uL   Immature Granulocytes 1 %   Abs Immature Granulocytes 0.03 0.00 - 0.07 K/uL    Comment: Performed at [facility], [address], [place], [place] [address]   Personally reviewed CT-  Very dilated appendix with no obvious surrounding edema or stranding CT Abdomen Pelvis W Contrast  Result Date: 07/17/2019 CLINICAL DATA:  Acute generalized abdominal pain. EXAM: CT ABDOMEN AND PELVIS WITH CONTRAST TECHNIQUE: Multidetector CT imaging of the abdomen and pelvis was performed using the standard protocol following  bolus administration of intravenous contrast. CONTRAST:  OMNIPAQUE IOHEXOL 300 MG/ML  SOLN COMPARISON:  None. FINDINGS: Lower chest: No acute abnormality. Hepatobiliary: No focal liver abnormality is seen. No gallstones, gallbladder wall thickening, or biliary dilatation. Pancreas: Unremarkable. No pancreatic ductal dilatation or surrounding inflammatory changes. Spleen: Normal in size without focal abnormality. Adrenals/Urinary Tract: Adrenal glands appear normal. Small nonobstructive right renal calculus is noted. No hydronephrosis or renal obstruction is noted. Urinary bladder is unremarkable. Stomach/Bowel: The stomach appears normal. There is no evidence of bowel obstruction. However, the appendix is enlarged measuring 14 mm in maximum diameter. No definite surrounding inflammation is noted, but appendicitis cannot be excluded. Vascular/Lymphatic: No significant vascular findings are present. No enlarged abdominal or pelvic lymph nodes. Reproductive: Uterus and bilateral adnexa are unremarkable. Other: No abdominal wall hernia or abnormality. No abdominopelvic ascites. Musculoskeletal: No acute or significant osseous findings. IMPRESSION: 1. Enlarged appendix is noted measuring 14 mm in maximum diameter, but no definite surrounding inflammation is noted. This is concerning for acute appendicitis in the appropriate clinical setting. 2. Small nonobstructive right renal calculus. Electronically Signed   By: Lupita Raider M.D.   On: 07/17/2019 20:15     Assessment & Plan:  Schae Cando is a 39 y.o. female with what seems like  is early appendicitis with a dilated appendix on CT scan. We observed Anna Willis overnight but Anna Willis pain continues. Anna Willis does not have a leukocytosis which I warned Anna Willis could mean the appendix is normal. We discussed the options of a po trial and home versus a laparoscopic appendectomy. We discussed the risk of bleeding, infection, anesthesia, injury to other organs and a normal appendix.  Anna Willis would like to proceed with surgery. Anna Willis had a headache this AM and took Imitrex with a sip.   -NPO -Antibiotics on OCTOR -Laparoscopic appendectomy versus open  -Preop EKG ordered -COVID negative   All questions were answered to the satisfaction of the patient and family.   Lucretia Roers 07/18/2019, 10:18 AM

## 2019-07-18 NOTE — Anesthesia Procedure Notes (Signed)
Procedure Name: Intubation Date/Time: 07/18/2019 1:27 PM Performed by: Jonna Munro, CRNA Pre-anesthesia Checklist: Patient identified, Emergency Drugs available, Suction available, Patient being monitored and Timeout performed Patient Re-evaluated:Patient Re-evaluated prior to induction Oxygen Delivery Method: Circle system utilized Preoxygenation: Pre-oxygenation with 100% oxygen Induction Type: IV induction Laryngoscope Size: Mac and 3 Grade View: Grade I Tube type: Oral Number of attempts: 2 Airway Equipment and Method: Stylet Placement Confirmation: ETT inserted through vocal cords under direct vision,  positive ETCO2 and breath sounds checked- equal and bilateral Secured at: 21 cm Tube secured with: Tape Dental Injury: Teeth and Oropharynx as per pre-operative assessment

## 2019-07-19 ENCOUNTER — Emergency Department (HOSPITAL_COMMUNITY): Payer: No Typology Code available for payment source

## 2019-07-19 ENCOUNTER — Emergency Department (HOSPITAL_COMMUNITY)
Admission: EM | Admit: 2019-07-19 | Discharge: 2019-07-20 | Disposition: A | Discharge status: Home / Self Care | Payer: No Typology Code available for payment source | Attending: Emergency Medicine | Admitting: Emergency Medicine

## 2019-07-19 ENCOUNTER — Other Ambulatory Visit: Payer: Self-pay

## 2019-07-19 DIAGNOSIS — T481X5A Adverse effect of skeletal muscle relaxants [neuromuscular blocking agents], initial encounter: Secondary | ICD-10-CM | POA: Insufficient documentation

## 2019-07-19 DIAGNOSIS — Z87891 Personal history of nicotine dependence: Secondary | ICD-10-CM | POA: Diagnosis not present

## 2019-07-19 DIAGNOSIS — Z79899 Other long term (current) drug therapy: Secondary | ICD-10-CM | POA: Insufficient documentation

## 2019-07-19 DIAGNOSIS — Z20822 Contact with and (suspected) exposure to covid-19: Secondary | ICD-10-CM | POA: Diagnosis not present

## 2019-07-19 DIAGNOSIS — M7918 Myalgia, other site: Secondary | ICD-10-CM | POA: Diagnosis not present

## 2019-07-19 DIAGNOSIS — T887XXA Unspecified adverse effect of drug or medicament, initial encounter: Secondary | ICD-10-CM | POA: Insufficient documentation

## 2019-07-19 DIAGNOSIS — Z9889 Other specified postprocedural states: Secondary | ICD-10-CM | POA: Diagnosis not present

## 2019-07-19 DIAGNOSIS — M791 Myalgia, unspecified site: Secondary | ICD-10-CM

## 2019-07-19 LAB — GC/CHLAMYDIA PROBE AMP (~~LOC~~) NOT AT ARMC
Chlamydia: NEGATIVE
Comment: NEGATIVE
Comment: NORMAL
Neisseria Gonorrhea: NEGATIVE

## 2019-07-19 MED ORDER — MORPHINE SULFATE (PF) 4 MG/ML IV SOLN
4.0000 mg | Freq: Once | INTRAVENOUS | Status: AC
  Administered 2019-07-19: 4 mg via INTRAVENOUS
  Filled 2019-07-19: qty 1

## 2019-07-19 MED ORDER — SODIUM CHLORIDE 0.9 % IV BOLUS
1000.0000 mL | Freq: Once | INTRAVENOUS | Status: AC
  Administered 2019-07-19: 1000 mL via INTRAVENOUS

## 2019-07-19 MED ORDER — ONDANSETRON HCL 4 MG/2ML IJ SOLN
4.0000 mg | Freq: Once | INTRAMUSCULAR | Status: AC
  Administered 2019-07-19: 4 mg via INTRAVENOUS
  Filled 2019-07-19: qty 2

## 2019-07-19 NOTE — ED Triage Notes (Signed)
Pt with body aches from her neck down since s/p appendectomy yesterday, Dr. Henreitta Leber instructed pt to come here for her symptoms.

## 2019-07-19 NOTE — ED Provider Notes (Signed)
[facility] EMERGENCY DEPARTMENT Provider Note   CSN: [id number] Arrival date & time: 07/19/19  2220     History Chief Complaint  Patient presents with  . Post-op Problem  . Generalized Body Aches    Anna Willis is a 39 y.o. female with a past medical history of neurofibromatosis, migraines, anxiety, status post laparoscopic appendectomy on 07/18/2019, who presents today for evaluation of body aches from her neck down. She reports that when she woke up today she noted body wide pain.  She states that she feels like her muscles are tight and locking up from the legs up into her jaw.  She denies any fevers.  She reports that she has tolerated p.o. intake however denies having bowel movements or passing flatus.  No nausea or vomiting.  No fevers at home.  She denies any leg swelling.  She reports feeling like she has a gas pain on the right upper side.  HPI     Past Medical History:  Diagnosis Date  . Acid reflux   . Anxiety   . Migraine   . Neurofibromatosis ([place])   . Scoliosis   . Seasonal allergies   . Tachycardia     Patient Active Problem List   Diagnosis Date Noted  . Acute appendicitis   . Abdominal pain 07/17/2019  . Neurofibromatosis syndrome ([place]) 01/24/2019  . Obesity with body mass index 30 or greater 01/24/2019  . Menorrhagia 11/13/2018  . Dysmenorrhea 03/24/2018    Past Surgical History:  Procedure Laterality Date  . DILATION AND CURETTAGE OF UTERUS    . LAPAROSCOPIC APPENDECTOMY N/A 07/18/2019   Procedure: APPENDECTOMY LAPAROSCOPIC;  Surgeon: Virl Cagey, MD;  Location: [facility];  Service: General;  Laterality: N/A;  . TOE AMPUTATION     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   . WISDOM TOOTH EXTRACTION       OB History    Gravida  1   Para      Term      Preterm      AB  1   Living        SAB  1   TAB      Ectopic      Multiple      Live Births              Family History  Problem Relation Age of Onset  . Migraines  Mother   . Epilepsy Mother   . Endometriosis Mother        had hyst at age 42  . Neurofibromatosis Father   . Dementia Maternal Grandmother   . Kidney disease Maternal Grandmother   . Other Maternal Grandmother        shingles  . Heart attack Maternal Grandfather   . Breast cancer Paternal Aunt   . Breast cancer Paternal Aunt   . Breast cancer Paternal Aunt     Social History   Tobacco Use  . Smoking status: Former Smoker    Years: 10.00    Types: Cigarettes  . Smokeless tobacco: Never Used  Substance Use Topics  . Alcohol use: No  . Drug use: No    Home Medications Prior to Admission medications   Medication Sig Start Date End Date Taking? Authorizing Provider  Acetaminophen-Caff-Pyrilamine (MIDOL COMPLETE) 500-60-15 MG TABS Take 1 tablet by mouth daily as needed (for pain).     [provider]  ANUCORT-HC 25 MG suppository Place 1-2 suppositories rectally daily as needed for  hemorrhoids.  05/31/19   [provider]  buPROPion (WELLBUTRIN SR) 150 MG 12 hr tablet Take 150 mg by mouth every evening.  03/01/19   [provider]  cyclobenzaprine (FLEXERIL) 10 MG tablet Take 10 mg by mouth every 8 (eight) hours as needed for muscle spasms.  12/06/18   [provider]  diphenhydrAMINE HCl, Sleep, (ZZZQUIL) 25 MG CAPS Take 1-2 capsules by mouth at bedtime as needed (for sleep).    [provider]  docusate sodium (COLACE) 100 MG capsule Take 1 capsule (100 mg total) by mouth 2 (two) times daily. 07/18/19   Lucretia RoersBridges, Lindsay C, MD  esomeprazole (NEXIUM) 20 MG capsule Take 20 mg by mouth in the morning.     [provider]  ibuprofen (ADVIL,MOTRIN) 200 MG tablet Take 400-600 mg by mouth daily as needed for mild pain or moderate pain.     [provider]  JUBLIA 10 % SOLN Apply 1 application topically daily. 48 week course starting on 04/18/2019 04/18/19   [provider]  megestrol (MEGACE) 40 MG tablet TAKE 3 TABLETS BY  MOUTH DAILY FOR 5 DAYS, 2 DAILY FOR 5 DAYS, THEN 1 DAILY Patient taking differently: Take 40 mg by mouth in the morning.  05/15/19   Lazaro ArmsEure, Luther H, MD  methocarbamol (ROBAXIN) 500 MG tablet Take 1 tablet (500 mg total) by mouth every 8 (eight) hours as needed for muscle spasms. 07/20/19   Cristina GongHammond, Courtenay Hirth W, PA-C  metoprolol succinate (TOPROL-XL) 25 MG 24 hr tablet Take 25 mg by mouth every evening.     [provider]  ondansetron (ZOFRAN ODT) 4 MG disintegrating tablet Take 1 tablet (4 mg total) by mouth every 6 (six) hours as needed for nausea. 07/18/19   Lucretia RoersBridges, Lindsay C, MD  oxyCODONE (OXY IR/ROXICODONE) 5 MG immediate release tablet Take 1 tablet (5 mg total) by mouth every 4 (four) hours as needed for severe pain or breakthrough pain. 07/18/19   Lucretia RoersBridges, Lindsay C, MD  SUMAtriptan (IMITREX) 100 MG tablet Take 50-100 mg by mouth every 2 (two) hours as needed for migraine or headache.  12/12/18   [provider]    Allergies    Amoxicillin, Rocephin [ceftriaxone], and Erythromycin  Review of Systems   Review of Systems  Constitutional: Negative for chills and fever.  HENT: Negative for congestion.   Eyes: Negative for visual disturbance.  Respiratory: Negative for cough and shortness of breath.   Cardiovascular: Negative for chest pain, palpitations and leg swelling.  Gastrointestinal: Positive for abdominal pain.  Genitourinary: Negative for dysuria and urgency.  Musculoskeletal: Positive for myalgias.  Neurological: Negative for speech difficulty and headaches.  Psychiatric/Behavioral: Negative for confusion.  All other systems reviewed and are negative.   Physical Exam Updated Vital Signs BP 122/80   Pulse 91   Temp 98 F (36.7 C) (Oral)   Resp (!) 27   LMP 07/18/2019 (Exact Date) Comment: Pt on megace to stop hormonal bleeding / neg preg test today  SpO2 100%   Physical Exam Vitals and nursing note reviewed.  Constitutional:      General: She is not in  acute distress.    Appearance: She is well-developed.  HENT:     Head: Normocephalic and atraumatic.  Eyes:     Conjunctiva/sclera: Conjunctivae normal.  Cardiovascular:     Rate and Rhythm: Normal rate and regular rhythm.     Pulses: Normal pulses.          Radial pulses are  2+ on the right side and 2+ on the left side.       Dorsalis pedis pulses are 2+ on the right side and 2+ on the left side.       Posterior tibial pulses are 2+ on the right side and 2+ on the left side.     Heart sounds: Normal heart sounds. No murmur.  Pulmonary:     Effort: Pulmonary effort is normal. No accessory muscle usage or respiratory distress.     Breath sounds: Normal breath sounds.  Abdominal:     General: Bowel sounds are decreased. There is no distension.     Palpations: Abdomen is soft.     Tenderness: There is generalized abdominal tenderness.     Comments: Abdomen is minimally tender, consistent with postoperative state.  Compared to her initial evaluation when she was admitted 2 days ago her abdomen is less tender. Surgical incision sites with mild ecchymosis without drainage, dehiscence or abnormal erythema/induration.  Musculoskeletal:     Cervical back: Full passive range of motion without pain, normal range of motion and neck supple.     Right lower leg: No edema.     Left lower leg: No edema.     Comments: Compartments in bilateral arms and legs are soft and easily compressible.  Skin:    General: Skin is warm and dry.  Neurological:     Mental Status: She is alert.     GCS: GCS eye subscore is 4. GCS verbal subscore is 5. GCS motor subscore is 6.     Sensory: Sensation is intact.     Comments: Patient has difficulty lifting her bilateral legs off the bed.  She is mildly tremulous.  No inducible clonus. She is awake and alert, speech is not slurred.  5/5 grip strength bilaterally however she reports pain with strength testing.  She is able to ambulate.      ED Results / Procedures /  Treatments   Labs (all labs ordered are listed, but only abnormal results are displayed) Labs Reviewed  CBC WITH DIFFERENTIAL/PLATELET - Abnormal; Notable for the following components:      Result Value   Hemoglobin 11.7 (*)    HCT 35.6 (*)    All other components within normal limits  COMPREHENSIVE METABOLIC PANEL - Abnormal; Notable for the following components:   Glucose, Bld 105 (*)    Calcium 8.4 (*)    All other components within normal limits  CK - Abnormal; Notable for the following components:   Total CK 397 (*)    All other components within normal limits  RESPIRATORY PANEL BY RT PCR (FLU A&B, COVID)  LIPASE, BLOOD  MAGNESIUM    EKG EKG Interpretation  Date/Time:  Friday July 20 2019 00:11:53 EDT Ventricular Rate:  97 PR Interval:    QRS Duration: 85 QT Interval:  379 QTC Calculation: 482 R Axis:   72 Text Interpretation: Sinus rhythm Nonspecific T abnormalities, anterior leads No previous ECGs available Confirmed by Glynn Octave [id number]) on 07/20/2019 12:23:56 AM   Radiology DG Abd Acute W/Chest  Result Date: 07/20/2019 CLINICAL DATA:  Generalized body aches and weakness status post recent appendectomy. EXAM: DG ABDOMEN ACUTE W/ 1V CHEST COMPARISON:  None. FINDINGS: There is no evidence of dilated bowel loops. A very small amount of free air seen just below the right hemidiaphragm. A moderate amount of stool is seen in the ascending colon. No radiopaque calculi or other significant radiographic abnormality is seen. Heart size and  mediastinal contours are within normal limits. Both lungs are clear. IMPRESSION: 1. Very small amount of free air just below the right hemidiaphragm which is likely due to recent appendectomy. 2. No acute or active cardiopulmonary disease. Electronically Signed   By: Aram Candela M.D.   On: 07/20/2019 01:03    Procedures Procedures (including critical care time)  Medications Ordered in ED Medications  sodium chloride 0.9 % bolus  1,000 mL (0 mLs Intravenous Stopped 07/20/19 0129)  morphine 4 MG/ML injection 4 mg (4 mg Intravenous Given 07/19/19 2347)  ondansetron (ZOFRAN) injection 4 mg (4 mg Intravenous Given 07/19/19 2347)    ED Course  I have reviewed the triage vital signs and the nursing notes.  Pertinent labs & imaging results that were available during my care of the patient were reviewed by me and considered in my medical decision making (see chart for details).    MDM Rules/Calculators/A&P                     Patient is a 39 year old woman who presents today for evaluation of myalgias.  She is postop day 2 from laparoscopic appendectomy. She reports significant myalgias diffusely through her body.  On exam she is noted to have difficulty lifting her bilateral legs off the bed and appears tremulous.  She does not have inducible clonus.  She is not having any headache.  The symptoms were present since this morning.  CBC and CMP without significant acute hematologic or electrolyte derangements.  Lipase is not elevated.  Magnesium is 2.2, normal.  CK is barely elevated at 397.  Covid testing is negative.  Patient is given IV fluids, IV morphine and Zofran while in the emergency room.  Acute abdominal series was obtained without evidence of obstruction.  She does have decreased abdominal sounds however is tolerating p.o. at home without significant abdominal pain or nausea.  This patient was seen as a shared visit with Dr. Consuella Lose.  Telemetry neurology was consulted who saw the patient and agrees with concern for post succinylcholine myalgias as chart review shows she received succinylcholine. Additionally they recommended muscle relaxers and fluid hydration. I spoke with Dr. Henreitta Leber, patient's surgeon, who had recommended patient present to the emergency room.  We discussed results.  Patient does have minimal free air in her abdomen however given that she is postop day 2 this is consistent with her recent surgery.  She  is not having significant abdominal pain.  She was given 1 L of IV fluids here in the emergency room.  As she is tolerating p.o. we discussed admission for continued IV fluids and observation versus discharge home.  Patient, through shared decision-making, elected for oral rehydration at home. She is given extended work note.  She is given a prescription for Robaxin at the recommendation of telemetry neurology who recommended a muscle relaxer.  Return precautions were discussed with patient who states their understanding.  At the time of discharge patient denied any unaddressed complaints or concerns.  Patient is agreeable for discharge home.  Note: Portions of this report may have been transcribed using voice recognition software. Every effort was made to ensure accuracy; however, inadvertent computerized transcription errors may be present  Final Clinical Impression(s) / ED Diagnoses Final diagnoses:  Myalgia  Succinylcholine adverse reaction, initial encounter  Post-operative state    Rx / DC Orders ED Discharge Orders         Ordered    methocarbamol (ROBAXIN) 500 MG tablet  Every  8 hours PRN     07/20/19 0229           Cristina Gong, PA-C 07/20/19 0300    Glynn Octave, MD 07/20/19 2055

## 2019-07-20 ENCOUNTER — Telehealth: Payer: Self-pay

## 2019-07-20 LAB — CBC WITH DIFFERENTIAL/PLATELET
Abs Immature Granulocytes: 0.02 10*3/uL (ref 0.00–0.07)
Basophils Absolute: 0.1 10*3/uL (ref 0.0–0.1)
Basophils Relative: 1 %
Eosinophils Absolute: 0.2 10*3/uL (ref 0.0–0.5)
Eosinophils Relative: 3 %
HCT: 35.6 % — ABNORMAL LOW (ref 36.0–46.0)
Hemoglobin: 11.7 g/dL — ABNORMAL LOW (ref 12.0–15.0)
Immature Granulocytes: 0 %
Lymphocytes Relative: 29 %
Lymphs Abs: 2.2 10*3/uL (ref 0.7–4.0)
MCH: 29.7 pg (ref 26.0–34.0)
MCHC: 32.9 g/dL (ref 30.0–36.0)
MCV: 90.4 fL (ref 80.0–100.0)
Monocytes Absolute: 0.8 10*3/uL (ref 0.1–1.0)
Monocytes Relative: 10 %
Neutro Abs: 4.3 10*3/uL (ref 1.7–7.7)
Neutrophils Relative %: 57 %
Platelets: 261 10*3/uL (ref 150–400)
RBC: 3.94 MIL/uL (ref 3.87–5.11)
RDW: 12.6 % (ref 11.5–15.5)
WBC: 7.5 10*3/uL (ref 4.0–10.5)
nRBC: 0 % (ref 0.0–0.2)

## 2019-07-20 LAB — COMPREHENSIVE METABOLIC PANEL
ALT: 23 U/L (ref 0–44)
AST: 27 U/L (ref 15–41)
Albumin: 3.8 g/dL (ref 3.5–5.0)
Alkaline Phosphatase: 60 U/L (ref 38–126)
Anion gap: 6 (ref 5–15)
BUN: 19 mg/dL (ref 6–20)
CO2: 23 mmol/L (ref 22–32)
Calcium: 8.4 mg/dL — ABNORMAL LOW (ref 8.9–10.3)
Chloride: 110 mmol/L (ref 98–111)
Creatinine, Ser: 0.76 mg/dL (ref 0.44–1.00)
GFR calc Af Amer: >60 mL/min (ref >60)
GFR calc non Af Amer: >60 mL/min (ref >60)
Glucose, Bld: 105 mg/dL — ABNORMAL HIGH (ref 70–99)
Potassium: 3.5 mmol/L (ref 3.5–5.1)
Sodium: 139 mmol/L (ref 135–145)
Total Bilirubin: 0.4 mg/dL (ref 0.3–1.2)
Total Protein: 7.1 g/dL (ref 6.5–8.1)

## 2019-07-20 LAB — RESPIRATORY PANEL BY RT PCR (FLU A&B, COVID)
Influenza A by PCR: NEGATIVE
Influenza B by PCR: NEGATIVE
SARS Coronavirus 2 by RT PCR: NEGATIVE

## 2019-07-20 LAB — MAGNESIUM: Magnesium: 2.2 mg/dL (ref 1.7–2.4)

## 2019-07-20 LAB — LIPASE, BLOOD: Lipase: 33 U/L (ref 11–51)

## 2019-07-20 LAB — SURGICAL PATHOLOGY

## 2019-07-20 LAB — CK: Total CK: 397 U/L — ABNORMAL HIGH (ref 38–234)

## 2019-07-20 MED ORDER — METHOCARBAMOL 500 MG PO TABS: 500.0000 mg | ORAL_TABLET | Freq: Three times a day (TID) | ORAL | 0 refills | Status: DC | PRN

## 2019-07-20 MED FILL — METHOCARBAMOL 500 MG TABS: 500 | 5 days supply | Qty: 15 | Fill #0

## 2019-07-20 NOTE — Telephone Encounter (Signed)
Left message for patient to return call to office.  Patient;s wife left message on nurse line yesterday- called to say patient had laparoscopic appendectomy yesterday and she is having gas pains that is radiating up to her left shoulder-

## 2019-07-20 NOTE — Consult Note (Signed)
   [facility] TeleNeurology Consult Services  Stat Consult  Date of Service:   07/20/2019 01:20:45  Impression:     .  R10.84 - Generalized body Pain  Comments/Sign-Out: Patient presented with generalized body pain. On evaluation patient able to move around with no effort once started. Review showed some mild CK elevation. Concern etiology of myalgia is due to succinylcholine induced myalgia. WOuld recommend hydration, muscle relaxant, stretching exercise.  Metrics: [facility] Notification Time: 07/20/2019 01:19:23 Stamp Time: 07/20/2019 01:20:45 Callback Response Time: 07/20/2019 01:22:32  Our recommendations are outlined below.  Recommendations:     .  Stretching exercises     .  Flexeril PRN or any muscle relaxant.    Disposition: Sign Off  Sign Out:     .  Discussed with Emergency Department Provider  ----------------------------------------------------------------------------------------------------  Chief Complaint: Pain  History of Present Illness: Patient is a 39 year old Female.  PMH NF type 1 Woke up this morning around 0630 complains of generalized pain. Everywhere in jaws teeth, neck, butt muscle. Since she woke up it started legs and abdomen and arms. Associated with sitting No problem walking, able to walk herself to the hospital. Recent Appendectomy on 4/6-4/7 Taken 3 oxycodone and Tylenol no relief of the pain but only the incision.           Examination: BP(104/72), Pulse(87), Blood Glucose(105)  Neuro Exam:  General: Alert,Awake, Oriented to Time, Place, Person  Speech: Fluent:  Language: Intact:  Face: Symmetric:  Facial Sensation: Intact:  Visual Fields: Intact:  Extraocular Movements: Intact:  Motor Exam: No Drift:  Sensation: Intact:  Coordination: Intact:  Able to walk with normal gait.   Patient/Family was informed the Neurology Consult would occur via TeleHealth consult by way of interactive audio and video  telecommunications and consented to receiving care in this manner.    Dr Letta Kocher Jsiah Menta   [facility] [phone]  Case [id number]

## 2019-07-20 NOTE — Discharge Instructions (Addendum)
As we discussed today we suspect that your symptoms are related to the jerking movements that you can get with medicines for anesthesia. Specifically succinylcholine can cause myalgias from the myoclonic movements when you get paralyzed. Not all paralytics will do this, nondepolarizing paralytics/neuromuscular blocking agents such as rocuronium or vecuronium may not have this effect on you.  Please make sure you are drinking plenty of fluid.  As we discussed today your muscle breakdown number, creatinine kinase, is minimally elevated.  Today you received medications that may make you sleepy or impair your ability to make decisions.  For the next 24 hours please do not drive, operate heavy machinery, care for a small child with out another adult present, or perform any activities that may cause harm to you or someone else if you were to fall asleep or be impaired.   You are being prescribed a medication which may make you sleepy. Please follow up of listed precautions for at least 24 hours after taking one dose.  I would recommend that you start taking MiraLAX every day.

## 2019-07-31 MED FILL — buPROPion HCL ER (XL) 150 M: 150 | 30 days supply | Qty: 30 | Fill #2

## 2019-08-02 ENCOUNTER — Telehealth (INDEPENDENT_AMBULATORY_CARE_PROVIDER_SITE_OTHER): Payer: Self-pay | Admitting: General Surgery

## 2019-08-02 DIAGNOSIS — K3580 Unspecified acute appendicitis: Secondary | ICD-10-CM

## 2019-08-02 NOTE — Progress Notes (Signed)
[facility]  I am calling the patient for post operative evaluation due to the current COVID 19 pandemic.  The patient had a laparoscopic appendectomy on 07/18/2019. I left her a voice mail and also called her wife, Shakenya. Notified them that the pathology was as expect with acute appendicitis.  Notified the patient and wife to call with questions or concerns. Activity and diet as tolerated.   Will see the patient PRN.   Pathology: FINAL MICROSCOPIC DIAGNOSIS:   A. APPENDIX, APPENDECTOMY:  - Acute appendicitis.   Algis Greenhouse, MD [facility] [address] [place], [place] [address] [phone] (office)

## 2019-08-13 MED FILL — METOPROLOL SUCCINATE ER 25: 25 | 90 days supply | Qty: 90 | Fill #1

## 2019-08-13 MED FILL — MEGESTROL 40 MG TABLET: 40 | 45 days supply | Qty: 45 | Fill #2

## 2019-08-22 ENCOUNTER — Other Ambulatory Visit (HOSPITAL_COMMUNITY): Payer: Self-pay | Admitting: Internal Medicine

## 2019-08-31 MED FILL — buPROPion HCL ER (XL) 150 M: 150 | 30 days supply | Qty: 30 | Fill #0

## 2019-09-03 MED FILL — SUMAtriptan SUCCINATE 100 M: 100 | 30 days supply | Qty: 9 | Fill #5

## 2019-09-04 ENCOUNTER — Telehealth: Payer: Self-pay | Admitting: Obstetrics & Gynecology

## 2019-09-04 NOTE — Telephone Encounter (Signed)
Pt wife called stating that she would also like a followup call on when they are scheduling the mammogram so that she can be home with her after sx

## 2019-09-04 NOTE — Telephone Encounter (Signed)
My major surgeries are on Wednesday which would be a choice of 6/23 or 6/30 which is on either side of the dates she gave  She would leave the hospital the following day

## 2019-09-04 NOTE — Telephone Encounter (Signed)
Patient is interested in scheduling hysterectomy for late June 6/25-6/28 which is when her wife is off work who can be with her. This is also when she is out of school for break for summer.

## 2019-09-07 ENCOUNTER — Telehealth: Payer: Self-pay | Admitting: Obstetrics & Gynecology

## 2019-09-07 NOTE — Telephone Encounter (Signed)
I have sent response via MyChart

## 2019-09-07 NOTE — Telephone Encounter (Signed)
Patient spouse called office to notify Dr. Despina Hidden that she is at work today and would like him to contact her at her office number [phone] her and spouse have a lot of questions regarding the sx and just wants to make sure she has things in order for taking fmla through her employer and having things in place.

## 2019-09-07 NOTE — Telephone Encounter (Signed)
Deb's wife Dove would like for EUre to give her a call today she has some questions because she cant come to the pre op with Morrie Sheldon. SHe would like to know how long procedure should last and how long she needs to plan on being out of work with her. Call Alandria cell at [phone] she is in clinic today and is going to try and have her phone close by if she does not answer she would like for you to call her front office at [phone] they are aware they will need to get her on the phone.  Thank you

## 2019-09-14 ENCOUNTER — Encounter: Payer: Self-pay | Admitting: Obstetrics & Gynecology

## 2019-09-14 ENCOUNTER — Ambulatory Visit: Payer: No Typology Code available for payment source | Admitting: Obstetrics & Gynecology

## 2019-09-14 VITALS — BP 112/80 | HR 84 | Ht 60.0 in | Wt 162.5 lb

## 2019-09-14 DIAGNOSIS — N941 Unspecified dyspareunia: Secondary | ICD-10-CM

## 2019-09-14 DIAGNOSIS — N920 Excessive and frequent menstruation with regular cycle: Secondary | ICD-10-CM

## 2019-09-14 DIAGNOSIS — N946 Dysmenorrhea, unspecified: Secondary | ICD-10-CM

## 2019-09-14 NOTE — Progress Notes (Signed)
Patient ID: Anna Willis, female   DOB: [birth year], 39 y.o.   MRN: [id number] Preoperative History and Physical  Anna Willis is a 39 y.o. G1P0010 with No LMP recorded. (Menstrual status: Other). admitted for a total abdominal hysterectomy with bilateral salpingectomy, using a mini laparotomy incision The issues this patient has had dates back many years I will summarize the most recent 18 months that has brought [place] to the point of definitive surgical management The patient was seen in the emergency department in December 2019 with a complaint of almost continuous bleeding She would essentially bleed at that time every 11 days with clots and severe cramping associated The emergency department contacted me at that point and I placed her on megestrol therapy in the hopes of complete endometrial suppression I then saw her in the office on April 28, 2018 and continued her on megestrol continuously for the ensuing 3 months Her hemoglobin at that time was 11.5 The sonogram she had performed through her emergency department visit in December 2019 was essentially normal and noncontributory. By April 2020 the patient had been made amenorrhea on the chronic Megace therapy and wanted to continue that as she was doing so much better Unfortunately the patient experienced approximately a 25 pound weight gain by August 2020 and while she was for the most part amenorrheic she also had breakthrough bleeding A conversation with her primary care provided prompted her to seek conversation versus placement of an intrauterine device which she ultimately declined She decided to go off the megestrol and try Lysteda and by October 2020 she was back in the emergency department with uncontrolled vaginal bleeding once again At that time she was given medroxyprogesterone 300 mg IM to shut down her bleeding acutely and megestrol for ongoing management By December 2020 during the visit in my office we started  discussing definitive therapy as we were unable to control her bleeding with anything other than megestrol but she was having ongoing problems with weight gain She was also having more problems with emotional lability as a result of the megestrol She was also subsequently developing dyspareunia 100% of the time which may or may not be related to megestrol therapy my impression not related Due to life circumstances she was inclined to delay any surgical management till later in 2021 and she remained on her megestrol  During today's visit Anna Willis's wife Rex was on the call for the entire time during the visit The patient actually and I spent greater than 30 minutes discussing the surgery and what is likely to improve and maybe not improve Because of her 100% dyspareunia which on exam is related to cervical manipulation or bump dyspareunia it certainly is indicated for a complete uterine and cervix removal to be performed She is not having chronic adnexal pain Her tubes will be removed for ovarian cancer prophylaxis indication Aaminah Forrester also has some episodic random out of the blue sharp abdominal pain which I do not believe is related to her GYN issues She does have similar pain after intercourse but it is also somewhat random My suspicion is this is gastroenterology related and it seems to be bowel spasm especially as it is related to breaking out in a cold sweat and feeling nauseated at the same time In any event we discussed the likelihood of this not improving with her surgery but certainly because of her ongoing menometrorrhagia and dysfunctional uterine bleeding while on anything other than megestrol certainly this will be managed by the  hysterectomy Her dyspareunia will also be eliminated because were doing a complete uterine and cervix removal It certainly possible that she could have endometriosis and she understands that we will be looking for that intraoperatively A repeat sonogram was  performed in April in our office and again did not show any abnormal pelvic anatomical findings  The patient and her wife understand the indications for procedure the alternatives which we have exhausted and the complications that may arise as a result of surgery including postoperative infection, excessive bleeding, anesthesia related complications as well as thromboembolic events.  We will certainly take perioperative steps to minimize all of these risk.  All questions were answered and the decision was made to go forward later this month.   PMH:    Past Medical History:  Diagnosis Date  . Acid reflux   . Anxiety   . Migraine   . Neurofibromatosis (HCC)   . Scoliosis   . Seasonal allergies   . Tachycardia     PSH:     Past Surgical History:  Procedure Laterality Date  . DILATION AND CURETTAGE OF UTERUS    . LAPAROSCOPIC APPENDECTOMY N/A 07/18/2019   Procedure: APPENDECTOMY LAPAROSCOPIC;  Surgeon: Lucretia Roers, MD;  Location: [facility];  Service: General;  Laterality: N/A;  . TOE AMPUTATION     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   . WISDOM TOOTH EXTRACTION      POb/GynH:      OB History    Gravida  1   Para      Term      Preterm      AB  1   Living        SAB  1   TAB      Ectopic      Multiple      Live Births              SH:   Social History   Tobacco Use  . Smoking status: Former Smoker    Years: 10.00    Types: Cigarettes  . Smokeless tobacco: Never Used  Vaping Use  . Vaping Use: Never used  Substance Use Topics  . Alcohol use: No  . Drug use: No    FH:    Family History  Problem Relation Age of Onset  . Migraines Mother   . Epilepsy Mother   . Endometriosis Mother        had hyst at age 52  . Neurofibromatosis Father   . Dementia Maternal Grandmother   . Kidney disease Maternal Grandmother   . Other Maternal Grandmother        shingles  . Heart attack Maternal Grandfather   . Breast cancer Paternal Aunt   . Breast cancer  Paternal Aunt   . Breast cancer Paternal Aunt      Allergies:  Allergies  Allergen Reactions  . Amoxicillin Nausea And Vomiting    Projectile vomiting  . Rocephin [Ceftriaxone] Nausea And Vomiting  . Succinylcholine Other (See Comments)    Muscle weakness/muscle pain.  . Erythromycin Rash    Medications:       Current Outpatient Medications:  .  Acetaminophen-Caff-Pyrilamine (MIDOL COMPLETE) 500-60-15 MG TABS, Take 1 tablet by mouth daily as needed (pain/cramps). , Disp: , Rfl:  .  ANUCORT-HC 25 MG suppository, Place 1-2 suppositories rectally daily as needed for hemorrhoids. , Disp: , Rfl:  .  buPROPion (WELLBUTRIN SR) 150 MG 12 hr tablet, Take 150 mg by  mouth daily with supper. , Disp: , Rfl:  .  cyclobenzaprine (FLEXERIL) 10 MG tablet, Take 10 mg by mouth every 8 (eight) hours as needed for muscle spasms (muscle pain.). , Disp: , Rfl:  .  diphenhydrAMINE HCl, Sleep, (ZZZQUIL) 25 MG CAPS, Take 50 capsules by mouth at bedtime. , Disp: , Rfl:  .  docusate sodium (COLACE) 100 MG capsule, Take 1 capsule (100 mg total) by mouth 2 (two) times daily. (Patient taking differently: Take 100 mg by mouth at bedtime. ), Disp: 60 capsule, Rfl: 1 .  esomeprazole (NEXIUM) 20 MG capsule, Take 20 mg by mouth daily before breakfast. , Disp: , Rfl:  .  ibuprofen (ADVIL,MOTRIN) 200 MG tablet, Take 400-600 mg by mouth daily as needed for mild pain or moderate pain. , Disp: , Rfl:  .  JUBLIA 10 % SOLN, Apply 1 application topically 4 (four) times a week. 48 week course starting on 04/18/2019, Disp: , Rfl:  .  megestrol (MEGACE) 40 MG tablet, TAKE 3 TABLETS BY MOUTH DAILY FOR 5 DAYS, 2 DAILY FOR 5 DAYS, THEN 1 DAILY (Patient taking differently: Take 40 mg by mouth daily. ), Disp: 45 tablet, Rfl: 3 .  metoprolol succinate (TOPROL-XL) 25 MG 24 hr tablet, Take 25 mg by mouth daily with supper. , Disp: , Rfl:  .  ondansetron (ZOFRAN ODT) 4 MG disintegrating tablet, Take 1 tablet (4 mg total) by mouth every 6  (six) hours as needed for nausea., Disp: 20 tablet, Rfl: 0 .  SUMAtriptan (IMITREX) 100 MG tablet, Take 50 mg by mouth every 2 (two) hours as needed for migraine or headache. , Disp: , Rfl:  .  acetaminophen (TYLENOL) 500 MG tablet, Take 500-1,000 mg by mouth every 6 (six) hours as needed (pain.)., Disp: , Rfl:  .  Erenumab-aooe (AIMOVIG [place]ar), Inject 1 Dose into the skin every 30 (thirty) days., Disp: , Rfl:  .  methocarbamol (ROBAXIN) 500 MG tablet, Take 1 tablet (500 mg total) by mouth every 8 (eight) hours as needed for muscle spasms. (Patient not taking: Reported on 09/14/2019), Disp: 15 tablet, Rfl: 0 .  polyethylene glycol (MIRALAX / GLYCOLAX) 17 g packet, Take 17 g by mouth daily., Disp: , Rfl:   Review of Systems:   Review of Systems  Constitutional: Negative for fever, chills, weight loss, malaise/fatigue and diaphoresis.  HENT: Negative for hearing loss, ear pain, nosebleeds, congestion, sore throat, neck pain, tinnitus and ear discharge.   Eyes: Negative for blurred vision, double vision, photophobia, pain, discharge and redness.  Respiratory: Negative for cough, hemoptysis, sputum production, shortness of breath, wheezing and stridor.   Cardiovascular: Negative for chest pain, palpitations, orthopnea, claudication, leg swelling and PND.  Gastrointestinal: Positive for abdominal pain. Negative for heartburn, nausea, vomiting, diarrhea, constipation, blood in stool and melena.  Genitourinary: Negative for dysuria, urgency, frequency, hematuria and flank pain.  Musculoskeletal: Negative for myalgias, back pain, joint pain and falls.  Skin: Negative for itching and rash.  Neurological: Negative for dizziness, tingling, tremors, sensory change, speech change, focal weakness, seizures, loss of consciousness, weakness and headaches.  Endo/Heme/Allergies: Negative for environmental allergies and polydipsia. Does not bruise/bleed easily.  Psychiatric/Behavioral: Negative for depression, suicidal  ideas, hallucinations, memory loss and substance abuse. The patient is not nervous/anxious and does not have insomnia.      PHYSICAL EXAM:  Blood pressure 112/80, pulse 84, height 5' (1.524 m), weight 162 lb 8 oz (73.7 kg).    Vitals reviewed. Constitutional: She is oriented to person,  place, and time. She appears well-developed and well-nourished.  HENT:  Head: Normocephalic and atraumatic.  Right Ear: External ear normal.  Left Ear: External ear normal.  Nose: Nose normal.  Mouth/Throat: Oropharynx is clear and moist.  Eyes: Conjunctivae and EOM are normal. Pupils are equal, round, and reactive to light. Right eye exhibits no discharge. Left eye exhibits no discharge. No scleral icterus.  Neck: Normal range of motion. Neck supple. No tracheal deviation present. No thyromegaly present.  Cardiovascular: Normal rate, regular rhythm, normal heart sounds and intact distal pulses.  Exam reveals no gallop and no friction rub.   No murmur heard. Respiratory: Effort normal and breath sounds normal. No respiratory distress. She has no wheezes. She has no rales. She exhibits no tenderness.  GI: Soft. Bowel sounds are normal. She exhibits no distension and no mass. There is tenderness. There is no rebound and no guarding.  Genitourinary:       Vulva is normal without lesions Vagina is pink moist without discharge Cervix normal in appearance and pap is normal Uterus is normal size, contour, position, consistency, mobility, non-tender Adnexa is negative with normal sized ovaries by sonogram  Musculoskeletal: Normal range of motion. She exhibits no edema and no tenderness.  Neurological: She is alert and oriented to person, place, and time. She has normal reflexes. She displays normal reflexes. No cranial nerve deficit. She exhibits normal muscle tone. Coordination normal.  Skin: Skin is warm and dry. No rash noted. No erythema. No pallor.  Psychiatric: She has a normal mood and affect. Her  behavior is normal. Judgment and thought content normal.    Labs: No results found for this or any previous visit (from the past 336 hour(s)).  EKG: Orders placed or performed during the hospital encounter of 07/19/19  . ED EKG  . ED EKG  . EKG 12-Lead  . EKG 12-Lead    Imaging Studies: No results found.    Assessment: Menometrorrhagia/DUB Dysmenorrhea Dyspareunia, deep Weight gain on megestrol therapy Patient Active Problem List   Diagnosis Date Noted  . Acute appendicitis   . Abdominal pain 07/17/2019  . Neurofibromatosis syndrome (HCC) 01/24/2019  . Obesity with body mass index 30 or greater 01/24/2019  . Menorrhagia 11/13/2018  . Dysmenorrhea 03/24/2018    Plan: TAH BS thru mini lap incision, planning for later in June  Pt understands the risks of surgery including but not limited t  excessive bleeding requiring transfusion or reoperation, post-operative infection requiring prolonged hospitalization or re-hospitalization and antibiotic therapy, and damage to other organs including bladder, bowel, ureters and major vessels.  The patient also understands the alternative treatment options which were discussed in full.  All questions were answered.  [name] 09/21/2019 9:33 AM   [name] 09/21/2019 9:33 AM      Face to face time:  30 minutes  Greater than 50% of the visit time was spent in counseling and coordination of care with the patient.  The summary and outline of the counseling and care coordination is summarized in the note above.   All questions were answered.

## 2019-09-19 ENCOUNTER — Encounter: Payer: Self-pay | Admitting: *Deleted

## 2019-09-25 MED FILL — AIMOVIG 70 MG/ML SOAJ: 70 | 30 days supply | Qty: 1 | Fill #1

## 2019-09-25 NOTE — Patient Instructions (Signed)
Anna Willis  09/25/2019     @   Your procedure is scheduled on  10/03/2019 .  Report to [facility] at  0915  A.M.  Call this number if you have problems the morning of surgery:  [phone]   Remember:  Do not eat or drink after midnight.                        Take these medicines the morning of surgery with A SIP OF WATER  Flexeril (if needed), nexium, metoprolol, zofran(if needed), imitrex.    Do not wear jewelry, make-up or nail polish.  Do not wear lotions, powders, or perfumes. Please wear deodorant and brush your teeth.  Do not shave 48 hours prior to surgery.  Men may shave face and neck.  Do not bring valuables to the hospital.  [facility] is not responsible for any belongings or valuables.  Contacts, dentures or bridgework may not be worn into surgery.  Leave your suitcase in the car.  After surgery it may be brought to your room.  For patients admitted to the hospital, discharge time will be determined by your treatment team.  Patients discharged the day of surgery will not be allowed to drive home.   Name and phone number of your driver:   family Special instructions:  DO NOT smoke the morning of your procedure.  Please read over the following fact sheets that you were given. Anesthesia Post-op Instructions and Care and Recovery After Surgery       Salpingectomy, Care After This sheet gives you information about how to care for yourself after your procedure. Your health care provider may also give you more specific instructions. If you have problems or questions, contact your health care provider. What can I expect after the procedure? After the procedure, it is common to have:  Pain in your abdomen.  Some light vaginal bleeding (spotting) for a few days.  Tiredness. Your recovery time will vary depending on which method your surgeon used for your surgery. Follow these instructions at home: Incision  care   Follow instructions from your health care provider about how to take care of your incisions. Make sure you: ? Wash your hands with soap and water before and after you change your bandage (dressing). If soap and water are not available, use hand sanitizer. ? Change or remove your dressing as told by your health care provider. ? Leave any stitches (sutures), skin glue, or adhesive strips in place. These skin closures may need to stay in place for 2 weeks or longer. If adhesive strip edges start to loosen and curl up, you may trim the loose edges. Do not remove adhesive strips completely unless your health care provider tells you to do that.  Keep your dressing clean and dry.  Check your incision area every day for signs of infection. Check for: ? Redness, swelling, or pain that gets worse. ? Fluid or blood. ? Warmth. ? Pus or a bad smell. Activity  Rest as told by your health care provider.  Avoid sitting for a long time without moving. Get up to take short walks every 1-2 hours. This is important to improve blood flow and breathing. Ask for help if you feel weak or unsteady.  Return to your normal activities as told by your health care provider. Ask your health care provider what activities are safe for you.  Do not  drive until your health care provider says that it is safe.  Do not lift anything that is heavier than 10 lb (4.5 kg), or the limit that you are told, until your health care provider says that it is safe. This may be 2-6 weeks depending on your surgery.  Until your health care provider approves: ? Do not douche. ? Do not use tampons. ? Do not have sex. Medicines  Take over-the-counter and prescription medicines only as told by your health care provider.  Ask your health care provider if the medicine prescribed to you: ? Requires you to avoid driving or using heavy machinery. ? Can cause constipation. You may need to take actions to prevent or treat constipation,  such as:  Drink enough fluid to keep your urine pale yellow.  Take over-the-counter or prescription medicines.  Eat foods that are high in fiber, such as beans, whole grains, and fresh fruits and vegetables.  Limit foods that are high in fat and processed sugars, such as fried or sweet foods. General instructions  Wear compression stockings as told by your health care provider. These stockings help to prevent blood clots and reduce swelling in your legs.  Do not use any products that contain nicotine or tobacco, such as cigarettes, e-cigarettes, and chewing tobacco. If you need help quitting, ask your health care provider.  Do not take baths, swim, or use a hot tub until your health care provider approves. You may take showers.  Keep all follow-up visits as told by your health care provider. This is important. Contact a health care provider if you have:  Pain when you urinate.  Redness, swelling, or pain around an incision.  Fluid or blood coming from an incision.  Pus or a bad smell coming from an incision.  An incision that feels warm to the touch.  A fever.  Abdominal pain that gets worse or does not get better with medicine.  An incision that starts to break open.  A rash.  Light-headedness.  Nausea and vomiting. Get help right away if you:  Have pain in your chest or leg.  Develop shortness of breath.  Faint.  Have increased or heavy vaginal bleeding, such as soaking a pad in an hour. Summary  After the procedure, it is common to feel tired, have some pain in your abdomen, and have some light vaginal bleeding for a few days.  Follow instructions from your health care provider about how to take care of your incisions.  Return to your normal activities as told by your health care provider. Ask your health care provider what activities are safe for you.  Do not douche, use tampons, or have sex until your health care provider approves.  Keep all follow-up  visits as told by your health care provider. This information is not intended to replace advice given to you by your health care provider. Make sure you discuss any questions you have with your health care provider. Document Revised: 03/20/2018 Document Reviewed: 03/20/2018 Elsevier Patient Education  2020 [employer]  Abdominal Hysterectomy, Care After This sheet gives you information about how to care for yourself after your procedure. Your doctor may also give you more specific instructions. If you have problems or questions, contact your doctor. Follow these instructions at home: Bathing  Do not take baths, swim, or use a hot tub until your doctor says it is okay. Ask your doctor if you can take showers. You may only be allowed to take sponge baths for  bathing.  Keep the bandage (dressing) dry until your doctor says it can be taken off. Surgical cut (incision) care      Follow instructions from your doctor about how to take care of your cut from surgery. Make sure you: ? Wash your hands with soap and water before you change your bandage (dressing). If you cannot use soap and water, use hand sanitizer. ? Change your bandage as told by your doctor. ? Leave stitches (sutures), skin glue, or skin tape (adhesive) strips in place. They may need to stay in place for 2 weeks or longer. If tape strips get loose and curl up, you may trim the loose edges. Do not remove tape strips completely unless your doctor says it is okay.  Check your surgical cut area every day for signs of infection. Check for: ? Redness, swelling, or pain. ? Fluid or blood. ? Warmth. ? Pus or a bad smell. Activity  Do gentle, daily exercise as told by your doctor. You may be told to take short walks every day and go farther each time.  Do not lift anything that is heavier than 10 lb (4.5 kg), or the limit that your doctor tells you, until he or she says that it is safe.  Do not drive or use heavy machinery while  taking prescription pain medicine.  Do not drive for 24 hours if you were given a medicine to help you relax (sedative).  Follow your doctor's advice about exercise, driving, and general activities. Ask your doctor what activities are safe for you. Lifestyle   Do not douche, use tampons, or have sex for at least 6 weeks or as told by your doctor.  Do not drink alcohol until your doctor says it is okay.  Drink enough fluid to keep your pee (urine) clear or pale yellow.  Try to have someone at home with you for the first 1-2 weeks to help.  Do not use any products that contain nicotine or tobacco, such as cigarettes and e-cigarettes. These can slow down healing. If you need help quitting, ask your doctor. General instructions  Take over-the-counter and prescription medicines only as told by your doctor.  Do not take aspirin or ibuprofen. These medicines can cause bleeding.  To prevent or treat constipation while you are taking prescription pain medicine, your doctor may suggest that you: ? Drink enough fluid to keep your urine clear or pale yellow. ? Take over-the-counter or prescription medicines. ? Eat foods that are high in fiber, such as:  Fresh fruits and vegetables.  Whole grains.  Beans. ? Limit foods that are high in fat and processed sugars, such as fried and sweet foods.  Keep all follow-up visits as told by your doctor. This is important. Contact a doctor if:  You have chills or fever.  You have redness, swelling, or pain around your cut.  You have fluid or blood coming from your cut.  Your cut feels warm to the touch.  You have pus or a bad smell coming from your cut.  Your cut breaks open.  You feel dizzy or light-headed.  You have pain or bleeding when you pee.  You keep having watery poop (diarrhea).  You keep feeling sick to your stomach (nauseous) or keep throwing up (vomiting).  You have unusual fluid (discharge) coming from your  vagina.  You have a rash.  You have a reaction to your medicine.  Your pain medicine does not help. Get help right away if:  You have a fever and your symptoms get worse all of a sudden.  You have very bad belly (abdominal) pain.  You are short of breath.  You pass out (faint).  You have pain, swelling, or redness of your leg.  You bleed a lot from your vagina and notice clumps of blood (clots). Summary  Do not take baths, swim, or use a hot tub until your doctor says it is okay. Ask your doctor if you can take showers. You may only be allowed to take sponge baths for bathing.  Follow your doctor's advice about exercise, driving, and general activities. Ask your doctor what activities are safe for you.  Do not lift anything that is heavier than 10 lb (4.5 kg), or the limit that your doctor tells you, until he or she says that it is safe.  Try to have someone at home with you for the first 1-2 weeks to help. This information is not intended to replace advice given to you by your health care provider. Make sure you discuss any questions you have with your health care provider. Document Revised: 06/01/2018 Document Reviewed: 03/17/2016 Elsevier Patient Education  2020 [employer] Inc.  General Anesthesia, Adult, Care After This sheet gives you information about how to care for yourself after your procedure. Your health care provider may also give you more specific instructions. If you have problems or questions, contact your health care provider. What can I expect after the procedure? After the procedure, the following side effects are common:  Pain or discomfort at the IV site.  Nausea.  Vomiting.  Sore throat.  Trouble concentrating.  Feeling cold or chills.  Weak or tired.  Sleepiness and fatigue.  Soreness and body aches. These side effects can affect parts of the body that were not involved in surgery. Follow these instructions at home:  For at least 24 hours  after the procedure:  Have a responsible adult stay with you. It is important to have someone help care for you until you are awake and alert.  Rest as needed.  Do not: ? Participate in activities in which you could fall or become injured. ? Drive. ? Use heavy machinery. ? Drink alcohol. ? Take sleeping pills or medicines that cause drowsiness. ? Make important decisions or sign legal documents. ? Take care of children on your own. Eating and drinking  Follow any instructions from your health care provider about eating or drinking restrictions.  When you feel hungry, start by eating small amounts of foods that are soft and easy to digest (bland), such as toast. Gradually return to your regular diet.  Drink enough fluid to keep your urine pale yellow.  If you vomit, rehydrate by drinking water, juice, or clear broth. General instructions  If you have sleep apnea, surgery and certain medicines can increase your risk for breathing problems. Follow instructions from your health care provider about wearing your sleep device: ? Anytime you are sleeping, including during daytime naps. ? While taking prescription pain medicines, sleeping medicines, or medicines that make you drowsy.  Return to your normal activities as told by your health care provider. Ask your health care provider what activities are safe for you.  Take over-the-counter and prescription medicines only as told by your health care provider.  If you smoke, do not smoke without supervision.  Keep all follow-up visits as told by your health care provider. This is important. Contact a health care provider if:  You have nausea or vomiting that  does not get better with medicine.  You cannot eat or drink without vomiting.  You have pain that does not get better with medicine.  You are unable to pass urine.  You develop a skin rash.  You have a fever.  You have redness around your IV site that gets worse. Get help  right away if:  You have difficulty breathing.  You have chest pain.  You have blood in your urine or stool, or you vomit blood. Summary  After the procedure, it is common to have a sore throat or nausea. It is also common to feel tired.  Have a responsible adult stay with you for the first 24 hours after general anesthesia. It is important to have someone help care for you until you are awake and alert.  When you feel hungry, start by eating small amounts of foods that are soft and easy to digest (bland), such as toast. Gradually return to your regular diet.  Drink enough fluid to keep your urine pale yellow.  Return to your normal activities as told by your health care provider. Ask your health care provider what activities are safe for you. This information is not intended to replace advice given to you by your health care provider. Make sure you discuss any questions you have with your health care provider. Document Revised: 04/01/2017 Document Reviewed: 11/12/2016 Elsevier Patient Education  2020 [employer] How to Use Chlorhexidine for Bathing Chlorhexidine gluconate (CHG) is a germ-killing (antiseptic) solution that is used to clean the skin. It can get rid of the bacteria that normally live on the skin and can keep them away for about 24 hours. To clean your skin with CHG, you may be given:  A CHG solution to use in the shower or as part of a sponge bath.  A prepackaged cloth that contains CHG. Cleaning your skin with CHG may help lower the risk for infection:  While you are staying in the intensive care unit of the hospital.  If you have a vascular access, such as a central line, to provide short-term or long-term access to your veins.  If you have a catheter to drain urine from your bladder.  If you are on a ventilator. A ventilator is a machine that helps you breathe by moving air in and out of your lungs.  After surgery. What are the risks? Risks of using CHG  include:  A skin reaction.  Hearing loss, if CHG gets in your ears.  Eye injury, if CHG gets in your eyes and is not rinsed out.  The CHG product catching fire. Make sure that you avoid smoking and flames after applying CHG to your skin. Do not use CHG:  If you have a chlorhexidine allergy or have previously reacted to chlorhexidine.  On babies younger than 362 months of age. How to use CHG solution  Use CHG only as told by your health care provider, and follow the instructions on the label.  Use the full amount of CHG as directed. Usually, this is one bottle. During a shower Follow these steps when using CHG solution during a shower (unless your health care provider gives you different instructions): 1. Start the shower. 2. Use your normal soap and shampoo to wash your face and hair. 3. Turn off the shower or move out of the shower stream. 4. Pour the CHG onto a clean washcloth. Do not use any type of brush or rough-edged sponge. 5. Starting at your neck, lather your  body down to your toes. Make sure you follow these instructions: ? If you will be having surgery, pay special attention to the part of your body where you will be having surgery. Scrub this area for at least 1 minute. ? Do not use CHG on your head or face. If the solution gets into your ears or eyes, rinse them well with water. ? Avoid your genital area. ? Avoid any areas of skin that have broken skin, cuts, or scrapes. ? Scrub your back and under your arms. Make sure to wash skin folds. 6. Let the lather sit on your skin for 1-2 minutes or as long as told by your health care provider. 7. Thoroughly rinse your entire body in the shower. Make sure that all body creases and crevices are rinsed well. 8. Dry off with a clean towel. Do not put any substances on your body afterward--such as powder, lotion, or perfume--unless you are told to do so by your health care provider. Only use lotions that are recommended by the  manufacturer. 9. Put on clean clothes or pajamas. 10. If it is the night before your surgery, sleep in clean sheets.  During a sponge bath Follow these steps when using CHG solution during a sponge bath (unless your health care provider gives you different instructions): 1. Use your normal soap and shampoo to wash your face and hair. 2. Pour the CHG onto a clean washcloth. 3. Starting at your neck, lather your body down to your toes. Make sure you follow these instructions: ? If you will be having surgery, pay special attention to the part of your body where you will be having surgery. Scrub this area for at least 1 minute. ? Do not use CHG on your head or face. If the solution gets into your ears or eyes, rinse them well with water. ? Avoid your genital area. ? Avoid any areas of skin that have broken skin, cuts, or scrapes. ? Scrub your back and under your arms. Make sure to wash skin folds. 4. Let the lather sit on your skin for 1-2 minutes or as long as told by your health care provider. 5. Using a different clean, wet washcloth, thoroughly rinse your entire body. Make sure that all body creases and crevices are rinsed well. 6. Dry off with a clean towel. Do not put any substances on your body afterward--such as powder, lotion, or perfume--unless you are told to do so by your health care provider. Only use lotions that are recommended by the manufacturer. 7. Put on clean clothes or pajamas. 8. If it is the night before your surgery, sleep in clean sheets. How to use CHG prepackaged cloths  Only use CHG cloths as told by your health care provider, and follow the instructions on the label.  Use the CHG cloth on clean, dry skin.  Do not use the CHG cloth on your head or face unless your health care provider tells you to.  When washing with the CHG cloth: ? Avoid your genital area. ? Avoid any areas of skin that have broken skin, cuts, or scrapes. Before surgery Follow these steps when  using a CHG cloth to clean before surgery (unless your health care provider gives you different instructions): 1. Using the CHG cloth, vigorously scrub the part of your body where you will be having surgery. Scrub using a back-and-forth motion for 3 minutes. The area on your body should be completely wet with CHG when you are done scrubbing.  2. Do not rinse. Discard the cloth and let the area air-dry. Do not put any substances on the area afterward, such as powder, lotion, or perfume. 3. Put on clean clothes or pajamas. 4. If it is the night before your surgery, sleep in clean sheets.  For general bathing Follow these steps when using CHG cloths for general bathing (unless your health care provider gives you different instructions). 1. Use a separate CHG cloth for each area of your body. Make sure you wash between any folds of skin and between your fingers and toes. Wash your body in the following order, switching to a new cloth after each step: ? The front of your neck, shoulders, and chest. ? Both of your arms, under your arms, and your hands. ? Your stomach and groin area, avoiding the genitals. ? Your right leg and foot. ? Your left leg and foot. ? The back of your neck, your back, and your buttocks. 2. Do not rinse. Discard the cloth and let the area air-dry. Do not put any substances on your body afterward--such as powder, lotion, or perfume--unless you are told to do so by your health care provider. Only use lotions that are recommended by the manufacturer. 3. Put on clean clothes or pajamas. Contact a health care provider if:  Your skin gets irritated after scrubbing.  You have questions about using your solution or cloth. Get help right away if:  Your eyes become very red or swollen.  Your eyes itch badly.  Your skin itches badly and is red or swollen.  Your hearing changes.  You have trouble seeing.  You have swelling or tingling in your mouth or throat.  You have  trouble breathing.  You swallow any chlorhexidine. Summary  Chlorhexidine gluconate (CHG) is a germ-killing (antiseptic) solution that is used to clean the skin. Cleaning your skin with CHG may help to lower your risk for infection.  You may be given CHG to use for bathing. It may be in a bottle or in a prepackaged cloth to use on your skin. Carefully follow your health care provider's instructions and the instructions on the product label.  Do not use CHG if you have a chlorhexidine allergy.  Contact your health care provider if your skin gets irritated after scrubbing. This information is not intended to replace advice given to you by your health care provider. Make sure you discuss any questions you have with your health care provider. Document Revised: 06/15/2018 Document Reviewed: 02/24/2017 Elsevier Patient Education  2020 [employer].

## 2019-09-27 ENCOUNTER — Encounter (HOSPITAL_COMMUNITY): Payer: Self-pay

## 2019-09-27 ENCOUNTER — Other Ambulatory Visit: Payer: Self-pay

## 2019-09-27 ENCOUNTER — Other Ambulatory Visit: Payer: Self-pay | Admitting: Obstetrics & Gynecology

## 2019-09-27 ENCOUNTER — Encounter (HOSPITAL_COMMUNITY)
Admission: RE | Admit: 2019-09-27 | Discharge: 2019-09-27 | Disposition: A | Discharge status: Home / Self Care | Payer: No Typology Code available for payment source | Source: Ambulatory Visit | Attending: Obstetrics & Gynecology | Admitting: Obstetrics & Gynecology

## 2019-09-27 DIAGNOSIS — Z01812 Encounter for preprocedural laboratory examination: Secondary | ICD-10-CM | POA: Insufficient documentation

## 2019-09-27 HISTORY — DX: Other complications of anesthesia, initial encounter: T88.59XA

## 2019-09-27 LAB — CBC
HCT: 38.2 % (ref 36.0–46.0)
Hemoglobin: 12.6 g/dL (ref 12.0–15.0)
MCH: 29.9 pg (ref 26.0–34.0)
MCHC: 33 g/dL (ref 30.0–36.0)
MCV: 90.7 fL (ref 80.0–100.0)
Platelets: 289 10*3/uL (ref 150–400)
RBC: 4.21 MIL/uL (ref 3.87–5.11)
RDW: 12.3 % (ref 11.5–15.5)
WBC: 4.8 10*3/uL (ref 4.0–10.5)
nRBC: 0 % (ref 0.0–0.2)

## 2019-09-27 LAB — RAPID HIV SCREEN (HIV 1/2 AB+AG)
HIV 1/2 Antibodies: NONREACTIVE
HIV-1 P24 Antigen - HIV24: NONREACTIVE

## 2019-09-27 LAB — TYPE AND SCREEN
ABO/RH(D): A POS
Antibody Screen: NEGATIVE

## 2019-09-27 LAB — COMPREHENSIVE METABOLIC PANEL
ALT: 23 U/L (ref 0–44)
AST: 21 U/L (ref 15–41)
Albumin: 4.2 g/dL (ref 3.5–5.0)
Alkaline Phosphatase: 65 U/L (ref 38–126)
Anion gap: 10 (ref 5–15)
BUN: 14 mg/dL (ref 6–20)
CO2: 23 mmol/L (ref 22–32)
Calcium: 9.1 mg/dL (ref 8.9–10.3)
Chloride: 105 mmol/L (ref 98–111)
Creatinine, Ser: 0.85 mg/dL (ref 0.44–1.00)
GFR calc Af Amer: >60 mL/min (ref >60)
GFR calc non Af Amer: >60 mL/min (ref >60)
Glucose, Bld: 93 mg/dL (ref 70–99)
Potassium: 4 mmol/L (ref 3.5–5.1)
Sodium: 138 mmol/L (ref 135–145)
Total Bilirubin: 0.4 mg/dL (ref 0.3–1.2)
Total Protein: 7.6 g/dL (ref 6.5–8.1)

## 2019-09-27 LAB — URINALYSIS, ROUTINE W REFLEX MICROSCOPIC
Bacteria, UA: NONE SEEN
Bilirubin Urine: NEGATIVE
Glucose, UA: NEGATIVE mg/dL
Ketones, ur: NEGATIVE mg/dL
Leukocytes,Ua: NEGATIVE
Nitrite: NEGATIVE
Protein, ur: NEGATIVE mg/dL
Specific Gravity, Urine: 1.01 (ref 1.005–1.030)
pH: 7 (ref 5.0–8.0)

## 2019-09-27 LAB — HCG, QUANTITATIVE, PREGNANCY: hCG, Beta Chain, Quant, S: <1 m[IU]/mL (ref <5)

## 2019-09-27 MED FILL — buPROPion HCL ER (XL) 150 M: 150 | 30 days supply | Qty: 30 | Fill #1

## 2019-10-01 ENCOUNTER — Other Ambulatory Visit: Payer: Self-pay

## 2019-10-01 ENCOUNTER — Other Ambulatory Visit (HOSPITAL_COMMUNITY)
Admission: RE | Admit: 2019-10-01 | Discharge: 2019-10-01 | Disposition: A | Discharge status: Home / Self Care | Payer: No Typology Code available for payment source | Source: Ambulatory Visit | Attending: Obstetrics & Gynecology | Admitting: Obstetrics & Gynecology

## 2019-10-01 DIAGNOSIS — Z029 Encounter for administrative examinations, unspecified: Secondary | ICD-10-CM

## 2019-10-02 LAB — SARS CORONAVIRUS 2 (TAT 6-24 HRS): SARS Coronavirus 2: NEGATIVE

## 2019-10-02 NOTE — H&P (Signed)
Preoperative History and Physical  Anna Willis is a 39 y.o. G1P0010 with No LMP recorded. (Menstrual status: Other). admitted for a total abdominal hysterectomy with bilateral salpingectomy, using a mini laparotomy incision The issues this patient has had dates back many years I will summarize the most recent 18 months that has brought Anna Willis to the point of definitive surgical management The patient was seen in the emergency department in December 2019 with a complaint of almost continuous bleeding She would essentially bleed at that time every 11 days with clots and severe cramping associated The emergency department contacted me at that point and I placed her on megestrol therapy in the hopes of complete endometrial suppression I then saw her in the office on April 28, 2018 and continued her on megestrol continuously for the ensuing 3 months Her hemoglobin at that time was 11.5 The sonogram she had performed through her emergency department visit in December 2019 was essentially normal and noncontributory. By April 2020 the patient had been made amenorrhea on the chronic Megace therapy and wanted to continue that as she was doing so much better Unfortunately the patient experienced approximately a 25 pound weight gain by August 2020 and while she was for the most part amenorrheic she also had breakthrough bleeding A conversation with her primary care provided prompted her to seek conversation versus placement of an intrauterine device which she ultimately declined She decided to go off the megestrol and try Lysteda and by October 2020 she was back in the emergency department with uncontrolled vaginal bleeding once again At that time she was given medroxyprogesterone 300 mg IM to shut down her bleeding acutely and megestrol for ongoing management By December 2020 during the visit in my office we started discussing definitive therapy as we were unable to control her bleeding with anything  other than megestrol but she was having ongoing problems with weight gain She was also having more problems with emotional lability as a result of the megestrol She was also subsequently developing dyspareunia 100% of the time which may or may not be related to megestrol therapy my impression not related Due to life circumstances she was inclined to delay any surgical management till later in 2021 and she remained on her megestrol  During today's visit Kareem's wife Anna Willis was on the call for the entire time during the visit The patient actually and I spent greater than 30 minutes discussing the surgery and what is likely to improve and maybe not improve Because of her 100% dyspareunia which on exam is related to cervical manipulation or bump dyspareunia it certainly is indicated for a complete uterine and cervix removal to be performed She is not having chronic adnexal pain Her tubes will be removed for ovarian cancer prophylaxis indication Anna Willis also has some episodic random out of the blue sharp abdominal pain which I do not believe is related to her GYN issues She does have similar pain after intercourse but it is also somewhat random My suspicion is this is gastroenterology related and it seems to be bowel spasm especially as it is related to breaking out in a cold sweat and feeling nauseated at the same time In any event we discussed the likelihood of this not improving with her surgery but certainly because of her ongoing menometrorrhagia and dysfunctional uterine bleeding while on anything other than megestrol certainly this will be managed by the hysterectomy Her dyspareunia will also be eliminated because were doing a complete uterine and cervix removal  It certainly possible that she could have endometriosis and she understands that we will be looking for that intraoperatively A repeat sonogram was performed in April in our office and again did not show any abnormal pelvic  anatomical findings  The patient and her wife understand the indications for procedure the alternatives which we have exhausted and the complications that may arise as a result of surgery including postoperative infection, excessive bleeding, anesthesia related complications as well as thromboembolic events.  We will certainly take perioperative steps to minimize all of these risk.  All questions were answered and the decision was made to go forward later this month.   PMH:        Past Medical History:  Diagnosis Date  . Acid reflux   . Anxiety   . Migraine   . Neurofibromatosis (HCC)   . Scoliosis   . Seasonal allergies   . Tachycardia     PSH:          Past Surgical History:  Procedure Laterality Date  . DILATION AND CURETTAGE OF UTERUS    . LAPAROSCOPIC APPENDECTOMY N/A 07/18/2019   Procedure: APPENDECTOMY LAPAROSCOPIC;  Surgeon: Lucretia Roers, MD;  Location: [facility];  Service: General;  Laterality: N/A;  . TOE AMPUTATION     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   . WISDOM TOOTH EXTRACTION      POb/GynH:              OB History    Gravida  1   Para      Term      Preterm      AB  1   Living        SAB  1   TAB      Ectopic      Multiple      Live Births              SH:   Social History        Tobacco Use  . Smoking status: Former Smoker    Years: 10.00    Types: Cigarettes  . Smokeless tobacco: Never Used  Vaping Use  . Vaping Use: Never used  Substance Use Topics  . Alcohol use: No  . Drug use: No    FH:         Family History  Problem Relation Age of Onset  . Migraines Mother   . Epilepsy Mother   . Endometriosis Mother        had hyst at age 39  . Neurofibromatosis Father   . Dementia Maternal Grandmother   . Kidney disease Maternal Grandmother   . Other Maternal Grandmother        shingles  . Heart attack Maternal Grandfather   . Breast cancer Paternal Aunt   . Breast  cancer Paternal Aunt   . Breast cancer Paternal Aunt      Allergies:       Allergies  Allergen Reactions  . Amoxicillin Nausea And Vomiting    Projectile vomiting  . Rocephin [Ceftriaxone] Nausea And Vomiting  . Succinylcholine Other (See Comments)    Muscle weakness/muscle pain.  . Erythromycin Rash    Medications:       Current Outpatient Medications:  .  Acetaminophen-Caff-Pyrilamine (MIDOL COMPLETE) 500-60-15 MG TABS, Take 1 tablet by mouth daily as needed (pain/cramps). , Disp: , Rfl:  .  ANUCORT-HC 25 MG suppository, Place 1-2 suppositories rectally daily as needed for hemorrhoids. , Disp: ,  Rfl:  .  buPROPion (WELLBUTRIN SR) 150 MG 12 hr tablet, Take 150 mg by mouth daily with supper. , Disp: , Rfl:  .  cyclobenzaprine (FLEXERIL) 10 MG tablet, Take 10 mg by mouth every 8 (eight) hours as needed for muscle spasms (muscle pain.). , Disp: , Rfl:  .  diphenhydrAMINE HCl, Sleep, (ZZZQUIL) 25 MG CAPS, Take 50 capsules by mouth at bedtime. , Disp: , Rfl:  .  docusate sodium (COLACE) 100 MG capsule, Take 1 capsule (100 mg total) by mouth 2 (two) times daily. (Patient taking differently: Take 100 mg by mouth at bedtime. ), Disp: 60 capsule, Rfl: 1 .  esomeprazole (NEXIUM) 20 MG capsule, Take 20 mg by mouth daily before breakfast. , Disp: , Rfl:  .  ibuprofen (ADVIL,MOTRIN) 200 MG tablet, Take 400-600 mg by mouth daily as needed for mild pain or moderate pain. , Disp: , Rfl:  .  JUBLIA 10 % SOLN, Apply 1 application topically 4 (four) times a week. 48 week course starting on 04/18/2019, Disp: , Rfl:  .  megestrol (MEGACE) 40 MG tablet, TAKE 3 TABLETS BY MOUTH DAILY FOR 5 DAYS, 2 DAILY FOR 5 DAYS, THEN 1 DAILY (Patient taking differently: Take 40 mg by mouth daily. ), Disp: 45 tablet, Rfl: 3 .  metoprolol succinate (TOPROL-XL) 25 MG 24 hr tablet, Take 25 mg by mouth daily with supper. , Disp: , Rfl:  .  ondansetron (ZOFRAN ODT) 4 MG disintegrating tablet, Take 1 tablet (4 mg  total) by mouth every 6 (six) hours as needed for nausea., Disp: 20 tablet, Rfl: 0 .  SUMAtriptan (IMITREX) 100 MG tablet, Take 50 mg by mouth every 2 (two) hours as needed for migraine or headache. , Disp: , Rfl:  .  acetaminophen (TYLENOL) 500 MG tablet, Take 500-1,000 mg by mouth every 6 (six) hours as needed (pain.)., Disp: , Rfl:  .  Erenumab-aooe (AIMOVIG [place]), Inject 1 Dose into the skin every 30 (thirty) days., Disp: , Rfl:  .  methocarbamol (ROBAXIN) 500 MG tablet, Take 1 tablet (500 mg total) by mouth every 8 (eight) hours as needed for muscle spasms. (Patient not taking: Reported on 09/14/2019), Disp: 15 tablet, Rfl: 0 .  polyethylene glycol (MIRALAX / GLYCOLAX) 17 g packet, Take 17 g by mouth daily., Disp: , Rfl:   Review of Systems:   Review of Systems  Constitutional: Negative for fever, chills, weight loss, malaise/fatigue and diaphoresis.  HENT: Negative for hearing loss, ear pain, nosebleeds, congestion, sore throat, neck pain, tinnitus and ear discharge.   Eyes: Negative for blurred vision, double vision, photophobia, pain, discharge and redness.  Respiratory: Negative for cough, hemoptysis, sputum production, shortness of breath, wheezing and stridor.   Cardiovascular: Negative for chest pain, palpitations, orthopnea, claudication, leg swelling and PND.  Gastrointestinal: Positive for abdominal pain. Negative for heartburn, nausea, vomiting, diarrhea, constipation, blood in stool and melena.  Genitourinary: Negative for dysuria, urgency, frequency, hematuria and flank pain.  Musculoskeletal: Negative for myalgias, back pain, joint pain and falls.  Skin: Negative for itching and rash.  Neurological: Negative for dizziness, tingling, tremors, sensory change, speech change, focal weakness, seizures, loss of consciousness, weakness and headaches.  Endo/Heme/Allergies: Negative for environmental allergies and polydipsia. Does not bruise/bleed easily.  Psychiatric/Behavioral:  Negative for depression, suicidal ideas, hallucinations, memory loss and substance abuse. The patient is not nervous/anxious and does not have insomnia.      PHYSICAL EXAM:  Blood pressure 112/80, pulse 84, height 5' (1.524 m), weight 162  lb 8 oz (73.7 kg).    Vitals reviewed. Constitutional: She is oriented to person, place, and time. She appears well-developed and well-nourished.  HENT:  Head: Normocephalic and atraumatic.  Right Ear: External ear normal.  Left Ear: External ear normal.  Nose: Nose normal.  Mouth/Throat: Oropharynx is clear and moist.  Eyes: Conjunctivae and EOM are normal. Pupils are equal, round, and reactive to light. Right eye exhibits no discharge. Left eye exhibits no discharge. No scleral icterus.  Neck: Normal range of motion. Neck supple. No tracheal deviation present. No thyromegaly present.  Cardiovascular: Normal rate, regular rhythm, normal heart sounds and intact distal pulses.  Exam reveals no gallop and no friction rub.   No murmur heard. Respiratory: Effort normal and breath sounds normal. No respiratory distress. She has no wheezes. She has no rales. She exhibits no tenderness.  GI: Soft. Bowel sounds are normal. She exhibits no distension and no mass. There is tenderness. There is no rebound and no guarding.  Genitourinary:       Vulva is normal without lesions Vagina is pink moist without discharge Cervix normal in appearance and pap is normal Uterus is normal size, contour, position, consistency, mobility, non-tender Adnexa is negative with normal sized ovaries by sonogram  Musculoskeletal: Normal range of motion. She exhibits no edema and no tenderness.  Neurological: She is alert and oriented to person, place, and time. She has normal reflexes. She displays normal reflexes. No cranial nerve deficit. She exhibits normal muscle tone. Coordination normal.  Skin: Skin is warm and dry. No rash noted. No erythema. No pallor.  Psychiatric: She  has a normal mood and affect. Her behavior is normal. Judgment and thought content normal.    Labs: Results for orders placed or performed during the hospital encounter of 10/01/19 (from the past 336 hour(s))  SARS CORONAVIRUS 2 (TAT 6-24 HRS) Nasopharyngeal Nasopharyngeal Swab   Collection Time: 10/01/19  7:48 AM   Specimen: Nasopharyngeal Swab  Result Value Ref Range   SARS Coronavirus 2 NEGATIVE NEGATIVE  Results for orders placed or performed during the hospital encounter of 09/27/19 (from the past 336 hour(s))  Urinalysis, Routine w reflex microscopic   Collection Time: 09/27/19 11:39 AM  Result Value Ref Range   Color, Urine YELLOW YELLOW   APPearance CLEAR CLEAR   Specific Gravity, Urine 1.010 1.005 - 1.030   pH 7.0 5.0 - 8.0   Glucose, UA NEGATIVE NEGATIVE mg/dL   Hgb urine dipstick MODERATE (A) NEGATIVE   Bilirubin Urine NEGATIVE NEGATIVE   Ketones, ur NEGATIVE NEGATIVE mg/dL   Protein, ur NEGATIVE NEGATIVE mg/dL   Nitrite NEGATIVE NEGATIVE   Leukocytes,Ua NEGATIVE NEGATIVE   RBC / HPF 0-5 0 - 5 RBC/hpf   WBC, UA 0-5 0 - 5 WBC/hpf   Bacteria, UA NONE SEEN NONE SEEN   Squamous Epithelial / LPF 0-5 0 - 5  CBC   Collection Time: 09/27/19 12:04 PM  Result Value Ref Range   WBC 4.8 4.0 - 10.5 K/uL   RBC 4.21 3.87 - 5.11 MIL/uL   Hemoglobin 12.6 12.0 - 15.0 g/dL   HCT 38.2 36 - 46 %   MCV 90.7 80.0 - 100.0 fL   MCH 29.9 26.0 - 34.0 pg   MCHC 33.0 30.0 - 36.0 g/dL   RDW 12.3 11.5 - 15.5 %   Platelets 289 150 - 400 K/uL   nRBC 0.0 0.0 - 0.2 %  Comprehensive metabolic panel   Collection Time: 09/27/19 12:04 PM  Result  Value Ref Range   Sodium 138 135 - 145 mmol/L   Potassium 4.0 3.5 - 5.1 mmol/L   Chloride 105 98 - 111 mmol/L   CO2 23 22 - 32 mmol/L   Glucose, Bld 93 70 - 99 mg/dL   BUN 14 6 - 20 mg/dL   Creatinine, Ser 6.14 0.44 - 1.00 mg/dL   Calcium 9.1 8.9 - 43.1 mg/dL   Total Protein 7.6 6.5 - 8.1 g/dL   Albumin 4.2 3.5 - 5.0 g/dL   AST 21 15 - 41 U/L    ALT 23 0 - 44 U/L   Alkaline Phosphatase 65 38 - 126 U/L   Total Bilirubin 0.4 0.3 - 1.2 mg/dL   GFR calc non Af Amer >60 >60 mL/min   GFR calc Af Amer >60 >60 mL/min   Anion gap 10 5 - 15  hCG, quantitative, pregnancy   Collection Time: 09/27/19 12:04 PM  Result Value Ref Range   hCG, Beta Chain, Quant, S <1 <5 mIU/mL  Rapid HIV screen (HIV 1/2 Ab+Ag)   Collection Time: 09/27/19 12:04 PM  Result Value Ref Range   HIV-1 P24 Antigen - HIV24 NON REACTIVE NON REACTIVE   HIV 1/2 Antibodies NON REACTIVE NON REACTIVE   Interpretation (HIV Ag Ab)      A non reactive test result means that HIV 1 or HIV 2 antibodies and HIV 1 p24 antigen were not detected in the specimen.  Type and screen   Collection Time: 09/27/19 12:05 PM  Result Value Ref Range   ABO/RH(D) A POS    Antibody Screen NEG    Sample Expiration      10/11/2019,2359 Performed at [facility], [address], [place], [place] [address]      EKG:    Orders placed or performed during the hospital encounter of 07/19/19  . ED EKG  . ED EKG  . EKG 12-Lead  . EKG 12-Lead    Imaging Studies: Narrative & Impression  CLINICAL DATA:  Acute generalized abdominal pain.  EXAM: CT ABDOMEN AND PELVIS WITH CONTRAST  TECHNIQUE: Multidetector CT imaging of the abdomen and pelvis was performed using the standard protocol following bolus administration of intravenous contrast.  CONTRAST:  OMNIPAQUE IOHEXOL 300 MG/ML  SOLN  COMPARISON:  None.  FINDINGS: Lower chest: No acute abnormality.  Hepatobiliary: No focal liver abnormality is seen. No gallstones, gallbladder wall thickening, or biliary dilatation.  Pancreas: Unremarkable. No pancreatic ductal dilatation or surrounding inflammatory changes.  Spleen: Normal in size without focal abnormality.  Adrenals/Urinary Tract: Adrenal glands appear normal. Small nonobstructive right renal calculus is noted. No hydronephrosis or renal obstruction is noted.  Urinary bladder is unremarkable.  Stomach/Bowel: The stomach appears normal. There is no evidence of bowel obstruction. However, the appendix is enlarged measuring 14 mm in maximum diameter. No definite surrounding inflammation is noted, but appendicitis cannot be excluded.  Vascular/Lymphatic: No significant vascular findings are present. No enlarged abdominal or pelvic lymph nodes.  Reproductive: Uterus and bilateral adnexa are unremarkable.  Other: No abdominal wall hernia or abnormality. No abdominopelvic ascites.  Musculoskeletal: No acute or significant osseous findings.  IMPRESSION: 1. Enlarged appendix is noted measuring 14 mm in maximum diameter, but no definite surrounding inflammation is noted. This is concerning for acute appendicitis in the appropriate clinical setting. 2. Small nonobstructive right renal calculus.   Electronically Signed   By: Lupita Raider M.D.   On: 07/17/2019 20:15       Assessment: Menometrorrhagia/DUB Dysmenorrhea  Dyspareunia, deep Weight gain on megestrol therapy     Patient Active Problem List   Diagnosis Date Noted  . Acute appendicitis   . Abdominal pain 07/17/2019  . Neurofibromatosis syndrome (HCC) 01/24/2019  . Obesity with body mass index 30 or greater 01/24/2019  . Menorrhagia 11/13/2018  . Dysmenorrhea 03/24/2018    Plan: TAH BS thru mini lap incision  Pt understands the risks of surgery including but not limited t  excessive bleeding requiring transfusion or reoperation, post-operative infection requiring prolonged hospitalization or re-hospitalization and antibiotic therapy, and damage to other organs including bladder, bowel, ureters and major vessels.  The patient also understands the alternative treatment options which were discussed in full.  All questions were answered.  [name], MD 10/02/2019 2:35 PM

## 2019-10-03 ENCOUNTER — Other Ambulatory Visit: Payer: Self-pay

## 2019-10-03 ENCOUNTER — Inpatient Hospital Stay (HOSPITAL_COMMUNITY): Payer: No Typology Code available for payment source | Admitting: Anesthesiology

## 2019-10-03 ENCOUNTER — Inpatient Hospital Stay (HOSPITAL_COMMUNITY)
Admission: AD | Admit: 2019-10-03 | Discharge: 2019-10-04 | DRG: 743 | Disposition: A | Discharge status: Home / Self Care | Payer: No Typology Code available for payment source | Attending: Obstetrics & Gynecology | Admitting: Obstetrics & Gynecology

## 2019-10-03 ENCOUNTER — Encounter (HOSPITAL_COMMUNITY): Payer: Self-pay | Admitting: Obstetrics & Gynecology

## 2019-10-03 ENCOUNTER — Encounter (HOSPITAL_COMMUNITY)
Admission: AD | Disposition: A | Discharge status: Home / Self Care | Payer: Self-pay | Source: Home / Self Care | Attending: Obstetrics & Gynecology

## 2019-10-03 DIAGNOSIS — N921 Excessive and frequent menstruation with irregular cycle: Secondary | ICD-10-CM | POA: Diagnosis not present

## 2019-10-03 DIAGNOSIS — N938 Other specified abnormal uterine and vaginal bleeding: Secondary | ICD-10-CM | POA: Diagnosis present

## 2019-10-03 DIAGNOSIS — Z90710 Acquired absence of both cervix and uterus: Secondary | ICD-10-CM | POA: Diagnosis present

## 2019-10-03 DIAGNOSIS — Z8249 Family history of ischemic heart disease and other diseases of the circulatory system: Secondary | ICD-10-CM

## 2019-10-03 DIAGNOSIS — Z87891 Personal history of nicotine dependence: Secondary | ICD-10-CM

## 2019-10-03 DIAGNOSIS — N941 Unspecified dyspareunia: Secondary | ICD-10-CM | POA: Diagnosis not present

## 2019-10-03 DIAGNOSIS — Z791 Long term (current) use of non-steroidal anti-inflammatories (NSAID): Secondary | ICD-10-CM

## 2019-10-03 DIAGNOSIS — Z20822 Contact with and (suspected) exposure to covid-19: Secondary | ICD-10-CM | POA: Diagnosis present

## 2019-10-03 DIAGNOSIS — Z820 Family history of epilepsy and other diseases of the nervous system: Secondary | ICD-10-CM

## 2019-10-03 DIAGNOSIS — Q8500 Neurofibromatosis, unspecified: Secondary | ICD-10-CM

## 2019-10-03 DIAGNOSIS — M419 Scoliosis, unspecified: Secondary | ICD-10-CM | POA: Diagnosis present

## 2019-10-03 DIAGNOSIS — Z79899 Other long term (current) drug therapy: Secondary | ICD-10-CM

## 2019-10-03 DIAGNOSIS — Z803 Family history of malignant neoplasm of breast: Secondary | ICD-10-CM

## 2019-10-03 DIAGNOSIS — N946 Dysmenorrhea, unspecified: Secondary | ICD-10-CM | POA: Diagnosis not present

## 2019-10-03 DIAGNOSIS — N200 Calculus of kidney: Secondary | ICD-10-CM | POA: Diagnosis present

## 2019-10-03 HISTORY — PX: ABDOMINAL HYSTERECTOMY: SHX81

## 2019-10-03 HISTORY — PX: BILATERAL SALPINGECTOMY: SHX5743

## 2019-10-03 SURGERY — HYSTERECTOMY, ABDOMINAL
Anesthesia: General

## 2019-10-03 MED ORDER — BUPIVACAINE LIPOSOME 1.3 % IJ SUSP
20.0000 mL | Freq: Once | INTRAMUSCULAR | Status: DC
  Filled 2019-10-03: qty 20

## 2019-10-03 MED ORDER — GABAPENTIN 100 MG PO CAPS
100.0000 mg | ORAL_CAPSULE | Freq: Three times a day (TID) | ORAL | Status: DC
  Administered 2019-10-03 – 2019-10-04 (×2): 100 mg via ORAL
  Filled 2019-10-03 (×8): qty 1

## 2019-10-03 MED ORDER — ONDANSETRON HCL 4 MG PO TABS
8.0000 mg | ORAL_TABLET | Freq: Four times a day (QID) | ORAL | Status: DC | PRN
  Administered 2019-10-04: 8 mg via ORAL
  Filled 2019-10-03: qty 1
  Filled 2019-10-03: qty 2

## 2019-10-03 MED ORDER — HYDROMORPHONE HCL 1 MG/ML IJ SOLN
INTRAMUSCULAR | Status: DC | PRN
  Administered 2019-10-03 (×2): .5 mg via INTRAVENOUS

## 2019-10-03 MED ORDER — DOCUSATE SODIUM 100 MG PO CAPS
100.0000 mg | ORAL_CAPSULE | Freq: Two times a day (BID) | ORAL | Status: DC
  Administered 2019-10-03 – 2019-10-04 (×2): 100 mg via ORAL
  Filled 2019-10-03 (×6): qty 1

## 2019-10-03 MED ORDER — SCOPOLAMINE 1 MG/3DAYS TD PT72
MEDICATED_PATCH | TRANSDERMAL | Status: AC
  Filled 2019-10-03: qty 1

## 2019-10-03 MED ORDER — KCL IN DEXTROSE-NACL 20-5-0.45 MEQ/L-%-% IV SOLN
INTRAVENOUS | Status: AC
  Filled 2019-10-03: qty 1000

## 2019-10-03 MED ORDER — PROPOFOL 10 MG/ML IV BOLUS
INTRAVENOUS | Status: DC | PRN
  Administered 2019-10-03: 200 mg via INTRAVENOUS

## 2019-10-03 MED ORDER — ACETAMINOPHEN 10 MG/ML IV SOLN
1000.0000 mg | Freq: Once | INTRAVENOUS | Status: AC
  Administered 2019-10-03: 1000 mg via INTRAVENOUS
  Filled 2019-10-03: qty 100

## 2019-10-03 MED ORDER — SODIUM CHLORIDE 0.9 % IV SOLN
8.0000 mg | Freq: Four times a day (QID) | INTRAVENOUS | Status: DC | PRN
  Filled 2019-10-03: qty 4

## 2019-10-03 MED ORDER — ALUM & MAG HYDROXIDE-SIMETH 200-200-20 MG/5ML PO SUSP
30.0000 mL | ORAL | Status: DC | PRN
  Filled 2019-10-03: qty 30

## 2019-10-03 MED ORDER — LIDOCAINE HCL (CARDIAC) PF 100 MG/5ML IV SOSY
PREFILLED_SYRINGE | INTRAVENOUS | Status: DC | PRN
  Administered 2019-10-03: 100 mg via INTRAVENOUS

## 2019-10-03 MED ORDER — DIPHENHYDRAMINE HCL 50 MG/ML IJ SOLN: 25.0000 mg | Freq: Four times a day (QID) | INTRAMUSCULAR | Status: DC | PRN

## 2019-10-03 MED ORDER — MIDAZOLAM HCL 2 MG/2ML IJ SOLN
INTRAMUSCULAR | Status: AC
  Filled 2019-10-03: qty 2

## 2019-10-03 MED ORDER — FENTANYL CITRATE (PF) 100 MCG/2ML IJ SOLN
INTRAMUSCULAR | Status: DC | PRN
  Administered 2019-10-03: 50 ug via INTRAVENOUS
  Administered 2019-10-03: 150 ug via INTRAVENOUS
  Administered 2019-10-03 (×3): 50 ug via INTRAVENOUS

## 2019-10-03 MED ORDER — MEPERIDINE HCL 50 MG/ML IJ SOLN: 6.2500 mg | INTRAMUSCULAR | Status: DC | PRN

## 2019-10-03 MED ORDER — BISACODYL 10 MG RE SUPP
10.0000 mg | Freq: Every day | RECTAL | Status: DC | PRN
  Filled 2019-10-03: qty 1

## 2019-10-03 MED ORDER — KETOROLAC TROMETHAMINE 30 MG/ML IJ SOLN
INTRAMUSCULAR | Status: AC
  Filled 2019-10-03: qty 1

## 2019-10-03 MED ORDER — HYDROMORPHONE HCL 1 MG/ML IJ SOLN
INTRAMUSCULAR | Status: AC
  Filled 2019-10-03: qty 1

## 2019-10-03 MED ORDER — HYDROMORPHONE HCL 1 MG/ML IJ SOLN
0.2500 mg | INTRAMUSCULAR | Status: DC | PRN
  Administered 2019-10-03 (×4): 0.5 mg via INTRAVENOUS
  Filled 2019-10-03 (×4): qty 0.5

## 2019-10-03 MED ORDER — IBUPROFEN 800 MG PO TABS: 800.0000 mg | ORAL_TABLET | Freq: Four times a day (QID) | ORAL | Status: DC

## 2019-10-03 MED ORDER — DEXAMETHASONE SODIUM PHOSPHATE 10 MG/ML IJ SOLN
INTRAMUSCULAR | Status: AC
  Filled 2019-10-03: qty 1

## 2019-10-03 MED ORDER — METOPROLOL SUCCINATE ER 25 MG PO TB24
25.0000 mg | ORAL_TABLET | Freq: Every day | ORAL | Status: DC
  Administered 2019-10-03: 25 mg via ORAL
  Filled 2019-10-03 (×3): qty 1

## 2019-10-03 MED ORDER — SCOPOLAMINE 1 MG/3DAYS TD PT72
1.0000 | MEDICATED_PATCH | Freq: Once | TRANSDERMAL | Status: DC
  Administered 2019-10-03: 1.5 mg via TRANSDERMAL

## 2019-10-03 MED ORDER — CHLORHEXIDINE GLUCONATE 0.12 % MT SOLN
15.0000 mL | Freq: Once | OROMUCOSAL | Status: AC
  Administered 2019-10-03: 15 mL via OROMUCOSAL

## 2019-10-03 MED ORDER — ONDANSETRON HCL 4 MG/2ML IJ SOLN
INTRAMUSCULAR | Status: AC
  Filled 2019-10-03: qty 2

## 2019-10-03 MED ORDER — SODIUM CHLORIDE FLUSH 0.9 % IV SOLN
INTRAVENOUS | Status: AC
  Filled 2019-10-03: qty 10

## 2019-10-03 MED ORDER — GENTAMICIN SULFATE 40 MG/ML IJ SOLN
5.0000 mg/kg | INTRAVENOUS | Status: AC
  Administered 2019-10-03: 281.6 mg via INTRAVENOUS
  Filled 2019-10-03: qty 7

## 2019-10-03 MED ORDER — OXYCODONE HCL 5 MG PO TABS
5.0000 mg | ORAL_TABLET | ORAL | Status: DC | PRN
  Administered 2019-10-04 (×2): 10 mg via ORAL
  Filled 2019-10-03 (×2): qty 2

## 2019-10-03 MED ORDER — PANTOPRAZOLE SODIUM 40 MG PO TBEC
40.0000 mg | DELAYED_RELEASE_TABLET | Freq: Every day | ORAL | Status: DC
  Administered 2019-10-03 – 2019-10-04 (×2): 40 mg via ORAL
  Filled 2019-10-03 (×5): qty 1

## 2019-10-03 MED ORDER — SODIUM CHLORIDE 0.9 % IV SOLN
INTRAVENOUS | Status: DC | PRN
  Administered 2019-10-03: 40 mL

## 2019-10-03 MED ORDER — SENNOSIDES-DOCUSATE SODIUM 8.6-50 MG PO TABS
1.0000 | ORAL_TABLET | Freq: Every evening | ORAL | Status: DC | PRN
  Filled 2019-10-03: qty 1

## 2019-10-03 MED ORDER — MIDAZOLAM HCL 2 MG/2ML IJ SOLN
2.0000 mg | Freq: Once | INTRAMUSCULAR | Status: AC
  Administered 2019-10-03: 2 mg via INTRAVENOUS

## 2019-10-03 MED ORDER — BUPIVACAINE LIPOSOME 1.3 % IJ SUSP
INTRAMUSCULAR | Status: AC
  Filled 2019-10-03: qty 10

## 2019-10-03 MED ORDER — LACTATED RINGERS IV SOLN: Freq: Once | INTRAVENOUS | Status: AC

## 2019-10-03 MED ORDER — KETOROLAC TROMETHAMINE 30 MG/ML IJ SOLN
30.0000 mg | Freq: Once | INTRAMUSCULAR | Status: AC
  Administered 2019-10-03: 30 mg via INTRAVENOUS

## 2019-10-03 MED ORDER — ONDANSETRON HCL 4 MG/2ML IJ SOLN
INTRAMUSCULAR | Status: DC | PRN
  Administered 2019-10-03: 4 mg via INTRAVENOUS

## 2019-10-03 MED ORDER — ORAL CARE MOUTH RINSE: 15.0000 mL | Freq: Once | OROMUCOSAL | Status: AC

## 2019-10-03 MED ORDER — SODIUM CHLORIDE 0.9 % IR SOLN
Status: DC | PRN
  Administered 2019-10-03: 1000 mL

## 2019-10-03 MED ORDER — CHLORHEXIDINE GLUCONATE 0.12 % MT SOLN
OROMUCOSAL | Status: AC
  Filled 2019-10-03: qty 15

## 2019-10-03 MED ORDER — SODIUM CHLORIDE (PF) 0.9 % IJ SOLN
INTRAMUSCULAR | Status: AC
  Filled 2019-10-03: qty 20

## 2019-10-03 MED ORDER — ZOLPIDEM TARTRATE 5 MG PO TABS
5.0000 mg | ORAL_TABLET | Freq: Every evening | ORAL | Status: DC | PRN
  Filled 2019-10-03: qty 1

## 2019-10-03 MED ORDER — FENTANYL CITRATE (PF) 250 MCG/5ML IJ SOLN
INTRAMUSCULAR | Status: AC
  Filled 2019-10-03: qty 5

## 2019-10-03 MED ORDER — ROCURONIUM BROMIDE 100 MG/10ML IV SOLN
INTRAVENOUS | Status: DC | PRN
  Administered 2019-10-03: 60 mg via INTRAVENOUS

## 2019-10-03 MED ORDER — MIDAZOLAM HCL 2 MG/2ML IJ SOLN
INTRAMUSCULAR | Status: DC | PRN
  Administered 2019-10-03: 2 mg via INTRAVENOUS

## 2019-10-03 MED ORDER — CLINDAMYCIN PHOSPHATE 900 MG/50ML IV SOLN
INTRAVENOUS | Status: AC
  Filled 2019-10-03: qty 50

## 2019-10-03 MED ORDER — BUPROPION HCL ER (SR) 150 MG PO TB12
150.0000 mg | ORAL_TABLET | Freq: Every day | ORAL | Status: DC
  Administered 2019-10-03: 150 mg via ORAL
  Filled 2019-10-03 (×3): qty 1

## 2019-10-03 MED ORDER — PROMETHAZINE HCL 25 MG/ML IJ SOLN
25.0000 mg | Freq: Four times a day (QID) | INTRAMUSCULAR | Status: DC | PRN
  Administered 2019-10-03: 6.25 mg via INTRAVENOUS
  Administered 2019-10-04 (×2): 25 mg via INTRAVENOUS
  Filled 2019-10-03 (×3): qty 1

## 2019-10-03 MED ORDER — CLINDAMYCIN PHOSPHATE 900 MG/50ML IV SOLN
900.0000 mg | INTRAVENOUS | Status: AC
  Administered 2019-10-03: 900 mg via INTRAVENOUS

## 2019-10-03 MED ORDER — FENTANYL CITRATE (PF) 100 MCG/2ML IJ SOLN
50.0000 ug | INTRAMUSCULAR | Status: DC | PRN
  Administered 2019-10-03: 100 ug via INTRAVENOUS
  Administered 2019-10-03: 50 ug via INTRAVENOUS
  Administered 2019-10-04: 100 ug via INTRAVENOUS
  Filled 2019-10-03 (×3): qty 2

## 2019-10-03 MED ORDER — SUGAMMADEX SODIUM 200 MG/2ML IV SOLN
INTRAVENOUS | Status: DC | PRN
  Administered 2019-10-03: 150 mg via INTRAVENOUS

## 2019-10-03 MED ORDER — PHENYLEPHRINE 40 MCG/ML (10ML) SYRINGE FOR IV PUSH (FOR BLOOD PRESSURE SUPPORT)
PREFILLED_SYRINGE | INTRAVENOUS | Status: DC | PRN
  Administered 2019-10-03 (×2): 40 ug via INTRAVENOUS

## 2019-10-03 MED ORDER — FENTANYL CITRATE (PF) 100 MCG/2ML IJ SOLN
INTRAMUSCULAR | Status: AC
  Filled 2019-10-03: qty 2

## 2019-10-03 MED ORDER — ZOLPIDEM TARTRATE 10 MG PO TABS: 10.0000 mg | ORAL_TABLET | Freq: Every evening | ORAL | Status: DC | PRN

## 2019-10-03 MED ORDER — PROPOFOL 10 MG/ML IV BOLUS
INTRAVENOUS | Status: AC
  Filled 2019-10-03: qty 20

## 2019-10-03 MED ORDER — ENOXAPARIN SODIUM 40 MG/0.4ML ~~LOC~~ SOLN
40.0000 mg | SUBCUTANEOUS | Status: DC
  Administered 2019-10-04: 40 mg via SUBCUTANEOUS
  Filled 2019-10-03: qty 0.4

## 2019-10-03 MED ORDER — HYDROMORPHONE HCL 1 MG/ML IJ SOLN: 0.5000 mg | INTRAMUSCULAR | Status: DC | PRN

## 2019-10-03 MED ORDER — PROMETHAZINE HCL 25 MG/ML IJ SOLN
6.2500 mg | INTRAMUSCULAR | Status: DC | PRN
  Administered 2019-10-03: 12.5 mg via INTRAVENOUS
  Filled 2019-10-03: qty 1

## 2019-10-03 MED ORDER — KETOROLAC TROMETHAMINE 30 MG/ML IJ SOLN
30.0000 mg | Freq: Four times a day (QID) | INTRAMUSCULAR | Status: AC
  Administered 2019-10-03 – 2019-10-04 (×4): 30 mg via INTRAVENOUS
  Filled 2019-10-03 (×4): qty 1

## 2019-10-03 MED ORDER — DEXAMETHASONE SODIUM PHOSPHATE 4 MG/ML IJ SOLN
INTRAMUSCULAR | Status: DC | PRN
  Administered 2019-10-03: 10 mg via INTRAVENOUS

## 2019-10-03 SURGICAL SUPPLY — 45 items
APPLIER CLIP 13 LRG OPEN (CLIP) ×2
CELLS DAT CNTRL 66122 CELL SVR (MISCELLANEOUS) ×1 IMPLANT
CLIP APPLIE 13 LRG OPEN (CLIP) ×1 IMPLANT
CLOTH BEACON ORANGE TIMEOUT ST (SAFETY) ×2 IMPLANT
COVER LIGHT HANDLE STERIS (MISCELLANEOUS) ×4 IMPLANT
COVER WAND RF STERILE (DRAPES) ×4 IMPLANT
DERMABOND ADVANCED (GAUZE/BANDAGES/DRESSINGS) ×1
DERMABOND ADVANCED .7 DNX12 (GAUZE/BANDAGES/DRESSINGS) ×1 IMPLANT
DRAPE WARM FLUID 44X44 (DRAPES) ×2 IMPLANT
DRSG OPSITE POSTOP 4X8 (GAUZE/BANDAGES/DRESSINGS) ×2 IMPLANT
ELECT REM PT RETURN 9FT ADLT (ELECTROSURGICAL) ×2
ELECTRODE REM PT RTRN 9FT ADLT (ELECTROSURGICAL) ×1 IMPLANT
GAUZE 4X4 16PLY RFD (DISPOSABLE) ×2 IMPLANT
GLOVE BIOGEL PI IND STRL 7.0 (GLOVE) ×4 IMPLANT
GLOVE BIOGEL PI IND STRL 8 (GLOVE) ×1 IMPLANT
GLOVE BIOGEL PI INDICATOR 7.0 (GLOVE) ×4
GLOVE BIOGEL PI INDICATOR 8 (GLOVE) ×1
GLOVE ECLIPSE 8.0 STRL XLNG CF (GLOVE) ×2 IMPLANT
GOWN STRL REUS W/TWL LRG LVL3 (GOWN DISPOSABLE) ×4 IMPLANT
GOWN STRL REUS W/TWL XL LVL3 (GOWN DISPOSABLE) ×2 IMPLANT
HEMOSTAT ARISTA ABSORB 3G PWDR (HEMOSTASIS) ×2 IMPLANT
INST SET MAJOR GENERAL (KITS) ×2 IMPLANT
KIT BLADEGUARD II DBL (SET/KITS/TRAYS/PACK) ×2 IMPLANT
KIT TURNOVER KIT A (KITS) ×2 IMPLANT
MANIFOLD NEPTUNE II (INSTRUMENTS) ×2 IMPLANT
NEEDLE HYPO 21X1.5 SAFETY (NEEDLE) ×2 IMPLANT
NS IRRIG 1000ML POUR BTL (IV SOLUTION) ×4 IMPLANT
PACK MAJOR ABDOMINAL (CUSTOM PROCEDURE TRAY) ×2 IMPLANT
PAD ARMBOARD 7.5X6 YLW CONV (MISCELLANEOUS) ×2 IMPLANT
RETRACTOR WOUND ALXS 18CM MED (MISCELLANEOUS) ×1 IMPLANT
RTRCTR WOUND ALEXIS 18CM MED (MISCELLANEOUS) ×2
RTRCTR WOUND ALEXIS O 18CM MED (MISCELLANEOUS) ×2
SET BASIN LINEN APH (SET/KITS/TRAYS/PACK) ×2 IMPLANT
SPONGE LAP 18X18 RF (DISPOSABLE) ×8 IMPLANT
SUT CHROMIC 0 CT 1 (SUTURE) ×4 IMPLANT
SUT MON AB 3-0 SH 27 (SUTURE) ×6 IMPLANT
SUT PLAIN 2 0 XLH (SUTURE) IMPLANT
SUT VIC AB 0 CT1 27 (SUTURE) ×3
SUT VIC AB 0 CT1 27XCR 8 STRN (SUTURE) ×3 IMPLANT
SUT VIC AB 0 CTX 36 (SUTURE) ×1
SUT VIC AB 0 CTX36XBRD ANTBCTR (SUTURE) ×1 IMPLANT
SUT VICRYL 3 0 (SUTURE) ×2 IMPLANT
SYR 20ML LL LF (SYRINGE) ×2 IMPLANT
TOWEL SURG RFD BLUE STRL DISP (DISPOSABLE) ×2 IMPLANT
TRAY FOLEY MTR SLVR 16FR STAT (SET/KITS/TRAYS/PACK) ×2 IMPLANT

## 2019-10-03 NOTE — Anesthesia Procedure Notes (Signed)
Procedure Name: Intubation Date/Time: 10/03/2019 11:41 AM Performed by: Hewitt Blade, CRNA Pre-anesthesia Checklist: Patient identified, Emergency Drugs available, Suction available and Patient being monitored Patient Re-evaluated:Patient Re-evaluated prior to induction Oxygen Delivery Method: Circle system utilized Preoxygenation: Pre-oxygenation with 100% oxygen Induction Type: IV induction Ventilation: Mask ventilation without difficulty Laryngoscope Size: Mac and 3 Grade View: Grade I Tube type: Oral Tube size: 7.0 mm Number of attempts: 1 Airway Equipment and Method: Stylet and Oral airway Placement Confirmation: ETT inserted through vocal cords under direct vision,  positive ETCO2 and breath sounds checked- equal and bilateral Secured at: 21 cm Tube secured with: Tape Dental Injury: Teeth and Oropharynx as per pre-operative assessment

## 2019-10-03 NOTE — Interval H&P Note (Signed)
History and Physical Interval Note:  10/03/2019 11:08 AM  Anna Willis  has presented today for surgery, with the diagnosis of MENOMETRORRHAGIA DYSMENORRHEA DYSPAREUIA.  The various methods of treatment have been discussed with the patient and family. After consideration of risks, benefits and other options for treatment, the patient has consented to  Procedure(s): HYSTERECTOMY ABDOMINAL (Bilateral) OPEN BILATERAL SALPINGECTOMY (Bilateral) as a surgical intervention.  The patient's history has been reviewed, patient examined, no change in status, stable for surgery.  I have reviewed the patient's chart and labs.  Questions were answered to the patient's satisfaction.     [name]

## 2019-10-03 NOTE — Transfer of Care (Signed)
Immediate Anesthesia Transfer of Care Note  Patient: Geneive Sandstrom  Procedure(s) Performed: TOTAL ABDOMINAL HYSTERECTOMY OPEN BILATERAL SALPINGECTOMY  Patient Location: PACU  Anesthesia Type:General  Level of Consciousness: awake, alert  and oriented  Airway & Oxygen Therapy: Patient Spontanous Breathing  Post-op Assessment: Report given to RN and Post -op Vital signs reviewed and stable  Post vital signs: Reviewed and stable  Last Vitals:  Vitals Value Taken Time  BP    Temp    Pulse 100 10/03/19 1336  Resp 14 10/03/19 1336  SpO2 91 % 10/03/19 1336  Vitals shown include unvalidated device data.  Last Pain:  Vitals:   10/03/19 0955  TempSrc: Oral      Patients Stated Pain Goal: 3 (10/03/19 0955)  Complications: No complications documented.

## 2019-10-03 NOTE — Anesthesia Preprocedure Evaluation (Signed)
Anesthesia Evaluation  Patient identified by MRN, date of birth, ID band Patient awake    Reviewed: Allergy & Precautions, NPO status , Patient's Chart, lab work & pertinent test results, reviewed documented beta blocker date and time   History of Anesthesia Complications (+) history of anesthetic complications (muscle aches and weakness with succinylcholine)  Airway Mallampati: II  TM Distance: >3 FB Neck ROM: Full    Dental no notable dental hx. (+) Teeth Intact, Dental Advisory Given   Pulmonary former smoker,    Pulmonary exam normal breath sounds clear to auscultation- rhonchi (-) decreased breath sounds(-) wheezing (-) stridorstable (-) rales(-) intubated    Cardiovascular Exercise Tolerance: Good Pt. on home beta blockers Normal cardiovascular exam+ dysrhythmias (tachycardia )  Rhythm:Regular Rate:Normal - Systolic murmurs, - Diastolic murmurs, - Friction Rub, - Carotid Bruit, - Peripheral Edema and - Systolic Click    Neuro/Psych  Headaches, Anxiety  Neuromuscular disease (neurofibromatosis)    GI/Hepatic Neg liver ROS, GERD  Medicated and Controlled,  Endo/Other  negative endocrine ROS  Renal/GU negative Renal ROS     Musculoskeletal Neurofibromatosis    Abdominal Normal abdominal exam  (+)   Peds  Hematology negative hematology ROS (+)   Anesthesia Other Findings   Reproductive/Obstetrics                             Anesthesia Physical Anesthesia Plan  ASA: II  Anesthesia Plan: General   Post-op Pain Management:    Induction: Intravenous  PONV Risk Score and Plan: 4 or greater and Ondansetron, Dexamethasone, Midazolam and Scopolamine patch - Pre-op  Airway Management Planned: Oral ETT  Additional Equipment:   Intra-op Plan:   Post-operative Plan: Extubation in OR  Informed Consent: I have reviewed the patients History and Physical, chart, labs and discussed the  procedure including the risks, benefits and alternatives for the proposed anesthesia with the patient or authorized representative who has indicated his/her understanding and acceptance.     Dental advisory given  Plan Discussed with: CRNA and Surgeon  Anesthesia Plan Comments:         Anesthesia Quick Evaluation

## 2019-10-03 NOTE — Op Note (Signed)
Preoperative diagnosis:  1.  Menometrorrhagia/dysfunctional uterine bleeding                                         2.  Dysmenorrhea                                         3.  Dyspareunia, deep                                         4.  Weight gain and other adverse symptoms and chronic megestrol therapy  Postoperative diagnosis:  Same as above  Procedure:  Abdominal hysterectomy, total, with bilateral salpingectomy  Surgeon:  Florian Buff  Assistant:    Anesthesia:  General endotracheal  Preoperative clinical summary:    Twylla Arceneaux Danneris a 42 y.I.[id number] with No LMP recorded. (Menstrual status: Other).admitted for a total abdominal hysterectomy with bilateral salpingectomy,using a mini laparotomy incision The issues this patient has had dates back many years I will summarize the most recent 18 months that has brought [place] to the point of definitive surgical management The patient was seen in the emergency department in December 2019 with a complaint of almost continuous bleeding She would essentially bleed at that time every 11 days with clots and severe cramping associated The emergency department contacted me at that point and I placed her on megestrol therapy in the hopes of complete endometrial suppression I then saw her in the office on April 28, 2018 and continued her on megestrol continuously for the ensuing 3 months Her hemoglobin at that time was 11.5 The sonogram she had performed through her emergency department visit in December 2019 was essentially normal and noncontributory. By April 2020 the patient had been made amenorrhea on the chronic Megace therapy and wanted to continue that as she was doing so much better Unfortunately the patient experienced approximately a 25 pound weight gain by August 2020 and while she was for the most part amenorrheic she also had breakthrough bleeding A conversation with her primary care provided prompted her to seek  conversation versus placement of an intrauterine device which she ultimately declined She decided to go off the megestrol andtry Lystedaand by October 2020 she was back in the emergency department with uncontrolled vaginal bleeding once again At that time she was given medroxyprogesterone 300 mg IM to shut down her bleeding acutely and megestrol for ongoing management By December 2020 during the visit in my office we started discussing definitive therapy as we were unable to control her bleeding with anything other than megestrol but she was having ongoing problems with weight gain She was also having more problems with emotional lability as a result of the megestrol She was also subsequently developing dyspareunia 100% of the time which may or may not be related to megestrol therapy my impression not related Due to life circumstances she was inclined to delay any surgical management till later in 2021 and she remained on her megestrol  During today's visit Austynn's wife Stasia was on the call for the entire time during the visit The patient actually and I spent greater than 30 minutes discussing the surgery and what is likely to improve and maybe not  improve Because of her 100% dyspareunia which on exam is related to cervical manipulation or bump dyspareunia it certainly is indicated for a complete uterine and cervix removal to be performed She is not having chronic adnexal pain Her tubes will be removed for ovarian cancer prophylaxis indication Jamariya Davidoff also has some episodic random out of the blue sharp abdominal pain which I do not believe is related to her GYN issues She does have similar pain after intercourse but it is also somewhat random My suspicion is this is gastroenterology related and it seems to be bowel spasm especially as it is related to breaking out in a cold sweat and feeling nauseated at the same time In any event we discussed the likelihood of this not improving with her  surgery but certainly because of her ongoing menometrorrhagia and dysfunctional uterine bleeding while on anything other than megestrol certainly this will be managed by the hysterectomy Her dyspareunia will also be eliminated because were doing a complete uterine and cervix removal It certainly possible that she could have endometriosis and she understands that we will be looking for that intraoperatively A repeat sonogram was performed in April in our office and again did not show any abnormal pelvic anatomical findings  The patient and her wife understand the indications for procedure the alternatives which we have exhausted and the complications that may arise as a result of surgery including postoperative infection,excessive bleeding,anesthesia related complications as well as thromboembolic events.We will certainly take perioperative steps to minimize all of these risk.  All questions were answered and the decision was made to go forward later this month.   Intraoperative findings: Normal uterus tubes and ovaries no evidence of endometriosis in the peritoneal surfaces.  No adhesions status post appendectomy  Description of operation:  Patient was taken to the operating room and placed in the supine position where she underwent general endotracheal anesthesia.  She was then prepped and draped in the usual sterile fashion and a Foley catheter was placed for continuous bladder drainage.  A Pfannenstiel skin incision was made and carried down sharply to the rectus fascia which was scored in the midline and extended laterally.  The fascia was taken off the muscles superiorly and inferiorly without difficulty.  The muscles were divided.  The peritoneal cavity was entered.  An medium Alexis self-retaining retractor was placed.  The upper abdomen was packed away. Both uterine cornu were grasped with Coker clamps.  The left round ligament was suture ligated and coagulated with the electrocautery  unit.  The left vesicouterine serosal flap was created.  An avascular window in in the peritoneum was created and the utero-ovarian ligament was cross clamped, cut and suture ligated.  The right round ligament was suture ligated and cut with the electrocautery unit.  The vesicouterine serosal flap on the right was created.  An avascular window in the peritoneum was created and the right utero-ovarian ligament was cross clamped, cut and double suture ligated.  Thus both ovaries were preserved.  The uterine vessels were skeletonized bilaterally.  The uterine vessels were clamped bilaterally,  then cut and suture ligated.  Two more pedicles were taken down the cervix medial to the uterine vessels.  Each pedicle was clamped cut and suture ligated with good resulting hemostasis.  The vagina was then crossclamped and the specimen removed.  The vagina was closed with vaginal angle sutures and then interrupted figure-of-eight sutures for hemostasis and anatomical closure.  the pelvis was irrigated vigorously and all pedicles  were examined and found to be hemostatic.  Both Fallpoian tubes were removed by cross clamping with a Kelley clamp and a fore and aft 3-0 monocryl suture for hemostasis.  All specimens were sent to pathology for routine evaluation.  The Alexis self-retaining retractor was removed and the pelvis was irrigated vigorously.  All packs were removed and all counts were correct at this point x 3.  The muscles and peritoneum were reapproximated loosely.  The fascia was closed with 0 Vicryl running.  The subcutaneous tissue was reapproximated using 2-0 plain gut.  The skin was closed using 3-0 Vicryl on a Keith needle in a subcuticular fashion.  Dermabond was then applied for additional wound integrity and to serve as a postoperative bacterial barrier.  The patient was awakened from anesthesia taken to the recovery room in good stable condition. All sponge instrument and needle counts were correct x 3.  The  patient received Ancef and Toradol prophylactically preoperatively.  Estimated blood loss for the procedure was 50  cc.  [name], MD  10/03/2019 1:14 PM

## 2019-10-03 NOTE — Anesthesia Postprocedure Evaluation (Signed)
Anesthesia Post Note  Patient: Anna Willis  Procedure(s) Performed: TOTAL ABDOMINAL HYSTERECTOMY OPEN BILATERAL SALPINGECTOMY  Patient location during evaluation: PACU Anesthesia Type: General Level of consciousness: awake and alert Pain management: pain level controlled Vital Signs Assessment: post-procedure vital signs reviewed and stable Respiratory status: spontaneous breathing Cardiovascular status: stable Postop Assessment: no apparent nausea or vomiting Anesthetic complications: no   No complications documented.   Last Vitals:  Vitals:   10/03/19 0955  BP: 116/83  Pulse: 100  Resp: 17  Temp: 37.2 C  SpO2: 97%    Last Pain:  Vitals:   10/03/19 0955  TempSrc: Oral                 Samnang Shugars Hristova

## 2019-10-03 NOTE — Progress Notes (Signed)
AC aware needing meds for patient. Pt resting. Nad.

## 2019-10-03 NOTE — Addendum Note (Signed)
Addendum  created 10/03/19 1648 by Nethaniel Mattie C, MD   Order list changed    

## 2019-10-04 ENCOUNTER — Other Ambulatory Visit: Payer: Self-pay | Admitting: Obstetrics & Gynecology

## 2019-10-04 ENCOUNTER — Encounter (HOSPITAL_COMMUNITY): Payer: Self-pay | Admitting: Obstetrics & Gynecology

## 2019-10-04 DIAGNOSIS — N946 Dysmenorrhea, unspecified: Secondary | ICD-10-CM | POA: Diagnosis present

## 2019-10-04 DIAGNOSIS — N921 Excessive and frequent menstruation with irregular cycle: Secondary | ICD-10-CM | POA: Diagnosis present

## 2019-10-04 DIAGNOSIS — Z820 Family history of epilepsy and other diseases of the nervous system: Secondary | ICD-10-CM | POA: Diagnosis not present

## 2019-10-04 DIAGNOSIS — M419 Scoliosis, unspecified: Secondary | ICD-10-CM | POA: Diagnosis present

## 2019-10-04 DIAGNOSIS — N200 Calculus of kidney: Secondary | ICD-10-CM | POA: Diagnosis present

## 2019-10-04 DIAGNOSIS — Q8500 Neurofibromatosis, unspecified: Secondary | ICD-10-CM | POA: Diagnosis not present

## 2019-10-04 DIAGNOSIS — Z79899 Other long term (current) drug therapy: Secondary | ICD-10-CM | POA: Diagnosis not present

## 2019-10-04 DIAGNOSIS — Z791 Long term (current) use of non-steroidal anti-inflammatories (NSAID): Secondary | ICD-10-CM | POA: Diagnosis not present

## 2019-10-04 DIAGNOSIS — Z20822 Contact with and (suspected) exposure to covid-19: Secondary | ICD-10-CM | POA: Diagnosis present

## 2019-10-04 DIAGNOSIS — N941 Unspecified dyspareunia: Secondary | ICD-10-CM | POA: Diagnosis present

## 2019-10-04 DIAGNOSIS — Z87891 Personal history of nicotine dependence: Secondary | ICD-10-CM | POA: Diagnosis not present

## 2019-10-04 DIAGNOSIS — Z803 Family history of malignant neoplasm of breast: Secondary | ICD-10-CM | POA: Diagnosis not present

## 2019-10-04 DIAGNOSIS — Z8249 Family history of ischemic heart disease and other diseases of the circulatory system: Secondary | ICD-10-CM | POA: Diagnosis not present

## 2019-10-04 DIAGNOSIS — N938 Other specified abnormal uterine and vaginal bleeding: Secondary | ICD-10-CM | POA: Diagnosis present

## 2019-10-04 LAB — BASIC METABOLIC PANEL
Anion gap: 10 (ref 5–15)
BUN: 10 mg/dL (ref 6–20)
CO2: 22 mmol/L (ref 22–32)
Calcium: 8.9 mg/dL (ref 8.9–10.3)
Chloride: 102 mmol/L (ref 98–111)
Creatinine, Ser: 0.59 mg/dL (ref 0.44–1.00)
GFR calc Af Amer: >60 mL/min (ref >60)
GFR calc non Af Amer: >60 mL/min (ref >60)
Glucose, Bld: 113 mg/dL — ABNORMAL HIGH (ref 70–99)
Potassium: 4.1 mmol/L (ref 3.5–5.1)
Sodium: 134 mmol/L — ABNORMAL LOW (ref 135–145)

## 2019-10-04 LAB — SURGICAL PATHOLOGY

## 2019-10-04 LAB — CBC
HCT: 36.6 % (ref 36.0–46.0)
Hemoglobin: 11.8 g/dL — ABNORMAL LOW (ref 12.0–15.0)
MCH: 29.5 pg (ref 26.0–34.0)
MCHC: 32.2 g/dL (ref 30.0–36.0)
MCV: 91.5 fL (ref 80.0–100.0)
Platelets: 280 10*3/uL (ref 150–400)
RBC: 4 MIL/uL (ref 3.87–5.11)
RDW: 12.1 % (ref 11.5–15.5)
WBC: 10.7 10*3/uL — ABNORMAL HIGH (ref 4.0–10.5)
nRBC: 0 % (ref 0.0–0.2)

## 2019-10-04 MED ORDER — PROMETHAZINE HCL 25 MG PO TABS
25.0000 mg | ORAL_TABLET | Freq: Four times a day (QID) | ORAL | 1 refills | Status: DC | PRN
Start: 2019-10-04 — End: 2020-04-02

## 2019-10-04 MED ORDER — OXYCODONE-ACETAMINOPHEN 5-325 MG PO TABS: 1.0000 | ORAL_TABLET | ORAL | 0 refills | Status: DC | PRN

## 2019-10-04 MED ORDER — OXYCODONE HCL 5 MG PO TABS: 5.0000 mg | ORAL_TABLET | ORAL | 0 refills | Status: DC | PRN

## 2019-10-04 MED ORDER — KETOROLAC TROMETHAMINE 10 MG PO TABS
10.0000 mg | ORAL_TABLET | Freq: Three times a day (TID) | ORAL | 0 refills | Status: DC | PRN
Start: 2019-10-04 — End: 2019-12-14

## 2019-10-04 NOTE — Plan of Care (Signed)

## 2019-10-04 NOTE — Discharge Instructions (Signed)
Abdominal Hysterectomy, Care After This sheet gives you information about how to care for yourself after your procedure. Your health care provider may also give you more specific instructions. If you have problems or questions, contact your health care provider. What can I expect after the procedure? After your procedure, it is common to have:  Pain.  Fatigue.  Poor appetite.  Less interest in sex.  Vaginal bleeding and discharge. You may need to use a sanitary napkin after this procedure. Follow these instructions at home: Bathing  Do not take baths, swim, or use a hot tub until your health care provider approves. Ask your health care provider if you can take showers. You may only be allowed to take sponge baths for bathing.  Keep the bandage (dressing) dry until your health care provider says it can be removed. Incision care   Follow instructions from your health care provider about how to take care of your incision. Make sure you: ? Wash your hands with soap and water before you change your bandage (dressing). If soap and water are not available, use hand sanitizer. ? Change your dressing as told by your health care provider. ? Leave stitches (sutures), skin glue, or adhesive strips in place. These skin closures may need to stay in place for 2 weeks or longer. If adhesive strip edges start to loosen and curl up, you may trim the loose edges. Do not remove adhesive strips completely unless your health care provider tells you to do that.  Check your incision area every day for signs of infection. Check for: ? Redness, swelling, or pain. ? Fluid or blood. ? Warmth. ? Pus or a bad smell. Activity  Do gentle, daily exercises as told by your health care provider. You may be told to take short walks every day and go farther each time.  Do not lift anything that is heavier than 10 lb (4.5 kg), or the limit that your health care provider tells you, until he or she says that it is  safe.  Do not drive or use heavy machinery while taking prescription pain medicine.  Do not drive for 24 hours if you were given a medicine to help you relax (sedative).  Follow your health care provider's instructions about exercise, driving, and general activities. Ask your health care provider what activities are safe for you. Lifestyle  Do not douche, use tampons, or have sex for at least 6 weeks or as told by your health care provider.  Do not drink alcohol until your health care provider approves.  Drink enough fluid to keep your urine clear or pale yellow.  Try to have someone at home with you for the first 1-2 weeks to help.  Do not use any products that contain nicotine or tobacco, such as cigarettes and e-cigarettes. These can delay healing. If you need help quitting, ask your health care provider. General instructions  Take over-the-counter and prescription medicines only as told by your health care provider.  Do not take aspirin or ibuprofen. These medicines can cause bleeding.  To prevent or treat constipation while you are taking prescription pain medicine, your health care provider may recommend that you: ? Drink enough fluid to keep your urine clear or pale yellow. ? Take over-the-counter or prescription medicines. ? Eat foods that are high in fiber, such as fresh fruits and vegetables, whole grains, and beans. ? Limit foods that are high in fat and processed sugars, such as fried and sweet foods.  Keep all   follow-up visits as told by your health care provider. This is important. Contact a health care provider if:  You have chills or fever.  You have redness, swelling, or pain around your incision.  You have fluid or blood coming from your incision.  Your incision feels warm to the touch.  You have pus or a bad smell coming from your incision.  Your incision breaks open.  You feel dizzy or light-headed.  You have pain or bleeding when you urinate.  You  have persistent diarrhea.  You have persistent nausea and vomiting.  You have abnormal vaginal discharge.  You have a rash.  You have any type of abnormal reaction or you develop an allergy to your medicine.  Your pain medicine does not help. Get help right away if:  You have a fever and your symptoms suddenly get worse.  You have severe abdominal pain.  You have shortness of breath.  You faint.  You have pain, swelling, or redness in your leg.  You have heavy vaginal bleeding with blood clots. Summary  After your procedure, it is common to have pain, fatigue and vaginal discharge.  Do not take baths, swim, or use a hot tub until your health care provider approves. Ask your health care provider if you can take showers. You may only be allowed to take sponge baths for bathing.  Follow your health care provider's instructions about exercise, driving, and general activities. Ask your health care provider what activities are safe for you.  Do not lift anything that is heavier than 10 lb (4.5 kg), or the limit that your health care provider tells you, until he or she says that it is safe.  Try to have someone at home with you for the first 1-2 weeks to help. This information is not intended to replace advice given to you by your health care provider. Make sure you discuss any questions you have with your health care provider. Document Revised: 05/02/2018 Document Reviewed: 03/17/2016 Elsevier Patient Education  2020 Elsevier Inc.  

## 2019-10-04 NOTE — Discharge Summary (Signed)
Physician Discharge Summary  Patient ID: Anna Willis MRN: [id number] DOB/AGE: [birth year] 39 y.o.  Admit date: 10/03/2019 Discharge date: 10/04/2019  Admission Diagnoses: S/P TAH bilateral salpingectomy  Discharge Diagnoses:  Active Problems:   S/P hysterectomy   Discharged Condition: good  Hospital Course: unremarkable post op course  Consults: None  Significant Diagnostic Studies: labs:   Results for orders placed or performed during the hospital encounter of 10/03/19 (from the past 72 hour(s))  CBC     Status: Abnormal   Collection Time: 10/04/19  4:47 AM  Result Value Ref Range   WBC 10.7 (H) 4.0 - 10.5 K/uL   RBC 4.00 3.87 - 5.11 MIL/uL   Hemoglobin 11.8 (L) 12.0 - 15.0 g/dL   HCT 36.6 36 - 46 %   MCV 91.5 80.0 - 100.0 fL   MCH 29.5 26.0 - 34.0 pg   MCHC 32.2 30.0 - 36.0 g/dL   RDW 12.1 11.5 - 15.5 %   Platelets 280 150 - 400 K/uL   nRBC 0.0 0.0 - 0.2 %    Comment: Performed at [facility], [address] [place], [place] [address]  Basic metabolic panel     Status: Abnormal   Collection Time: 10/04/19  4:47 AM  Result Value Ref Range   Sodium 134 (L) 135 - 145 mmol/L   Potassium 4.1 3.5 - 5.1 mmol/L   Chloride 102 98 - 111 mmol/L   CO2 22 22 - 32 mmol/L   Glucose, Bld 113 (H) 70 - 99 mg/dL    Comment: Glucose reference range applies only to samples taken after fasting for at least 8 hours.   BUN 10 6 - 20 mg/dL   Creatinine, Ser 0.59 0.44 - 1.00 mg/dL   Calcium 8.9 8.9 - 10.3 mg/dL   GFR calc non Af Amer >60 >60 mL/min   GFR calc Af Amer >60 >60 mL/min   Anion gap 10 5 - 15    Comment: Performed at [facility] [address], [place], [place] [address]    Treatments: surgery: TAH BS  Discharge Exam: Blood pressure 100/76, pulse (!) 114, temperature 98 F (36.7 C), temperature source Oral, resp. rate 16, height 5' (1.524 m), weight 72.5 kg, last menstrual period 09/27/2019, SpO2 99 %. General appearance: alert, cooperative and no distress GI:  soft, non-tender; bowel sounds normal; no masses,  no organomegaly Incision/Wound:clean dry intact  Disposition: Discharge disposition: 01-Home or Self Care       Discharge Instructions    Call MD for:  persistant nausea and vomiting   Complete by: As directed    Call MD for:  severe uncontrolled pain   Complete by: As directed    Call MD for:  temperature >100.4   Complete by: As directed    Diet - low sodium heart healthy   Complete by: As directed    Driving Restrictions   Complete by: As directed    No driving for 1 week   Increase activity slowly   Complete by: As directed    Leave dressing on - Keep it clean, dry, and intact until clinic visit   Complete by: As directed    Lifting restrictions   Complete by: As directed    Do not lift more than 10 pounds for 6 weeks   Sexual Activity Restrictions   Complete by: As directed    No sex for 8 weeks     Allergies as of 10/04/2019      Reactions  Amoxicillin Nausea And Vomiting   Projectile vomiting   Rocephin [ceftriaxone] Nausea And Vomiting   Succinylcholine Other (See Comments)   Muscle weakness/muscle pain.   Erythromycin Rash      Medication List    STOP taking these medications   acetaminophen 500 MG tablet Commonly known as: TYLENOL   ibuprofen 200 MG tablet Commonly known as: ADVIL   megestrol 40 MG tablet Commonly known as: MEGACE   methocarbamol 500 MG tablet Commonly known as: ROBAXIN   Midol Complete 500-60-15 MG Tabs Generic drug: Acetaminophen-Caff-Pyrilamine   ondansetron 4 MG disintegrating tablet Commonly known as: Zofran ODT   ZzzQuil 25 MG Caps Generic drug: diphenhydrAMINE HCl (Sleep)     TAKE these medications   AIMOVIG [place] Inject 1 Dose into the skin every 30 (thirty) days.   Anucort-HC 25 MG suppository Generic drug: hydrocortisone Place 1-2 suppositories rectally daily as needed for hemorrhoids.   cyclobenzaprine 10 MG tablet Commonly known as: FLEXERIL Take 10 mg  by mouth every 8 (eight) hours as needed for muscle spasms (muscle pain.).   docusate sodium 100 MG capsule Commonly known as: COLACE Take 1 capsule (100 mg total) by mouth 2 (two) times daily. What changed: when to take this   esomeprazole 20 MG capsule Commonly known as: NEXIUM Take 20 mg by mouth daily before breakfast.   Imitrex 100 MG tablet Generic drug: SUMAtriptan Take 50 mg by mouth every 2 (two) hours as needed for migraine or headache.   Jublia 10 % Soln Generic drug: Efinaconazole Apply 1 application topically 4 (four) times a week. 48 week course starting on 04/18/2019   ketorolac 10 MG tablet Commonly known as: TORADOL Take 1 tablet (10 mg total) by mouth every 8 (eight) hours as needed.   metoprolol succinate 25 MG 24 hr tablet Commonly known as: TOPROL-XL Take 25 mg by mouth daily with supper.   oxyCODONE 5 MG immediate release tablet Commonly known as: Oxy IR/ROXICODONE Take 1-2 tablets (5-10 mg total) by mouth every 4 (four) hours as needed for moderate pain.   polyethylene glycol 17 g packet Commonly known as: MIRALAX / GLYCOLAX Take 17 g by mouth daily.   promethazine 25 MG tablet Commonly known as: PHENERGAN Take 1 tablet (25 mg total) by mouth every 6 (six) hours as needed for nausea.   Wellbutrin SR 150 MG 12 hr tablet Generic drug: buPROPion Take 150 mg by mouth daily with supper.            Discharge Care Instructions  (From admission, onward)         Start     Ordered   10/04/19 0000  Leave dressing on - Keep it clean, dry, and intact until clinic visit        10/04/19 1311          Follow-up Information    [name], MD Follow up on 10/09/2019.   Specialties: Obstetrics and Gynecology, Radiology Contact information: [address] [place] [address] [phone]               Signed: [name] 10/04/2019, 1:12 PM

## 2019-10-04 NOTE — Progress Notes (Signed)
   10/03/19 2340  Assess: MEWS Score  Pulse Rate (!) 125  Level of Consciousness Alert  Assess: MEWS Score  MEWS Temp 0  MEWS Systolic 0  MEWS Pulse 2  MEWS RR 0  MEWS LOC 0  MEWS Score 2  MEWS Score Color Yellow  Assess: if the MEWS score is Yellow or Red  Were vital signs taken at a resting state? Yes  Focused Assessment Documented focused assessment  Take Vital Signs  Increase Vital Sign Frequency  Yellow: Q 2hr X 2 then Q 4hr X 2, if remains yellow, continue Q 4hrs  Escalate  MEWS: Escalate Yellow: discuss with charge nurse/RN and consider discussing with provider and RRT  Notify: Charge Nurse/RN  Name of Charge Nurse/RN Notified Donah Driver  Date Charge Nurse/RN Notified 10/03/19  Time Charge Nurse/RN Notified 2345  Notify: Provider  Provider Name/Title Eure  Date Provider Notified 10/03/19  Time Provider Notified 2345  Notification Type Page  Notification Reason Other (Comment) (elevated HR)

## 2019-10-05 ENCOUNTER — Other Ambulatory Visit: Payer: Self-pay | Admitting: *Deleted

## 2019-10-05 NOTE — Patient Outreach (Signed)
[facility]) Care Management  10/05/2019  Anna Willis May 14, 1980 [id number]   Transition of care call/case closure   Initial outreach: 10/05/2019 Insurance: [employer]   Subjective: Initial successful telephone call to patient's preferred number in order to complete transition of care assessment; 2 HIPAA identifiers verified. Explained purpose of call and completed transition of care assessment.  States she is doing well, says surgical is unremarkable states surgical pain well managed with prescribed medications, tolerating diet, denies bladder problems however states bowels are sluggish with minimal bowel movement and only a small amount of gas passing.  Pt reports she has a history of constipation and difficult with her bowel movements. RN encouraged pt to contact her provider if pain, severe cramping or prolong issues with her GI issues. RN informed pt that this maybe normal due to her recent surgery (confirmed pt has passed gas). Pt aware if severe pain or discomfort to contact her provider (OBGYN) prior to her confirmed appointments next week. Pt reports support system are assisting with her recovery.  She denies any ongoing health issues and says she does not need a referral to one of the [place] chronic disease management programs.  She denies educational needs related to staying safe during the COVID 19 pandemic.    Objective:  Anna Willis was hospitalized at [facility]. She was discharged to home on 10/04/19 without the need for home health services or DME.   Assessment:  Patient voices good understanding of all discharge instructions. Completed transition of care template post discharge.   Plan:  Reviewed hospital discharge diagnosis (total abdominal hysterectomy)  and treatment plan using hospital discharge instructions, assessing medication adherence, reviewing postoperative problems requiring provider notification, and discussing the importance  of follow up with surgeon, primary care provider and specialists as directed.  Reviewed [place] healthy lifestyle program information to receive discounted premium for  2022  Verified patient is an active with "mychart". No ongoing care management needs identified so will close case to [facility] Care Management services and route successful outreach letter with [facility] Care Management pamphlet and 24 Hour Nurse Line Magnet to [employer] Care Management clinical pool to be mailed to patient's home address.    Patient was recently discharged from hospital and all medications have been reviewed.  Anna Cousin, RN Care Management Coordinator [facility] Main Office [phone]

## 2019-10-09 ENCOUNTER — Encounter: Payer: Self-pay | Admitting: Obstetrics & Gynecology

## 2019-10-09 ENCOUNTER — Ambulatory Visit (INDEPENDENT_AMBULATORY_CARE_PROVIDER_SITE_OTHER): Payer: No Typology Code available for payment source | Admitting: Obstetrics & Gynecology

## 2019-10-09 VITALS — BP 115/73 | HR 95 | Ht 61.0 in | Wt 160.0 lb

## 2019-10-09 DIAGNOSIS — Z90710 Acquired absence of both cervix and uterus: Secondary | ICD-10-CM

## 2019-10-09 DIAGNOSIS — Z9889 Other specified postprocedural states: Secondary | ICD-10-CM

## 2019-10-09 MED ORDER — KETOROLAC TROMETHAMINE 10 MG PO TABS: 10.0000 mg | ORAL_TABLET | Freq: Three times a day (TID) | ORAL | 0 refills | Status: DC | PRN

## 2019-10-09 MED FILL — KETOROLAC 10 MG TABLET: 10 | 5 days supply | Qty: 15 | Fill #0

## 2019-10-09 NOTE — Progress Notes (Signed)
  HPI: Patient returns for routine postoperative follow-up having undergone TAH BS on 10/03/19.  The patient's immediate postoperative recovery has been unremarkable. Since hospital discharge the patient reports normal incisional pain and sluggish GI return.   Current Outpatient Medications: ANUCORT-HC 25 MG suppository, Place 1-2 suppositories rectally daily as needed for hemorrhoids. , Disp: , Rfl:  buPROPion (WELLBUTRIN SR) 150 MG 12 hr tablet, Take 150 mg by mouth daily with supper. , Disp: , Rfl:  cyclobenzaprine (FLEXERIL) 10 MG tablet, Take 10 mg by mouth every 8 (eight) hours as needed for muscle spasms (muscle pain.). , Disp: , Rfl:  docusate sodium (COLACE) 100 MG capsule, Take 1 capsule (100 mg total) by mouth 2 (two) times daily. (Patient taking differently: Take 100 mg by mouth at bedtime. ), Disp: 60 capsule, Rfl: 1 Erenumab-aooe (AIMOVIG [place]), Inject 1 Dose into the skin every 30 (thirty) days., Disp: , Rfl:  esomeprazole (NEXIUM) 20 MG capsule, Take 20 mg by mouth daily before breakfast. , Disp: , Rfl:  JUBLIA 10 % SOLN, Apply 1 application topically 4 (four) times a week. 48 week course starting on 04/18/2019, Disp: , Rfl:  ketorolac (TORADOL) 10 MG tablet, Take 1 tablet (10 mg total) by mouth every 8 (eight) hours as needed., Disp: 15 tablet, Rfl: 0 metoprolol succinate (TOPROL-XL) 25 MG 24 hr tablet, Take 25 mg by mouth daily with supper. , Disp: , Rfl:  ondansetron (ZOFRAN) 4 MG tablet, Take 4 mg by mouth every 8 (eight) hours as needed for nausea or vomiting., Disp: , Rfl:  oxyCODONE (OXY IR/ROXICODONE) 5 MG immediate release tablet, Take 1-2 tablets (5-10 mg total) by mouth every 4 (four) hours as needed for moderate pain., Disp: 30 tablet, Rfl: 0 polyethylene glycol (MIRALAX / GLYCOLAX) 17 g packet, Take 17 g by mouth daily., Disp: , Rfl:  promethazine (PHENERGAN) 25 MG tablet, Take 1 tablet (25 mg total) by mouth every 6 (six) hours as needed for nausea., Disp: 30 tablet,  Rfl: 1 SUMAtriptan (IMITREX) 100 MG tablet, Take 50 mg by mouth every 2 (two) hours as needed for migraine or headache. , Disp: , Rfl:   No current facility-administered medications for this visit.    Blood pressure 115/73, pulse 95, height 5\' 1"  (1.549 m), weight 160 lb (72.6 kg), last menstrual period 09/27/2019.  Physical Exam: Incision clean dry intact Some bruising in varying ages Abdomen is soft benign normal  Diagnostic Tests:   Pathology: benign  Impression: S/p TAH BS  Plan: Routine post op care  Follow up: 4  weeks   Meds ordered this encounter  Medications  . ketorolac (TORADOL) 10 MG tablet    Sig: Take 1 tablet (10 mg total) by mouth every 8 (eight) hours as needed.    Dispense:  15 tablet    Refill:  0     09/29/2019, MD

## 2019-10-16 ENCOUNTER — Encounter: Payer: No Typology Code available for payment source | Admitting: Obstetrics & Gynecology

## 2019-10-17 ENCOUNTER — Other Ambulatory Visit: Payer: Self-pay | Admitting: Obstetrics & Gynecology

## 2019-10-17 MED ORDER — OXYCODONE HCL 5 MG PO TABS: 5.0000 mg | ORAL_TABLET | ORAL | 0 refills | Status: DC | PRN

## 2019-10-25 MED FILL — AIMOVIG 70 MG/ML SOAJ: 70 | 30 days supply | Qty: 1 | Fill #2

## 2019-10-31 MED FILL — buPROPion HCL ER (XL) 150 M: 150 | 30 days supply | Qty: 30 | Fill #2

## 2019-11-05 MED FILL — METOPROLOL SUCCINATE ER 25: 25 | 90 days supply | Qty: 90 | Fill #0

## 2019-11-06 ENCOUNTER — Encounter: Payer: Self-pay | Admitting: Obstetrics & Gynecology

## 2019-11-06 ENCOUNTER — Ambulatory Visit (INDEPENDENT_AMBULATORY_CARE_PROVIDER_SITE_OTHER): Payer: No Typology Code available for payment source | Admitting: Obstetrics & Gynecology

## 2019-11-06 VITALS — BP 113/81 | HR 91 | Ht 61.0 in | Wt 162.0 lb

## 2019-11-06 DIAGNOSIS — N942 Vaginismus: Secondary | ICD-10-CM

## 2019-11-06 DIAGNOSIS — Z90710 Acquired absence of both cervix and uterus: Secondary | ICD-10-CM

## 2019-11-06 NOTE — Progress Notes (Signed)
Patient ID: Anna Willis, female   DOB: 1980/10/09, 39 y.o.   MRN: [id number]  HPI: Patient returns for routine postoperative follow-up having undergone TAH BS on 10/03/19.  The patient's immediate postoperative recovery has been unremarkable. Since hospital discharge the patient reports some discomfort with stretching etc above lef thip.   Current Outpatient Medications: buPROPion (WELLBUTRIN SR) 150 MG 12 hr tablet, Take 150 mg by mouth daily with supper. , Disp: , Rfl:  Erenumab-aooe (AIMOVIG [place]), Inject 1 Dose into the skin every 30 (thirty) days., Disp: , Rfl:  esomeprazole (NEXIUM) 20 MG capsule, Take 20 mg by mouth daily before breakfast. , Disp: , Rfl:  JUBLIA 10 % SOLN, Apply 1 application topically 4 (four) times a week. 48 week course starting on 04/18/2019, Disp: , Rfl:  SUMAtriptan (IMITREX) 100 MG tablet, Take 50 mg by mouth every 2 (two) hours as needed for migraine or headache. , Disp: , Rfl:  cyclobenzaprine (FLEXERIL) 10 MG tablet, Take 10 mg by mouth every 8 (eight) hours as needed for muscle spasms (muscle pain.).  (Patient not taking: Reported on 11/06/2019), Disp: , Rfl:  docusate sodium (COLACE) 100 MG capsule, Take 1 capsule (100 mg total) by mouth 2 (two) times daily. (Patient not taking: Reported on 11/06/2019), Disp: 60 capsule, Rfl: 1 ketorolac (TORADOL) 10 MG tablet, Take 1 tablet (10 mg total) by mouth every 8 (eight) hours as needed. (Patient not taking: Reported on 11/06/2019), Disp: 15 tablet, Rfl: 0 ketorolac (TORADOL) 10 MG tablet, Take 1 tablet (10 mg total) by mouth every 8 (eight) hours as needed. (Patient not taking: Reported on 11/06/2019), Disp: 15 tablet, Rfl: 0 ondansetron (ZOFRAN) 4 MG tablet, Take 4 mg by mouth every 8 (eight) hours as needed for nausea or vomiting. (Patient not taking: Reported on 11/06/2019), Disp: , Rfl:  oxyCODONE (OXY IR/ROXICODONE) 5 MG immediate release tablet, Take 1-2 tablets (5-10 mg total) by mouth every 4 (four) hours as  needed for moderate pain. (Patient not taking: Reported on 11/06/2019), Disp: 30 tablet, Rfl: 0 polyethylene glycol (MIRALAX / GLYCOLAX) 17 g packet, Take 17 g by mouth daily., Disp: , Rfl:  promethazine (PHENERGAN) 25 MG tablet, Take 1 tablet (25 mg total) by mouth every 6 (six) hours as needed for nausea., Disp: 30 tablet, Rfl: 1  No current facility-administered medications for this visit.    Blood pressure 113/81, pulse 91, height 5\' 1"  (1.549 m), weight 162 lb (73.5 kg), last menstrual period 09/27/2019.  Physical Exam: Incision has healed completely +levator ani tesnion Cuff is intact well healed  Diagnostic Tests:   Pathology: benign  Impression: S/P TAH salpingectomy  Plan: Introduced LA/vaginismus concept to patient  Follow up: 1  month for LA exercises  09/29/2019, MD

## 2019-11-23 MED FILL — AIMOVIG 70 MG/ML SOAJ: 70 | 30 days supply | Qty: 1 | Fill #3

## 2019-12-03 MED FILL — buPROPion HCL ER (XL) 150 M: 150 | 90 days supply | Qty: 90 | Fill #0

## 2019-12-06 ENCOUNTER — Encounter: Payer: No Typology Code available for payment source | Admitting: Obstetrics & Gynecology

## 2019-12-14 ENCOUNTER — Other Ambulatory Visit: Payer: Self-pay | Admitting: *Deleted

## 2019-12-14 ENCOUNTER — Encounter: Payer: Self-pay | Admitting: Obstetrics & Gynecology

## 2019-12-14 ENCOUNTER — Ambulatory Visit (INDEPENDENT_AMBULATORY_CARE_PROVIDER_SITE_OTHER): Payer: No Typology Code available for payment source | Admitting: Obstetrics & Gynecology

## 2019-12-14 VITALS — BP 126/83 | HR 82 | Wt 165.0 lb

## 2019-12-14 DIAGNOSIS — N942 Vaginismus: Secondary | ICD-10-CM

## 2019-12-14 DIAGNOSIS — Z90710 Acquired absence of both cervix and uterus: Secondary | ICD-10-CM

## 2019-12-14 NOTE — Progress Notes (Signed)
  HPI: Patient returns for routine postoperative follow-up having undergone TAH BS on 10/03/19 .  The patient's immediate postoperative recovery has been unremarkable. Since hospital discharge the patient reports doing well.   Current Outpatient Medications: buPROPion (WELLBUTRIN SR) 150 MG 12 hr tablet, Take 150 mg by mouth daily with supper. , Disp: , Rfl:  Erenumab-aooe (AIMOVIG [place]), Inject 1 Dose into the skin every 30 (thirty) days., Disp: , Rfl:  esomeprazole (NEXIUM) 20 MG capsule, Take 20 mg by mouth daily before breakfast. , Disp: , Rfl:  JUBLIA 10 % SOLN, Apply 1 application topically 4 (four) times a week. 48 week course starting on 04/18/2019, Disp: , Rfl:  metoprolol succinate (TOPROL-XL) 25 MG 24 hr tablet, Take 25 mg by mouth daily., Disp: , Rfl:  polyethylene glycol (MIRALAX / GLYCOLAX) 17 g packet, Take 17 g by mouth daily., Disp: , Rfl:  cyclobenzaprine (FLEXERIL) 10 MG tablet, Take 10 mg by mouth every 8 (eight) hours as needed for muscle spasms (muscle pain.).  (Patient not taking: Reported on 11/06/2019), Disp: , Rfl:  docusate sodium (COLACE) 100 MG capsule, Take 1 capsule (100 mg total) by mouth 2 (two) times daily. (Patient not taking: Reported on 11/06/2019), Disp: 60 capsule, Rfl: 1 ondansetron (ZOFRAN) 4 MG tablet, Take 4 mg by mouth every 8 (eight) hours as needed for nausea or vomiting. (Patient not taking: Reported on 11/06/2019), Disp: , Rfl:  promethazine (PHENERGAN) 25 MG tablet, Take 1 tablet (25 mg total) by mouth every 6 (six) hours as needed for nausea. (Patient not taking: Reported on 12/14/2019), Disp: 30 tablet, Rfl: 1 SUMAtriptan (IMITREX) 100 MG tablet, Take 50 mg by mouth every 2 (two) hours as needed for migraine or headache.  (Patient not taking: Reported on 12/14/2019), Disp: , Rfl:   No current facility-administered medications for this visit.    Blood pressure 126/83, pulse 82, weight 165 lb (74.8 kg), last menstrual period 09/27/2019.  Physical  Exam: Incision clean dry intat LA muscle pain continues after fatiguing with contractions  With her complexity and years of negative experiences I am recommending pelvic PT  Diagnostic Tests:   Pathology: benign  Impression: S/P TAH BS Vaginismus due to LA spasm/contraction  Plan: Refer to Physical PT  Follow up: prn    [name], MD

## 2019-12-21 MED FILL — AIMOVIG 70 MG/ML SOAJ: 70 | 30 days supply | Qty: 1 | Fill #4

## 2020-01-17 MED FILL — AIMOVIG 70 MG/ML SOAJ: 70 | 30 days supply | Qty: 1 | Fill #5

## 2020-01-22 ENCOUNTER — Other Ambulatory Visit (HOSPITAL_COMMUNITY): Payer: Self-pay | Admitting: Physician Assistant

## 2020-01-22 DIAGNOSIS — R0602 Shortness of breath: Secondary | ICD-10-CM

## 2020-01-23 ENCOUNTER — Other Ambulatory Visit (HOSPITAL_COMMUNITY): Payer: Self-pay | Admitting: Physician Assistant

## 2020-01-23 ENCOUNTER — Other Ambulatory Visit: Payer: Self-pay

## 2020-01-23 ENCOUNTER — Other Ambulatory Visit: Payer: Self-pay | Admitting: Physician Assistant

## 2020-01-23 ENCOUNTER — Ambulatory Visit (HOSPITAL_COMMUNITY)
Admission: RE | Admit: 2020-01-23 | Discharge: 2020-01-23 | Disposition: A | Discharge status: Home / Self Care | Payer: No Typology Code available for payment source | Source: Ambulatory Visit | Attending: Physician Assistant | Admitting: Physician Assistant

## 2020-01-23 DIAGNOSIS — R0602 Shortness of breath: Secondary | ICD-10-CM | POA: Insufficient documentation

## 2020-01-23 DIAGNOSIS — R079 Chest pain, unspecified: Secondary | ICD-10-CM | POA: Diagnosis present

## 2020-01-23 MED ORDER — IOHEXOL 350 MG/ML SOLN
100.0000 mL | Freq: Once | INTRAVENOUS | Status: AC | PRN
  Administered 2020-01-23: 100 mL via INTRAVENOUS

## 2020-02-06 ENCOUNTER — Other Ambulatory Visit (HOSPITAL_COMMUNITY): Payer: Self-pay | Admitting: Family Medicine

## 2020-02-06 MED FILL — SUMAtriptan SUCCINATE 100 M: 100 | 30 days supply | Qty: 9 | Fill #0

## 2020-02-06 MED FILL — METOPROLOL SUCCINATE ER 25: 25 | 90 days supply | Qty: 90 | Fill #1

## 2020-02-11 MED FILL — AIMOVIG 70 MG/ML SOAJ: 70 | 30 days supply | Qty: 1 | Fill #6

## 2020-02-23 ENCOUNTER — Other Ambulatory Visit (HOSPITAL_COMMUNITY): Payer: Self-pay

## 2020-02-25 MED FILL — PREVIDENT 5000 SENSITIVE PA: 1.1-5 | 30 days supply | Qty: 100 | Fill #0

## 2020-02-26 ENCOUNTER — Other Ambulatory Visit (HOSPITAL_COMMUNITY): Payer: Self-pay

## 2020-02-26 MED FILL — CLINDAMYCIN HCL 150 MG CAPS: 150 | 10 days supply | Qty: 40 | Fill #0

## 2020-02-26 MED FILL — IBUPROFEN 800 MG TABS: 800 | 4 days supply | Qty: 16 | Fill #0

## 2020-03-03 MED FILL — buPROPion HCL ER (XL) 150 M: 150 | 90 days supply | Qty: 90 | Fill #1

## 2020-03-07 MED FILL — AIMOVIG 70 MG/ML SOAJ: 70 | 30 days supply | Qty: 1 | Fill #7

## 2020-03-25 ENCOUNTER — Other Ambulatory Visit: Payer: Self-pay | Admitting: Obstetrics and Gynecology

## 2020-03-25 ENCOUNTER — Other Ambulatory Visit (HOSPITAL_COMMUNITY): Payer: Self-pay | Admitting: Family Medicine

## 2020-03-25 MED FILL — ONDANSETRON ODT 4 MG TABLET: 4 | 5 days supply | Qty: 20 | Fill #0

## 2020-04-02 ENCOUNTER — Encounter: Payer: Self-pay | Admitting: *Deleted

## 2020-04-02 MED FILL — AIMOVIG 70 MG/ML SOAJ: 70 | 30 days supply | Qty: 1 | Fill #8

## 2020-04-03 ENCOUNTER — Ambulatory Visit: Payer: No Typology Code available for payment source | Admitting: Cardiology

## 2020-04-03 ENCOUNTER — Encounter: Payer: Self-pay | Admitting: Cardiology

## 2020-04-03 ENCOUNTER — Ambulatory Visit (INDEPENDENT_AMBULATORY_CARE_PROVIDER_SITE_OTHER): Payer: No Typology Code available for payment source

## 2020-04-03 ENCOUNTER — Other Ambulatory Visit: Payer: Self-pay | Admitting: Cardiology

## 2020-04-03 VITALS — BP 112/78 | HR 68 | Ht 60.0 in | Wt 159.0 lb

## 2020-04-03 DIAGNOSIS — Z136 Encounter for screening for cardiovascular disorders: Secondary | ICD-10-CM | POA: Diagnosis not present

## 2020-04-03 DIAGNOSIS — R002 Palpitations: Secondary | ICD-10-CM

## 2020-04-03 DIAGNOSIS — R000 Tachycardia, unspecified: Secondary | ICD-10-CM | POA: Diagnosis not present

## 2020-04-03 NOTE — Progress Notes (Signed)
Clinical Summary Ms. Anna Willis is a 39 y.o.female seen as a new patient for the following medical problems.    1. Chest pain/SOB - pcp workup with elevated Ddimer, sent for CTA 01/2020 CTA negative for PE -   - right after 2nd pfizer shot had some SOB and chest pains.  - about 1 week later. Left sided pain into shoulder, sharp pain. Constant pain for hours at a time, worst with pushing up on that arm. Not positional. Chronic anxiety that was increased.  - 2-3/10 in severity. Lasted about 2 weeks. - SOB has is also improved - had some DOE with walking upstairs that is constant about 2 flights of stairs, particularly with masks.    CAD risk factors: remote smoke x 10 years, maternal grandafather MI age 105,afib in family members.    2. Neurofibromatosis    3. Tachycardia - reports long history of tachcyarida - reports HRs with jogging up to mid 200s.  -wears apple watch, resting HRs 90s.  - can have some palpitations.  - sometimes can be triggered by caffeine with her excedrin. - chronic history of anxiety - has been on toprol x 1 year.      Just graduated nursing school.  Past Medical History:  Diagnosis Date  . Acid reflux   . Anxiety   . Complication of anesthesia    muscle weakness after surgery     . Migraine   . Neurofibromatosis (HCC)   . Scoliosis   . Seasonal allergies   . Tachycardia      Allergies  Allergen Reactions  . Amoxicillin Nausea And Vomiting    Projectile vomiting  . Rocephin [Ceftriaxone] Nausea And Vomiting  . Succinylcholine Other (See Comments)    Muscle weakness/muscle pain.  . Erythromycin Rash     Current Outpatient Medications  Medication Sig Dispense Refill  . buPROPion (WELLBUTRIN SR) 150 MG 12 hr tablet Take 150 mg by mouth daily with supper.     Dorise Hiss (AIMOVIG [place]) Inject 1 Dose into the skin every 30 (thirty) days.    Marland Kitchen esomeprazole (NEXIUM) 20 MG capsule Take 20 mg by mouth daily before breakfast.     .  JUBLIA 10 % SOLN Apply 1 application topically 4 (four) times a week. 48 week course starting on 04/18/2019    . metoprolol succinate (TOPROL-XL) 25 MG 24 hr tablet Take 25 mg by mouth daily.    . SUMAtriptan (IMITREX) 100 MG tablet Take 50 mg by mouth every 2 (two) hours as needed for migraine or headache.  (Patient not taking: Reported on 12/14/2019)     No current facility-administered medications for this visit.     Past Surgical History:  Procedure Laterality Date  . ABDOMINAL HYSTERECTOMY  10/03/2019   Procedure: TOTAL ABDOMINAL HYSTERECTOMY;  Surgeon: [name], MD;  Location: [facility];  Service: Gynecology;;  . BILATERAL SALPINGECTOMY  10/03/2019   Procedure: OPEN BILATERAL SALPINGECTOMY;  Surgeon: [name], MD;  Location: [facility];  Service: Gynecology;;  . DILATION AND CURETTAGE OF UTERUS    . LAPAROSCOPIC APPENDECTOMY N/A 07/18/2019   Procedure: APPENDECTOMY LAPAROSCOPIC;  Surgeon: Lucretia Roers, MD;  Location: [facility];  Service: General;  Laterality: N/A;  . TOE AMPUTATION     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   . WISDOM TOOTH EXTRACTION       Allergies  Allergen Reactions  . Amoxicillin Nausea And Vomiting    Projectile vomiting  . Rocephin [  Ceftriaxone] Nausea And Vomiting  . Succinylcholine Other (See Comments)    Muscle weakness/muscle pain.  . Erythromycin Rash      Family History  Problem Relation Age of Onset  . Migraines Mother   . Epilepsy Mother   . Endometriosis Mother        had hyst at age 60  . Neurofibromatosis Father   . Dementia Maternal Grandmother   . Kidney disease Maternal Grandmother   . Other Maternal Grandmother        shingles  . Heart attack Maternal Grandfather   . Breast cancer Paternal Aunt   . Breast cancer Paternal Aunt   . Breast cancer Paternal Aunt      Social History Anna Willis reports that she has quit smoking. Her smoking use included cigarettes. She quit after 10.00 years of use. She has never used smokeless  tobacco. Anna Willis reports no history of alcohol use.   Review of Systems CONSTITUTIONAL: No weight loss, fever, chills, weakness or fatigue.  HEENT: Eyes: No visual loss, blurred vision, double vision or yellow sclerae.No hearing loss, sneezing, congestion, runny nose or sore throat.  SKIN: No rash or itching.  CARDIOVASCULAR: per hpi RESPIRATORY: No shortness of breath, cough or sputum.  GASTROINTESTINAL: No anorexia, nausea, vomiting or diarrhea. No abdominal pain or blood.  GENITOURINARY: No burning on urination, no polyuria NEUROLOGICAL: No headache, dizziness, syncope, paralysis, ataxia, numbness or tingling in the extremities. No change in bowel or bladder control.  MUSCULOSKELETAL: No muscle, back pain, joint pain or stiffness.  LYMPHATICS: No enlarged nodes. No history of splenectomy.  PSYCHIATRIC: No history of depression or anxiety.  ENDOCRINOLOGIC: No reports of sweating, cold or heat intolerance. No polyuria or polydipsia.  Marland Kitchen   Physical Examination Today's Vitals   04/03/20 1459  BP: 112/78  Pulse: 68  SpO2: 98%  Weight: 159 lb (72.1 kg)  Height: 5' (1.524 m)   Body mass index is 31.05 kg/m.  Gen: resting comfortably, no acute distress HEENT: no scleral icterus, pupils equal round and reactive, no palptable cervical adenopathy,  CV: RRR, no m/r/g, no jvd Resp: Clear to auscultation bilaterally GI: abdomen is soft, non-tender, non-distended, normal bowel sounds, no hepatosplenomegaly MSK: extremities are warm, no edema.  Skin: warm, no rash Neuro:  no focal deficits Psych: appropriate affect   Diagnostic Studies     Assessment and Plan  1.  Heart palpitations/Tachycardia - we will obtain a 14 day zio patch to further evaluate      Antoine Poche, M.D.

## 2020-04-03 NOTE — Patient Instructions (Addendum)
Your physician recommends that you schedule a follow-up appointment in: 6 WEEKS   Your physician recommends that you continue on your current medications as directed. Please refer to the Current Medication list given to you today.  14 ZIO PATCH   Thank you for choosing [facility]!!

## 2020-05-06 ENCOUNTER — Other Ambulatory Visit (HOSPITAL_COMMUNITY): Payer: Self-pay | Admitting: Family Medicine

## 2020-05-06 MED FILL — METOPROLOL SUCCINATE ER 25: 25 | 90 days supply | Qty: 90 | Fill #0

## 2020-05-06 MED FILL — AIMOVIG 70 MG/ML SOAJ: 70 | 30 days supply | Qty: 1 | Fill #9

## 2020-05-16 ENCOUNTER — Encounter: Payer: Self-pay | Admitting: *Deleted

## 2020-05-16 ENCOUNTER — Other Ambulatory Visit: Payer: Self-pay | Admitting: Student

## 2020-05-16 ENCOUNTER — Encounter: Payer: Self-pay | Admitting: Student

## 2020-05-16 ENCOUNTER — Ambulatory Visit (INDEPENDENT_AMBULATORY_CARE_PROVIDER_SITE_OTHER): Payer: No Typology Code available for payment source | Admitting: Student

## 2020-05-16 ENCOUNTER — Other Ambulatory Visit: Payer: Self-pay

## 2020-05-16 ENCOUNTER — Telehealth: Payer: Self-pay | Admitting: *Deleted

## 2020-05-16 VITALS — BP 112/70 | HR 86 | Ht 61.0 in | Wt 161.0 lb

## 2020-05-16 DIAGNOSIS — R002 Palpitations: Secondary | ICD-10-CM | POA: Diagnosis not present

## 2020-05-16 DIAGNOSIS — R072 Precordial pain: Secondary | ICD-10-CM | POA: Diagnosis not present

## 2020-05-16 MED ORDER — METOPROLOL SUCCINATE ER 25 MG PO TB24
37.5000 mg | ORAL_TABLET | Freq: Every day | ORAL | 3 refills | Status: DC
Start: 2020-05-16 — End: 2020-05-16

## 2020-05-16 NOTE — Telephone Encounter (Signed)
Pt made aware at appointment today with Randall An, PA

## 2020-05-16 NOTE — Telephone Encounter (Signed)
-----   Message from Antoine Poche, MD sent at 05/13/2020 12:45 PM EST ----- Heart monitor shows just some rare extra heart beats, no concerning findings. Discussed further at her upcoming appt with PA  Dominga Ferry MD

## 2020-05-16 NOTE — Patient Instructions (Signed)
Medication Instructions:  Your physician has recommended you make the following change in your medication:   Increase Toprol XL to 37.5 mg Daily   *If you need a refill on your cardiac medications before your next appointment, please call your pharmacy*   Lab Work: None   If you have labs (blood work) drawn today and your tests are completely normal, you will receive your results only by: Marland Kitchen MyChart Message (if you have MyChart) OR . A paper copy in the mail If you have any lab test that is abnormal or we need to change your treatment, we will call you to review the results.   Testing/Procedures: None   Follow-Up: At [facility], you and your health needs are our priority.  As part of our continuing mission to provide you with exceptional heart care, we have created designated Provider Care Teams.  These Care Teams include your primary Cardiologist (physician) and Advanced Practice Providers (APPs -  Physician Assistants and Nurse Practitioners) who all work together to provide you with the care you need, when you need it.  We recommend signing up for the patient portal called "MyChart".  Sign up information is provided on this After Visit Summary.  MyChart is used to connect with patients for Virtual Visits (Telemedicine).  Patients are able to view lab/test results, encounter notes, upcoming appointments, etc.  Non-urgent messages can be sent to your provider as well.   To learn more about what you can do with MyChart, go to [email or web address].    Your next appointment:   6 month(s)  The format for your next appointment:   In Person  Provider:   Dina Rich, MD or Randall An, PA-C   Other Instructions Thank you for choosing [facility]!

## 2020-05-16 NOTE — Progress Notes (Signed)
Cardiology Office Note    Date:  05/16/2020   ID:  Anna Willis, DOB 08-Mar-1981, MRN [id number]  PCP:  [facility]  Cardiologist: Dina Rich, MD    Chief Complaint  Patient presents with  . Follow-up    6 week visit    History of Present Illness:    Anna Willis is a 40 y.o. female with past medical history of neurofibromatosis, tachycardia, palpitations and history of tobacco use who presents to the office today for 6-week follow-up.  She was last examined by Dr. Wyline Mood on 04/03/2020 as a new patient referral for episodes of chest pain and dyspnea. Reported symptoms initially occurred after she received her second Covid vaccination. Reported her chest pain could last for hours at a time and was not worse with exertion. She also reported intermittent palpitations and had noticed worsening symptoms after consuming caffeine or taking Excedrin. A 14-day Zio patch was recommended for further evaluation. This showed predominantly normal sinus rhythm with rare PAC's and PVC's but no significant arrhythmias.  In talking with the patient today, she reports still having frequent palpitations and notices this is typically worse with stress. 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 She has reduced her caffeine intake and does not consume alcohol. She reports her episodes of chest discomfort have improved. No recent dyspnea on exertion, orthopnea or PND.    Past Medical History:  Diagnosis Date  . Acid reflux   . Anxiety   . Complication of anesthesia    muscle weakness after surgery     . Migraine   . Neurofibromatosis (HCC)   . Scoliosis   . Seasonal allergies   . Tachycardia     Past Surgical History:  Procedure Laterality Date  . ABDOMINAL HYSTERECTOMY  10/03/2019   Procedure: TOTAL ABDOMINAL HYSTERECTOMY;  Surgeon: [name], MD;  Location: [facility];  Service: Gynecology;;  .  BILATERAL SALPINGECTOMY  10/03/2019   Procedure: OPEN BILATERAL SALPINGECTOMY;  Surgeon: [name], MD;  Location: [facility];  Service: Gynecology;;  . DILATION AND CURETTAGE OF UTERUS    . LAPAROSCOPIC APPENDECTOMY N/A 07/18/2019   Procedure: APPENDECTOMY LAPAROSCOPIC;  Surgeon: Lucretia Roers, MD;  Location: [facility];  Service: General;  Laterality: N/A;  . TOE AMPUTATION     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   . WISDOM TOOTH EXTRACTION      Current Medications: Outpatient Medications Prior to Visit  Medication Sig Dispense Refill  . buPROPion (WELLBUTRIN SR) 150 MG 12 hr tablet Take 150 mg by mouth daily with supper.     Dorise Hiss (AIMOVIG [place]) Inject 1 Dose into the skin every 30 (thirty) days.    Marland Kitchen esomeprazole (NEXIUM) 20 MG capsule Take 20 mg by mouth daily before breakfast.     . SUMAtriptan (IMITREX) 100 MG tablet Take 50 mg by mouth every 2 (two) hours as needed for migraine or headache.    . metoprolol succinate (TOPROL-XL) 25 MG 24 hr tablet Take 25 mg by mouth daily.     No facility-administered medications prior to visit.     Allergies:   Amoxicillin, Rocephin [ceftriaxone], Succinylcholine, and Erythromycin   Social History   Socioeconomic History  . Marital status: Married    Spouse name: Not on file  . Number of children: 0  . Years of education: 46  . Highest education level: Not on file  Occupational  History  . Occupation: Hair dresser  Tobacco Use  . Smoking status: Former Smoker    Years: 10.00    Types: Cigarettes  . Smokeless tobacco: Never Used  Vaping Use  . Vaping Use: Never used  Substance and Sexual Activity  . Alcohol use: No  . Drug use: No  . Sexual activity: Yes    Birth control/protection: Surgical    Comment: has female partner  Other Topics Concern  . Not on file  Social History Narrative   Lives at home with her partner, Tyauna.   Right-handed.   0.5 cup coffee and 1 soda per day.   Social Determinants of Health    Financial Resource Strain: Not on file  Food Insecurity: Not on file  Transportation Needs: Not on file  Physical Activity: Not on file  Stress: Not on file  Social Connections: Not on file     Family History:  The patient's family history includes Breast cancer in her paternal aunt, paternal aunt, and paternal aunt; Dementia in her maternal grandmother; Endometriosis in her mother; Epilepsy in her mother; Heart attack in her maternal grandfather; Kidney disease in her maternal grandmother; Migraines in her mother; Neurofibromatosis in her father; Other in her maternal grandmother.   Review of Systems:   Please see the history of present illness.     General:  No chills, fever, night sweats or weight changes.  Cardiovascular:  No chest pain, dyspnea on exertion, edema, orthopnea, paroxysmal nocturnal dyspnea. Positive for palpitations.  Dermatological: No rash, lesions/masses Respiratory: No cough, dyspnea Urologic: No hematuria, dysuria Abdominal:   No nausea, vomiting, diarrhea, bright red blood per rectum, melena, or hematemesis Neurologic:  No visual changes, wkns, changes in mental status. All other systems reviewed and are otherwise negative except as noted above.   Physical Exam:    VS:  BP 112/70   Pulse 86   Ht 5\' 1"  (1.549 m)   Wt 161 lb (73 kg)   LMP 09/27/2019   SpO2 98%   BMI 30.42 kg/m    General: Well developed, well nourished,female appearing in no acute distress. Head: Normocephalic, atraumatic. Neck: No carotid bruits. JVD not elevated.  Lungs: Respirations regular and unlabored, without wheezes or rales.  Heart: Regular rate and rhythm. No S3 or S4.  No murmur, no rubs, or gallops appreciated. Abdomen: Appears non-distended. No obvious abdominal masses. Msk:  Strength and tone appear normal for age. No obvious joint deformities or effusions. Extremities: No clubbing or cyanosis. No pitting edema.  Distal pedal pulses are 2+ bilaterally. Neuro: Alert and  oriented X 3. Moves all extremities spontaneously. No focal deficits noted. Psych:  Responds to questions appropriately with a normal affect. Skin: No rashes or lesions noted  Wt Readings from Last 3 Encounters:  05/16/20 161 lb (73 kg)  04/03/20 159 lb (72.1 kg)  12/14/19 165 lb (74.8 kg)     Studies/Labs Reviewed:   EKG:  EKG is not ordered today.    Recent Labs: 07/19/2019: Magnesium 2.2 09/27/2019: ALT 23 10/04/2019: BUN 10; Creatinine, Ser 0.59; Hemoglobin 11.8; Platelets 280; Potassium 4.1; Sodium 134   Lipid Panel No results found for: CHOL, TRIG, HDL, CHOLHDL, VLDL, LDLCALC, LDLDIRECT  Additional studies/ records that were reviewed today include:   Event Monitor: 04/18/2020  Predominant rhythm was normal sinus rhythm  Rare supreventricular and ventricular ectopy  No symptoms reported  No significant arrhythmias    Assessment:    1. Heart palpitations   2. Precordial pain  Plan:   In order of problems listed above:  1. Palpitations - Recent monitor showed rare PAC's and PVC's but no significant arrhythmias. Reviewed the report in detail with the patient today. Given she continues to have palpitations, will attempt to titrate Toprol-XL from 25mg  daily to 37.5mg  daily if BP allows. If unable to tolerate, would reduce to 25mg  daily and she can take an extra 12.5mg  as needed. Continued reduction in caffeine advised.   2. History of Chest Pain - Occurred after her COVID vaccination but has now improved. If she has any recurrent symptoms, would recommend an echocardiogram for evaluation. Prior CTA in 01/2020 at the time of symptoms showed no evidence of a pericardial effusion.    Medication Adjustments/Labs and Tests Ordered: Current medicines are reviewed at length with the patient today.  Concerns regarding medicines are outlined above.  Medication changes, Labs and Tests ordered today are listed in the Patient Instructions below. Patient Instructions   Medication Instructions:  Your physician has recommended you make the following change in your medication:   Increase Toprol XL to 37.5 mg Daily   *If you need a refill on your cardiac medications before your next appointment, please call your pharmacy*   Lab Work: None   If you have labs (blood work) drawn today and your tests are completely normal, you will receive your results only by: MyChart Message (if you have MyChart) OR . A paper copy in the mail If you have any lab test that is abnormal or we need to change your treatment, we will call you to review the results.   Testing/Procedures: None   Follow-Up: At [facility], you and your health needs are our priority.  As part of our continuing mission to provide you with exceptional heart care, we have created designated Provider Care Teams.  These Care Teams include your primary Cardiologist (physician) and Advanced Practice Providers (APPs -  Physician Assistants and Nurse Practitioners) who all work together to provide you with the care you need, when you need it.  We recommend signing up for the patient portal called "MyChart".  Sign up information is provided on this After Visit Summary.  MyChart is used to connect with patients for Virtual Visits (Telemedicine).  Patients are able to view lab/test results, encounter notes, upcoming appointments, etc.  Non-urgent messages can be sent to your provider as well.   To learn more about what you can do with MyChart, go to Marland Kitchen.    Your next appointment:   6 month(s)  The format for your next appointment:   In Person  Provider:   [facility], MD or [email or web address], PA-C   Other Instructions Thank you for choosing [facility]!       Signed, Dina Rich, PA-C  05/16/2020 7:34 PM    [facility] [address] [place], [address] [place] Phone: [phone] Fax: [phone]

## 2020-05-28 MED FILL — SUMAtriptan SUCCINATE 100 M: 100 | 30 days supply | Qty: 9 | Fill #1

## 2020-06-02 MED FILL — AIMOVIG 70 MG/ML SOAJ: 70 | 30 days supply | Qty: 1 | Fill #10

## 2020-06-03 ENCOUNTER — Encounter: Payer: No Typology Code available for payment source | Attending: Obstetrics & Gynecology | Admitting: Physical Therapy

## 2020-06-03 ENCOUNTER — Other Ambulatory Visit: Payer: Self-pay

## 2020-06-03 ENCOUNTER — Other Ambulatory Visit (HOSPITAL_COMMUNITY): Payer: Self-pay | Admitting: Family Medicine

## 2020-06-03 ENCOUNTER — Encounter: Payer: Self-pay | Admitting: Physical Therapy

## 2020-06-03 DIAGNOSIS — M545 Low back pain, unspecified: Secondary | ICD-10-CM | POA: Insufficient documentation

## 2020-06-03 DIAGNOSIS — R278 Other lack of coordination: Secondary | ICD-10-CM | POA: Diagnosis present

## 2020-06-03 DIAGNOSIS — M6281 Muscle weakness (generalized): Secondary | ICD-10-CM | POA: Insufficient documentation

## 2020-06-03 DIAGNOSIS — R252 Cramp and spasm: Secondary | ICD-10-CM | POA: Insufficient documentation

## 2020-06-03 MED FILL — buPROPion HCL ER (XL) 150 M: 150 | 90 days supply | Qty: 90 | Fill #0

## 2020-06-03 NOTE — Patient Instructions (Addendum)
STRETCHING THE PELVIC FLOOR MUSCLES NO DILATOR  Supplies . Vaginal lubricant . Mirror (optional) . Gloves (optional) or clean hands Positioning . Start in a semi-reclined position with your head propped up. Bend your knees and place your thumb or finger at the vaginal opening. Procedure . Apply a moderate amount of lubricant on the outer skin of your vagina, the labia minora.  Apply additional lubricant to your finger. Marland Kitchen Spread the skin away from the vaginal opening. Place the end of your finger at the opening. . Do a maximum contraction of the pelvic floor muscles. Tighten the vagina and the anus maximally and relax. . When you know they are relaxed, gently and slowly insert your finger into your vagina, directing your finger slightly downward, for 2-3 inches of insertion. . Relax and stretch the 6 o'clock position . Hold each stretch for _30-60 seconds, no pain more than 3/10 . Repeat the stretching in the 4 o'clock and 8 o'clock positions. . Next gently move your finger in a "U" shape  several times.  . You can also enter a second finger to work to spread the vaginal opening wider from 3:00-6:00 and 6:00-9:00 or 3:00-9:00 . Perform daily or every other day . Then do 20 strokes from the 1:00 to 6:00 then 11:00 to 6:00  Eulis Foster, PT [facility] [address] [place], [place] [address] W: [phone] [email or web address]

## 2020-06-03 NOTE — Therapy (Signed)
[facility]patient Rehabilitation at [facility] [address], [address] [place], [place], [address] Phone: [phone]   Fax:  [phone]  Physical Therapy Evaluation  Patient Details  Name: Anna Willis MRN: [id number] Date of Birth: Jan 17, 1981 Referring Provider (PT): Dr. Duane Lope   Encounter Date: 06/03/2020   PT End of Session - 06/03/20 0909    Visit Number 1    Date for PT Re-Evaluation 08/26/20    Authorization Type [employer]    PT Start Time 0830    PT Stop Time 0929    PT Time Calculation (min) 59 min    Activity Tolerance Patient tolerated treatment well;No increased pain    Behavior During Therapy WFL for tasks assessed/performed           Past Medical History:  Diagnosis Date  . Acid reflux   . Anxiety   . Complication of anesthesia    muscle weakness after surgery     . Migraine   . Neurofibromatosis (HCC)   . Scoliosis   . Seasonal allergies   . Tachycardia     Past Surgical History:  Procedure Laterality Date  . ABDOMINAL HYSTERECTOMY  10/03/2019   Procedure: TOTAL ABDOMINAL HYSTERECTOMY;  Surgeon: [name], MD;  Location: [facility];  Service: Gynecology;;  . BILATERAL SALPINGECTOMY  10/03/2019   Procedure: OPEN BILATERAL SALPINGECTOMY;  Surgeon: [name], MD;  Location: [facility];  Service: Gynecology;;  . DILATION AND CURETTAGE OF UTERUS    . LAPAROSCOPIC APPENDECTOMY N/A 07/18/2019   Procedure: APPENDECTOMY LAPAROSCOPIC;  Surgeon: Lucretia Roers, MD;  Location: [facility];  Service: General;  Laterality: N/A;  . TOE AMPUTATION     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   . WISDOM TOOTH EXTRACTION      There were no vitals filed for this visit.    Subjective Assessment - 06/03/20 0835    Subjective Patient reports intercourse has always been painful her whole life. In 2016 did fertility treatments that were not successful. Patient had extreme cycles with extreme bleeding. Patient clenches with intercourse.  vaginal exam are painful.    Patient Stated Goals Be able to have a healthy sex life,    Currently in Pain? Yes    Pain Score 8     Pain Location Vagina    Pain Orientation Mid    Pain Descriptors / Indicators Cramping    Pain Type Chronic pain    Pain Onset More than a month ago    Pain Frequency Intermittent    Aggravating Factors  vaginal penetration    Pain Relieving Factors no vaginal penetration    Multiple Pain Sites Yes    Pain Score 6    Pain Location Back    Pain Orientation Lower    Pain Descriptors / Indicators Sharp;Aching    Pain Type Chronic pain    Pain Onset More than a month ago    Pain Frequency Intermittent    Aggravating Factors  movement, end of work day, twisting    Pain Relieving Factors not move              [facility] PT Assessment - 06/03/20 0001      Assessment   Medical Diagnosis Z90.710 S/P hysterectomy; N94.2 Vaginismus    Referring Provider (PT) Dr. Duane Lope    Onset Date/Surgical Date --   10/03/2019   Prior Therapy none      Precautions   Precautions None  Restrictions   Weight Bearing Restrictions No      Balance Screen   Has the patient fallen in the past 6 months No    Has the patient had a decrease in activity level because of a fear of falling?  No    Is the patient reluctant to leave their home because of a fear of falling?  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      Cognition   Overall Cognitive Status Within Functional Limits for tasks assessed      Observation/Other Assessments   Skin Integrity decreased scar mobility suprapubically      Posture/Postural Control   Posture/Postural Control No significant limitations      ROM / Strength   AROM / PROM / Strength AROM;PROM;Strength      AROM   Lumbar Extension decreased by 50%    Lumbar - Left Rotation decreased by  25%      Strength   Right Hip ABduction 4/5    Left Hip ABduction 4+/5      Palpation   Spinal mobility decreased movement of L2-L5 with pain    SI assessment  right ilium rotated anteriorly    Palpation comment tenderness located in lower abdominal;, lumbar paraspinals                      Objective measurements completed on examination: See above findings.     Pelvic Floor Special Questions - 06/03/20 0001    Prior Pregnancies No    Urinary Leakage Yes    Pad use 0    Activities that cause leaking Other    Other activities that cause leaking jumping    Urinary frequency when urinate feel the bladder pulls    Fecal incontinence No   constipation, stool softner, miralax   Skin Integrity Intact    Perineal Body/Introitus  Other    Perineal Body/Introitus other tight    Pelvic Floor Internal Exam Patient confirms identification and approves PT to assess pelvic floor muscles and treatment    Exam Type Vaginal    Palpation able to tolerate a Q-tip into the vaginal canal, The therapist was able to place her index finger into the vaginal canal, tightness in the bulbocavernosus, levator ani, obturator internist, urethra sphinter    Strength good squeeze, good lift, able to hold agaisnt strong resistance            [facility] Adult PT Treatment/Exercise - 06/03/20 0001      Self-Care   Self-Care Other Self-Care Comments    Other Self-Care Comments  educated patient on how to perform perineal massage to the intoriuts and perineal body                  PT Education - 06/03/20 0931    Education Details education on how to perform perineal massage to the perineal and introitus, gave patient samples of vaginal lubricant    Person(s) Educated Patient    Methods Explanation;Demonstration;Verbal cues;Handout    Comprehension Verbalized understanding            PT Short Term Goals - 06/03/20 1153      PT SHORT TERM GOAL #1   Title independent with initial HEP  for stretches    Time 4  Period Weeks    Status New    Target Date 07/01/20      PT SHORT TERM GOAL #2   Title understand how to perfrom perineal manual work to elongate the tissue to reduce pain    Time 4    Period Weeks    Status New    Target Date 07/01/20      PT SHORT TERM GOAL #3   Title ability to bulge the pelvic floor to elongate the muscles and relax them    Time 4    Period Days    Status New    Target Date 07/01/20             PT Long Term Goals - 06/03/20 1154      PT LONG TERM GOAL #1   Title independent with advanced HEP for strengthening    Time 12    Period Weeks    Status New    Target Date 08/26/20      PT LONG TERM GOAL #2   Title able to have vaginal penetration with 0-1/10 pain due to elongation of the tissue and muscles    Time 12    Period Weeks    Status New    Target Date 08/26/20      PT LONG TERM GOAL #3   Title able to relax the pelvic floor when she clenches to reduce pain    Time 12    Period Weeks    Status New    Target Date 08/26/20      PT LONG TERM GOAL #4   Title able to perform daily activities with lumbar pain decreased >/= 75% due to improve mobility and strength    Time 12    Period Weeks    Status New    Target Date 08/26/20                  Plan - 06/03/20 0933    Clinical Impression Statement Patient is 40 year old female with diagnosis of Vaginismus for many years. She reports her pain level is 8/10 with vaginal penetration. Patient had a Abdominal Hysterectomy on 10/03/2019. Patient pelvic floor strength is 4/5 but difficulty with fully bulging her pelvic floor. She was able to tolerate a Q-tip and therapist index finger into the vaginal canal with no pain. Patient has tightness in the levator ani, obturator internist, bulbocavernosus, and urethra spincter. She has some fascial tightness on the sides of the bladder. Patient hysterectomy scar suprapubically is tight. Patient has tenderness located in the  lower abdominal and lumbar area. Her right ilium is rotated anteriorly. Her L2-L5 vertebrae is decreased movement and painful.           Patient will benefit from skilled therapeutic intervention in order to improve the following deficits and impairments:     Visit Diagnosis: Muscle weakness (generalized) - Plan: PT plan of care cert/re-cert  Cramp and spasm - Plan: PT plan of care cert/re-cert  Other lack of coordination - Plan: PT plan of care cert/re-cert  Acute low back pain without sciatica, unspecified back pain laterality - Plan: PT plan of care cert/re-cert     Problem List Patient Active Problem List   Diagnosis Date Noted  . S/P hysterectomy 10/03/2019  . Acute appendicitis   . Abdominal pain 07/17/2019  . Neurofibromatosis syndrome (HCC) 01/24/2019  . Obesity with body mass index 30 or greater 01/24/2019  . Menorrhagia 11/13/2018  . Dysmenorrhea 03/24/2018  Eulis Foster, PT 06/03/20 12:00 PM   [facility] Outpatient Rehabilitation at [facility] [address] [place], [place], [address] Phone: [phone]   Fax:  [phone]  Name: Chardonay Scritchfield MRN: [id number] Date of Birth: July 02, 1980

## 2020-06-10 ENCOUNTER — Encounter: Payer: No Typology Code available for payment source | Admitting: Physical Therapy

## 2020-06-17 ENCOUNTER — Encounter: Payer: No Typology Code available for payment source | Admitting: Physical Therapy

## 2020-06-24 ENCOUNTER — Encounter: Payer: Self-pay | Admitting: Physical Therapy

## 2020-06-24 ENCOUNTER — Other Ambulatory Visit: Payer: Self-pay

## 2020-06-24 ENCOUNTER — Encounter: Payer: No Typology Code available for payment source | Attending: Obstetrics & Gynecology | Admitting: Physical Therapy

## 2020-06-24 DIAGNOSIS — M545 Low back pain, unspecified: Secondary | ICD-10-CM | POA: Insufficient documentation

## 2020-06-24 DIAGNOSIS — R278 Other lack of coordination: Secondary | ICD-10-CM | POA: Diagnosis present

## 2020-06-24 DIAGNOSIS — M6281 Muscle weakness (generalized): Secondary | ICD-10-CM | POA: Diagnosis present

## 2020-06-24 DIAGNOSIS — R252 Cramp and spasm: Secondary | ICD-10-CM | POA: Insufficient documentation

## 2020-06-24 NOTE — Addendum Note (Signed)
Addended by: Eulis Foster F on: 06/24/2020 10:47 AM   Modules accepted: Orders

## 2020-06-24 NOTE — Therapy (Addendum)
[place] [facility] [address] [place], [place], [address] Phone: [phone]   Fax:  [phone]  Physical Therapy Treatment  Patient Details  Name: Anna Willis MRN: [id number] Date of Birth: 1980/04/24 Referring Provider (PT): Dr. Tania Ade   Encounter Date: 06/24/2020   PT End of Session - 06/24/20 0921    Visit Number 2    Date for PT Re-Evaluation 08/26/20    Authorization Type [place] [employer]    PT Start Time 0830    PT Stop Time 0915    PT Time Calculation (min) 45 min    Activity Tolerance Patient tolerated treatment well;No increased pain    Behavior During Therapy WFL for tasks assessed/performed           Past Medical History:  Diagnosis Date  . Acid reflux   . Anxiety   . Complication of anesthesia    muscle weakness after surgery     . Migraine   . Neurofibromatosis ([place])   . Scoliosis   . Seasonal allergies   . Tachycardia     Past Surgical History:  Procedure Laterality Date  . ABDOMINAL HYSTERECTOMY  10/03/2019   Procedure: TOTAL ABDOMINAL HYSTERECTOMY;  Surgeon: Florian Buff, MD;  Location: [facility];  Service: Gynecology;;  . BILATERAL SALPINGECTOMY  10/03/2019   Procedure: OPEN BILATERAL SALPINGECTOMY;  Surgeon: Florian Buff, MD;  Location: [facility];  Service: Gynecology;;  . DILATION AND CURETTAGE OF UTERUS    . LAPAROSCOPIC APPENDECTOMY N/A 07/18/2019   Procedure: APPENDECTOMY LAPAROSCOPIC;  Surgeon: Virl Cagey, MD;  Location: [facility];  Service: General;  Laterality: N/A;  . TOE AMPUTATION     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   . WISDOM TOOTH EXTRACTION      There were no vitals filed for this visit.   Subjective Assessment - 06/24/20 0832    Subjective I felt good after last visit. No changes. 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    Patient Stated Goals Be able to have a healthy sex life,    Currently in Pain? Yes    Pain Score 8      Pain Location Vagina    Pain Orientation Mid    Pain Descriptors / Indicators Cramping    Pain Type Chronic pain    Pain Onset More than a month ago    Pain Frequency Intermittent    Aggravating Factors  vaginal penetration    Pain Relieving Factors no vaginal penetration    Multiple Pain Sites Yes    Pain Score 6    Pain Location Back    Pain Orientation Lower    Pain Descriptors / Indicators Aching;Sharp    Pain Type Chronic pain    Pain Onset More than a month ago    Pain Frequency Intermittent    Aggravating Factors  movement, end of work day, twisting    Pain Relieving Factors not move                             [facility] Adult PT Treatment/Exercise - 06/24/20 0001      Lumbar Exercises: Stretches   Active Hamstring Stretch Right;Left;1 rep;30 seconds    Piriformis Stretch Right;Left;1 rep;30 seconds    Other Lumbar Stretch Exercise hip adductor stretch sitting holding 30 sec each side    Other Lumbar Stretch Exercise happy baby stretch 1  minutes      Lumbar Exercises: Supine   Other Supine Lumbar Exercises diaphragmatic breathing to open up the lower rib cage, abdomen and pelvic floor      Lumbar Exercises: Quadruped   [place] Horse 10 reps      Manual Therapy   Manual Therapy Soft tissue mobilization;Myofascial release    Manual therapy comments educated patient on abdominal massage    Soft tissue mobilization circular massage to promote peristalic motion of the intestines, lower abdominal massage; scar massage on the the hysterectomy scar    Myofascial Release using a suction cup to rlelease the fasci of the lower abdomen, rlelease of the scar, tissue rolling of the lower abdomen, relese of the iliocecal valve                  PT Education - 06/24/20 0919    Education Details Access Code: [email or web address]    Person(s) Educated Patient    Methods Explanation;Demonstration;Verbal cues;Handout    Comprehension Returned demonstration;Verbalized  understanding            PT Short Term Goals - 06/03/20 1153      PT SHORT TERM GOAL #1   Title independent with initial HEP for stretches    Time 4    Period Weeks    Status New    Target Date 07/01/20      PT SHORT TERM GOAL #2   Title understand how to perfrom perineal manual work to elongate the tissue to reduce pain    Time 4    Period Weeks    Status New    Target Date 07/01/20      PT SHORT TERM GOAL #3   Title ability to bulge the pelvic floor to elongate the muscles and relax them    Time 4    Period Days    Status New    Target Date 07/01/20             PT Long Term Goals - 06/03/20 1154      PT LONG TERM GOAL #1   Title independent with advanced HEP for strengthening    Time 12    Period Weeks    Status New    Target Date 08/26/20      PT LONG TERM GOAL #2   Title able to have vaginal penetration with 0-1/10 pain due to elongation of the tissue and muscles    Time 12    Period Weeks    Status New    Target Date 08/26/20      PT LONG TERM GOAL #3   Title able to relax the pelvic floor when she clenches to reduce pain    Time 12    Period Weeks    Status New    Target Date 08/26/20      PT LONG TERM GOAL #4   Title able to perform daily activities with lumbar pain decreased >/= 75% due to improve mobility and strength    Time 12    Period Weeks    Status New    Target Date 08/26/20                 Plan - 06/24/20 5409    Clinical Impression Statement Patient will try to fit in her schedule perineal massage three times per week. Patient has increased fascial tightness in the lower abdomen.  Patient still needs verbal cues to perform diaphgram atic breathing. Patient has difficulty with bowel movements  and needs to take miralax. Patient will benefit from skilled therapy to to improve tissue mobility, elongation of the pelvic floor to have vaginal penetration and easier bowel movements.    Personal Factors and Comorbidities  Fitness;Comorbidity 3+;Time since onset of injury/illness/exacerbation;Sex;Profession    Comorbidities abdominal hysterectomy 10/03/2019; Laparoscopic appendectomy 07/18/2019; neurofibromatosis; tachycardia    Examination-Activity Limitations Continence;Toileting;Locomotion Level    Examination-Participation Restrictions Community Activity;Interpersonal Relationship;Occupation    Stability/Clinical Decision Making Stable/Uncomplicated    Rehab Potential Excellent    PT Frequency 1x / week    PT Duration 12 weeks    PT Treatment/Interventions ADLs/Self Care Home Management;Cryotherapy;Electrical Stimulation;Moist Heat;Ultrasound;Neuromuscular re-education;Therapeutic exercise;Therapeutic activities;Patient/family education;Manual techniques;Dry needling;Spinal Manipulations;Joint Manipulations    PT Next Visit Plan diaphragmatic breathing; manual work to abdomen, thighs and back; correct pelvis, lumbar mobilization    PT Home Exercise Plan Access Code: [email or web address]    Consulted and Agree with Plan of Care Patient           Patient will benefit from skilled therapeutic intervention in order to improve the following deficits and impairments:  Decreased coordination,Decreased range of motion,Increased fascial restricitons,Pain,Decreased strength,Decreased mobility,Decreased activity tolerance,Decreased endurance,Increased muscle spasms  Visit Diagnosis: Muscle weakness (generalized)  Cramp and spasm  Other lack of coordination  Acute low back pain without sciatica, unspecified back pain laterality     Problem List Patient Active Problem List   Diagnosis Date Noted  . S/P hysterectomy 10/03/2019  . Acute appendicitis   . Abdominal pain 07/17/2019  . Neurofibromatosis syndrome ([place]) 01/24/2019  . Obesity with body mass index 30 or greater 01/24/2019  . Menorrhagia 11/13/2018  . Dysmenorrhea 03/24/2018    Earlie Counts, PT 06/24/20 9:25 AM   [facility] at  [facility] [address] [place], [place], [address] Phone: [phone]   Fax:  [phone]  Name: Anna Willis MRN: [id number] Date of Birth: 1980/04/19  PHYSICAL THERAPY DISCHARGE SUMMARY  Visits from Start of Care: 2  Current functional level related to goals / functional outcomes: See above. Patient called today to report she will need to be discharged from therapy. She has a new baby from foster care and does not have time for therapy. She would like to resume therapy in the future when she has more time.    Remaining deficits: See above.   Education / Equipment: HEP Plan: Patient agrees to discharge.  Patient goals were not met. Patient is being discharged due to the patient's request. thank you for the referral. Earlie Counts, PT 08/21/20 5:20 PM   ?????

## 2020-06-24 NOTE — Patient Instructions (Signed)
Access Code: [email or web address] URL: [email or web address] Date: 06/24/2020 Prepared by: Eulis Foster  Exercises Seated Hamstring Stretch - 1 x daily - 7 x weekly - 1 sets - 2 reps - 30 sec hold Seated Piriformis Stretch with Trunk Bend - 1 x daily - 7 x weekly - 1 sets - 2 reps - 30 sec hold Seated Hip Adductor Stretch - 1 x daily - 7 x weekly - 1 sets - 2 reps - 30 sec hold Supine Pelvic Floor Stretch - 1 x daily - 7 x weekly - 1 sets - 1 reps - 1 min hold Cat Cow - 1 x daily - 7 x weekly - 1 sets - 10 reps Supine Abdominal Wall Massage - 1 x daily - 7 x weekly - 3 sets - 3 min hold Seated Diaphragmatic Breathing - 3 x daily - 7 x weekly - 1 sets - 10 reps [facility] [address], [address] [place], [place] [address] Phone # [phone] Fax [phone]

## 2020-07-02 MED FILL — AIMOVIG 70 MG/ML SOAJ: 70 | 30 days supply | Qty: 1 | Fill #11

## 2020-07-02 MED FILL — METOPROLOL SUCCINATE ER 25: 25 | 90 days supply | Qty: 135 | Fill #0

## 2020-07-08 ENCOUNTER — Encounter: Payer: No Typology Code available for payment source | Admitting: Physical Therapy

## 2020-07-30 ENCOUNTER — Encounter: Payer: No Typology Code available for payment source | Admitting: Physical Therapy

## 2020-08-04 ENCOUNTER — Encounter: Payer: No Typology Code available for payment source | Admitting: Physical Therapy

## 2020-08-07 ENCOUNTER — Other Ambulatory Visit (HOSPITAL_COMMUNITY): Payer: Self-pay

## 2020-08-07 MED ORDER — AIMOVIG 70 MG/ML ~~LOC~~ SOAJ
SUBCUTANEOUS | 3 refills | Status: DC
  Filled 2020-08-07: qty 1, 28d supply, fill #0
  Filled 2020-09-02: qty 1, 28d supply, fill #1
  Filled 2020-10-09: qty 1, 28d supply, fill #2
  Filled 2020-11-07: qty 1, 28d supply, fill #3
  Filled 2020-12-09: qty 1, 28d supply, fill #4
  Filled 2020-12-29 – 2021-01-13 (×2): qty 1, 28d supply, fill #5
  Filled 2021-02-02: qty 1, 28d supply, fill #6

## 2020-08-07 MED FILL — Sumatriptan Succinate Tab 100 MG: ORAL | 30 days supply | Qty: 9 | Fill #0 | Status: AC

## 2020-08-08 ENCOUNTER — Other Ambulatory Visit (HOSPITAL_COMMUNITY): Payer: Self-pay

## 2020-08-11 ENCOUNTER — Encounter: Payer: No Typology Code available for payment source | Admitting: Physical Therapy

## 2020-08-13 ENCOUNTER — Other Ambulatory Visit (HOSPITAL_COMMUNITY): Payer: Self-pay

## 2020-08-26 ENCOUNTER — Encounter: Payer: No Typology Code available for payment source | Admitting: Physical Therapy

## 2020-08-31 ENCOUNTER — Emergency Department (HOSPITAL_COMMUNITY): Payer: No Typology Code available for payment source

## 2020-08-31 ENCOUNTER — Encounter (HOSPITAL_COMMUNITY): Payer: Self-pay | Admitting: Emergency Medicine

## 2020-08-31 ENCOUNTER — Other Ambulatory Visit: Payer: Self-pay

## 2020-08-31 ENCOUNTER — Emergency Department (HOSPITAL_COMMUNITY)
Admission: EM | Admit: 2020-08-31 | Discharge: 2020-08-31 | Disposition: A | Discharge status: Home / Self Care | Payer: No Typology Code available for payment source | Attending: Physician Assistant | Admitting: Physician Assistant

## 2020-08-31 DIAGNOSIS — R112 Nausea with vomiting, unspecified: Secondary | ICD-10-CM | POA: Diagnosis not present

## 2020-08-31 DIAGNOSIS — Z87891 Personal history of nicotine dependence: Secondary | ICD-10-CM | POA: Diagnosis not present

## 2020-08-31 DIAGNOSIS — R000 Tachycardia, unspecified: Secondary | ICD-10-CM | POA: Diagnosis not present

## 2020-08-31 DIAGNOSIS — W228XXA Striking against or struck by other objects, initial encounter: Secondary | ICD-10-CM | POA: Diagnosis not present

## 2020-08-31 DIAGNOSIS — S0083XA Contusion of other part of head, initial encounter: Secondary | ICD-10-CM | POA: Insufficient documentation

## 2020-08-31 DIAGNOSIS — R519 Headache, unspecified: Secondary | ICD-10-CM

## 2020-08-31 DIAGNOSIS — R1111 Vomiting without nausea: Secondary | ICD-10-CM

## 2020-08-31 DIAGNOSIS — S0990XA Unspecified injury of head, initial encounter: Secondary | ICD-10-CM | POA: Diagnosis present

## 2020-08-31 DIAGNOSIS — E860 Dehydration: Secondary | ICD-10-CM

## 2020-08-31 DIAGNOSIS — Y990 Civilian activity done for income or pay: Secondary | ICD-10-CM | POA: Diagnosis not present

## 2020-08-31 LAB — CBC WITH DIFFERENTIAL/PLATELET
Abs Immature Granulocytes: 0.04 10*3/uL (ref 0.00–0.07)
Basophils Absolute: 0 10*3/uL (ref 0.0–0.1)
Basophils Relative: 0 %
Eosinophils Absolute: 0 10*3/uL (ref 0.0–0.5)
Eosinophils Relative: 0 %
HCT: 39.9 % (ref 36.0–46.0)
Hemoglobin: 13.3 g/dL (ref 12.0–15.0)
Immature Granulocytes: 0 %
Lymphocytes Relative: 1 %
Lymphs Abs: 0.2 10*3/uL — ABNORMAL LOW (ref 0.7–4.0)
MCH: 29.4 pg (ref 26.0–34.0)
MCHC: 33.3 g/dL (ref 30.0–36.0)
MCV: 88.3 fL (ref 80.0–100.0)
Monocytes Absolute: 0.3 10*3/uL (ref 0.1–1.0)
Monocytes Relative: 3 %
Neutro Abs: 11.2 10*3/uL — ABNORMAL HIGH (ref 1.7–7.7)
Neutrophils Relative %: 96 %
Platelets: 276 10*3/uL (ref 150–400)
RBC: 4.52 MIL/uL (ref 3.87–5.11)
RDW: 12.8 % (ref 11.5–15.5)
WBC: 11.7 10*3/uL — ABNORMAL HIGH (ref 4.0–10.5)
nRBC: 0 % (ref 0.0–0.2)

## 2020-08-31 LAB — COMPREHENSIVE METABOLIC PANEL
ALT: 25 U/L (ref 0–44)
AST: 27 U/L (ref 15–41)
Albumin: 4.1 g/dL (ref 3.5–5.0)
Alkaline Phosphatase: 69 U/L (ref 38–126)
Anion gap: 10 (ref 5–15)
BUN: 22 mg/dL — ABNORMAL HIGH (ref 6–20)
CO2: 22 mmol/L (ref 22–32)
Calcium: 8.7 mg/dL — ABNORMAL LOW (ref 8.9–10.3)
Chloride: 103 mmol/L (ref 98–111)
Creatinine, Ser: 0.7 mg/dL (ref 0.44–1.00)
GFR, Estimated: >60 mL/min (ref >60)
Glucose, Bld: 116 mg/dL — ABNORMAL HIGH (ref 70–99)
Potassium: 3.5 mmol/L (ref 3.5–5.1)
Sodium: 135 mmol/L (ref 135–145)
Total Bilirubin: 1.1 mg/dL (ref 0.3–1.2)
Total Protein: 7.8 g/dL (ref 6.5–8.1)

## 2020-08-31 LAB — URINALYSIS, ROUTINE W REFLEX MICROSCOPIC
Bacteria, UA: NONE SEEN
Bilirubin Urine: NEGATIVE
Glucose, UA: NEGATIVE mg/dL
Hgb urine dipstick: NEGATIVE
Ketones, ur: 80 mg/dL — AB
Leukocytes,Ua: NEGATIVE
Nitrite: NEGATIVE
Protein, ur: 30 mg/dL — AB
Specific Gravity, Urine: 1.028 (ref 1.005–1.030)
pH: 5 (ref 5.0–8.0)

## 2020-08-31 MED ORDER — DIPHENHYDRAMINE HCL 50 MG/ML IJ SOLN
12.5000 mg | Freq: Once | INTRAMUSCULAR | Status: AC
  Administered 2020-08-31: 12.5 mg via INTRAVENOUS
  Filled 2020-08-31: qty 1

## 2020-08-31 MED ORDER — ONDANSETRON HCL 4 MG/2ML IJ SOLN
4.0000 mg | Freq: Once | INTRAMUSCULAR | Status: AC
  Administered 2020-08-31: 4 mg via INTRAVENOUS
  Filled 2020-08-31: qty 2

## 2020-08-31 MED ORDER — SODIUM CHLORIDE 0.9 % IV BOLUS
1000.0000 mL | Freq: Once | INTRAVENOUS | Status: AC
  Administered 2020-08-31: 1000 mL via INTRAVENOUS

## 2020-08-31 MED ORDER — KETOROLAC TROMETHAMINE 30 MG/ML IJ SOLN
30.0000 mg | Freq: Once | INTRAMUSCULAR | Status: AC
  Administered 2020-08-31: 30 mg via INTRAVENOUS
  Filled 2020-08-31: qty 1

## 2020-08-31 MED ORDER — METOCLOPRAMIDE HCL 5 MG/ML IJ SOLN
10.0000 mg | Freq: Once | INTRAMUSCULAR | Status: AC
  Administered 2020-08-31: 10 mg via INTRAVENOUS
  Filled 2020-08-31: qty 2

## 2020-08-31 NOTE — ED Provider Notes (Signed)
[facility] EMERGENCY DEPARTMENT Provider Note   CSN: [id number] Arrival date & time: 08/31/20  2353     History Chief Complaint  Patient presents with  . Vomiting    Anna Willis is a 40 y.o. female.  Pt reports she hit her head on a metal shelf 2 days ago.  Pt reports forehead was sore,  Pt did not black out   The history is provided by the patient. No language interpreter was used.  Emesis Severity:  Moderate Timing:  Constant Progression:  Worsening Chronicity:  New Recent urination:  Normal Relieved by:  Nothing Worsened by:  Nothing Ineffective treatments:  None tried Risk factors: no sick contacts        Past Medical History:  Diagnosis Date  . Acid reflux   . Anxiety   . Complication of anesthesia    muscle weakness after surgery     . Migraine   . Neurofibromatosis ([facility])   . Scoliosis   . Seasonal allergies   . Tachycardia     Patient Active Problem List   Diagnosis Date Noted  . S/P hysterectomy 10/03/2019  . Acute appendicitis   . Abdominal pain 07/17/2019  . Neurofibromatosis syndrome ([facility]) 01/24/2019  . Obesity with body mass index 30 or greater 01/24/2019  . Menorrhagia 11/13/2018  . Dysmenorrhea 03/24/2018    Past Surgical History:  Procedure Laterality Date  . ABDOMINAL HYSTERECTOMY  10/03/2019   Procedure: TOTAL ABDOMINAL HYSTERECTOMY;  Surgeon: [name], MD;  Location: [facility];  Service: Gynecology;;  . BILATERAL SALPINGECTOMY  10/03/2019   Procedure: OPEN BILATERAL SALPINGECTOMY;  Surgeon: [name], MD;  Location: [facility];  Service: Gynecology;;  . DILATION AND CURETTAGE OF UTERUS    . LAPAROSCOPIC APPENDECTOMY N/A 07/18/2019   Procedure: APPENDECTOMY LAPAROSCOPIC;  Surgeon: Lucretia Roers, MD;  Location: [facility];  Service: General;  Laterality: N/A;  . TOE AMPUTATION     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   . WISDOM TOOTH EXTRACTION       OB History    Gravida  1   Para      Term      Preterm      AB   1   Living        SAB  1   IAB      Ectopic      Multiple      Live Births              Family History  Problem Relation Age of Onset  . Migraines Mother   . Epilepsy Mother   . Endometriosis Mother        had hyst at age 78  . Neurofibromatosis Father   . Dementia Maternal Grandmother   . Kidney disease Maternal Grandmother   . Other Maternal Grandmother        shingles  . Heart attack Maternal Grandfather   . Breast cancer Paternal Aunt   . Breast cancer Paternal Aunt   . Breast cancer Paternal Aunt     Social History   Tobacco Use  . Smoking status: Former Smoker    Years: 10.00    Types: Cigarettes  . Smokeless tobacco: Never Used  Vaping Use  . Vaping Use: Never used  Substance Use Topics  . Alcohol use: No  . Drug use: No    Home Medications Prior to Admission medications   Medication Sig Start Date End Date Taking? Authorizing Provider  buPROPion (WELLBUTRIN SR) 150 MG 12 hr tablet Take 150 mg by mouth daily with supper.  03/01/19   [provider]  buPROPion (WELLBUTRIN XL) 150 MG 24 hr tablet TAKE 1 TABLET (150 MG) BY MOUTH ONCE DAILY 06/03/20 06/03/21  Avis Epley, PA-C  clindamycin (CLEOCIN) 150 MG capsule TAKE 1 CAPSULE BY MOUTH 4 TIMES A DAY 02/26/20 02/25/21    Erenumab-aooe (AIMOVIG [place]) Inject 1 Dose into the skin every 30 (thirty) days.    [provider]  Erenumab-aooe (AIMOVIG) 70 MG/ML SOAJ Inject 1 millilter subcutaneous  in the abdomen, thigh or outer area of upper arm once a month 08/07/20     esomeprazole (NEXIUM) 20 MG capsule Take 20 mg by mouth daily before breakfast.     [provider]  ibuprofen (ADVIL) 800 MG tablet TAKE 1 TABLET BY MOUTH 4 TIMES PER DAY AS NEEDED FOR PAIN 02/26/20 02/25/21    metoprolol succinate (TOPROL-XL) 25 MG 24 hr tablet TAKE 1.5 TABLETS (37.5 MG TOTAL) BY MOUTH DAILY. 05/16/20 05/16/21  Strader, Lennart Pall, PA-C  metoprolol succinate (TOPROL-XL) 25 MG 24 hr tablet TAKE 1  TABLET BY MOUTH ONCE DAILY. 05/06/20 05/06/21  Avis Epley, PA-C  ondansetron (ZOFRAN-ODT) 4 MG disintegrating tablet DISSOLVE 1 TABLET UNDER THE TONGUE EVERY 6 HOURS AS NEEDED FOR NAUSEA 03/25/20 03/25/21  Avis Epley, PA-C  Sod Fluoride-Potassium Nitrate 1.1-5 % GEL USE TO BRUSH TWO TIMES DAILY, DO NOT RINSE WITH WATER 02/23/20 02/22/21    SUMAtriptan (IMITREX) 100 MG tablet Take 50 mg by mouth every 2 (two) hours as needed for migraine or headache. 12/12/18   [provider]  SUMAtriptan (IMITREX) 100 MG tablet TAKE 1/2 TO 1 TABLET BY MOUTH AFTER ONSET OF MIGRAINE; MAY REPEAT AFTER 2 HOURS IF HEADACHE RETURNS, NOT TO EXCEED 200MG  IN 24HRS 02/06/20 02/05/21  02/07/21, PA-C    Allergies    Amoxicillin, Rocephin [ceftriaxone], Succinylcholine, and Erythromycin  Review of Systems   Review of Systems  Gastrointestinal: Positive for vomiting.  All other systems reviewed and are negative.   Physical Exam Updated Vital Signs BP 107/68   Pulse (!) 121   Temp 98.8 F (37.1 C) (Oral)   Resp (!) 23   Ht 5' (1.524 m)   Wt 68 kg   LMP 09/27/2019   SpO2 99%   BMI 29.29 kg/m   Physical Exam Vitals and nursing note reviewed.  Constitutional:      Appearance: She is well-developed.  HENT:     Head: Normocephalic.     Right Ear: Tympanic membrane normal.     Left Ear: Tympanic membrane normal.     Nose: Nose normal.     Mouth/Throat:     Mouth: Mucous membranes are moist.  Eyes:     Pupils: Pupils are equal, round, and reactive to light.  Cardiovascular:     Rate and Rhythm: Tachycardia present.     Pulses: Normal pulses.  Pulmonary:     Effort: Pulmonary effort is normal.  Abdominal:     General: There is no distension.  Musculoskeletal:        General: Normal range of motion.     Cervical back: Normal range of motion.  Skin:    General: Skin is warm.  Neurological:     General: No focal deficit present.     Mental Status: She is alert and  oriented to person, place, and time.  Psychiatric:  Mood and Affect: Mood normal.     ED Results / Procedures / Treatments   Labs (all labs ordered are listed, but only abnormal results are displayed) Labs Reviewed  CBC WITH DIFFERENTIAL/PLATELET - Abnormal; Notable for the following components:      Result Value   WBC 11.7 (*)    Neutro Abs 11.2 (*)    Lymphs Abs 0.2 (*)    All other components within normal limits  COMPREHENSIVE METABOLIC PANEL - Abnormal; Notable for the following components:   Glucose, Bld 116 (*)    BUN 22 (*)    Calcium 8.7 (*)    All other components within normal limits  URINALYSIS, ROUTINE W REFLEX MICROSCOPIC - Abnormal; Notable for the following components:   APPearance HAZY (*)    Ketones, ur 80 (*)    Protein, ur 30 (*)    All other components within normal limits    EKG EKG Interpretation  Date/Time:  Sunday Aug 31 2020 09:19:03 EDT Ventricular Rate:  128 PR Interval:  154 QRS Duration: 80 QT Interval:  286 QTC Calculation: 418 R Axis:   100 Text Interpretation: Sinus tachycardia Borderline right axis deviation Borderline low voltage, extremity leads Confirmed by Alona BeneLong, Joshua [phone]([id number]) on 08/31/2020 10:04:45 AM   Radiology CT Head Wo Contrast  Result Date: 08/31/2020 CLINICAL DATA:  40 year old female with nausea and vomiting following injury yesterday. Initial encounter. EXAM: CT HEAD WITHOUT CONTRAST TECHNIQUE: Contiguous axial images were obtained from the base of the skull through the vertex without intravenous contrast. COMPARISON:  01/08/2015 MR FINDINGS: Brain: No evidence of acute infarction, hemorrhage, hydrocephalus, extra-axial collection or mass lesion/mass effect. Vascular: No hyperdense vessel or unexpected calcification. Skull: Normal. Negative for fracture or focal lesion. Sinuses/Orbits: No acute finding. Other: None. IMPRESSION: Unremarkable noncontrast head CT. Electronically Signed   By: Harmon PierJeffrey  Hu M.D.   On:  08/31/2020 10:46    Procedures Procedures   Medications Ordered in ED Medications  sodium chloride 0.9 % bolus 1,000 mL (0 mLs Intravenous Stopped 08/31/20 1104)  ondansetron (ZOFRAN) injection 4 mg (4 mg Intravenous Given 08/31/20 0937)  ketorolac (TORADOL) 30 MG/ML injection 30 mg (30 mg Intravenous Given 08/31/20 1126)  metoCLOPramide (REGLAN) injection 10 mg (10 mg Intravenous Given 08/31/20 1123)  diphenhydrAMINE (BENADRYL) injection 12.5 mg (12.5 mg Intravenous Given 08/31/20 1125)  sodium chloride 0.9 % bolus 1,000 mL (1,000 mLs Intravenous New Bag/Given 08/31/20 1123)    ED Course  I have reviewed the triage vital signs and the nursing notes.  Pertinent labs & imaging results that were available during my care of the patient were reviewed by me and considered in my medical decision making (see chart for details).    MDM Rules/Calculators/A&P                          MDM:  Ct head is normal,  ua shows greater than 80 ketones. Pt given IV fluids x 2 liters,  Toradol, reglan and benadryl relieved headache and stop vomiting.  Pt able to tolerate po fluids.  Pt has known tachycardia.  Pt takes metoprolol.  Pt did not take today.  Pt advised to continue oral fluids.  Return if any problems.  Final Clinical Impression(s) / ED Diagnoses Final diagnoses:  Contusion of forehead, initial encounter  Nonintractable headache, unspecified chronicity pattern, unspecified headache type  Vomiting without nausea, intractability of vomiting not specified, unspecified vomiting type    Rx / DC Orders ED  Discharge Orders    None    An After Visit Summary was printed and given to the patient.   Elson Areas, [place] 08/31/20 1253    Maia Plan, MD 09/04/20 [phone]

## 2020-08-31 NOTE — ED Triage Notes (Signed)
Patient c/o nausea and vomiting that started last night at 11pm. Denies any diarrhea or urinary symptoms. Per patient body aches and chills-highest temp 99.8. Patient reports tried to take oral zofran at 7:30am but was unable to keep it down. Patient states she did hit her head on the corner of a metal shelf at work x2 days ago in which she had some dizziness right after but none since.

## 2020-09-01 ENCOUNTER — Other Ambulatory Visit: Payer: Self-pay

## 2020-09-02 ENCOUNTER — Other Ambulatory Visit (HOSPITAL_COMMUNITY): Payer: Self-pay

## 2020-09-02 MED ORDER — BUPROPION HCL ER (XL) 150 MG PO TB24
150.0000 mg | ORAL_TABLET | Freq: Every day | ORAL | 0 refills | Status: DC
  Filled 2020-09-02: qty 90, 90d supply, fill #0

## 2020-09-11 ENCOUNTER — Other Ambulatory Visit (HOSPITAL_COMMUNITY): Payer: Self-pay

## 2020-09-11 MED ORDER — BUPROPION HCL ER (XL) 300 MG PO TB24
300.0000 mg | ORAL_TABLET | Freq: Every day | ORAL | 5 refills | Status: DC
  Filled 2020-09-11: qty 30, 30d supply, fill #0
  Filled 2020-10-09: qty 30, 30d supply, fill #1
  Filled 2020-11-25: qty 30, 30d supply, fill #2
  Filled 2020-12-24: qty 30, 30d supply, fill #3

## 2020-09-11 MED ORDER — METOPROLOL SUCCINATE ER 25 MG PO TB24
1.0000 | ORAL_TABLET | Freq: Every day | ORAL | 3 refills | Status: DC
  Filled 2020-09-11: qty 90, 90d supply, fill #0

## 2020-09-12 ENCOUNTER — Other Ambulatory Visit (HOSPITAL_COMMUNITY): Payer: Self-pay

## 2020-09-16 ENCOUNTER — Other Ambulatory Visit (HOSPITAL_COMMUNITY): Payer: Self-pay

## 2020-09-16 MED ORDER — VITAMIN D (ERGOCALCIFEROL) 1.25 MG (50000 UNIT) PO CAPS
50000.0000 [IU] | ORAL_CAPSULE | ORAL | 0 refills | Status: DC
  Filled 2020-09-16: qty 8, 56d supply, fill #0

## 2020-10-09 ENCOUNTER — Other Ambulatory Visit (HOSPITAL_COMMUNITY): Payer: Self-pay

## 2020-11-07 ENCOUNTER — Other Ambulatory Visit (HOSPITAL_COMMUNITY): Payer: Self-pay

## 2020-11-14 ENCOUNTER — Other Ambulatory Visit (HOSPITAL_COMMUNITY): Payer: Self-pay

## 2020-11-14 MED ORDER — DOXYCYCLINE HYCLATE 100 MG PO CAPS
100.0000 mg | ORAL_CAPSULE | Freq: Two times a day (BID) | ORAL | 0 refills | Status: DC
  Filled 2020-11-14: qty 20, 10d supply, fill #0

## 2020-11-17 ENCOUNTER — Other Ambulatory Visit (HOSPITAL_COMMUNITY): Payer: Self-pay

## 2020-11-19 ENCOUNTER — Ambulatory Visit: Payer: No Typology Code available for payment source | Admitting: Cardiology

## 2020-11-25 ENCOUNTER — Other Ambulatory Visit (HOSPITAL_COMMUNITY): Payer: Self-pay

## 2020-11-25 ENCOUNTER — Ambulatory Visit: Payer: No Typology Code available for payment source | Admitting: Family Medicine

## 2020-11-25 MED ORDER — PREDNISONE 10 MG PO TABS
ORAL_TABLET | ORAL | 0 refills | Status: DC
  Filled 2020-11-25: qty 45, 15d supply, fill #0

## 2020-11-25 MED ORDER — CELECOXIB 200 MG PO CAPS
200.0000 mg | ORAL_CAPSULE | Freq: Two times a day (BID) | ORAL | 0 refills | Status: DC | PRN
  Filled 2020-11-25: qty 60, 30d supply, fill #0

## 2020-11-26 ENCOUNTER — Other Ambulatory Visit (HOSPITAL_COMMUNITY): Payer: Self-pay

## 2020-11-26 MED ORDER — CARESTART COVID-19 HOME TEST VI KIT
PACK | 0 refills | Status: DC
  Filled 2020-11-26: qty 2, 2d supply, fill #0

## 2020-12-08 ENCOUNTER — Other Ambulatory Visit (HOSPITAL_COMMUNITY): Payer: Self-pay

## 2020-12-09 ENCOUNTER — Other Ambulatory Visit (HOSPITAL_COMMUNITY): Payer: Self-pay

## 2020-12-24 ENCOUNTER — Other Ambulatory Visit (HOSPITAL_COMMUNITY): Payer: Self-pay

## 2020-12-29 ENCOUNTER — Other Ambulatory Visit (HOSPITAL_COMMUNITY): Payer: Self-pay

## 2020-12-31 ENCOUNTER — Other Ambulatory Visit (HOSPITAL_COMMUNITY): Payer: Self-pay

## 2021-01-08 ENCOUNTER — Ambulatory Visit: Payer: No Typology Code available for payment source | Admitting: Cardiology

## 2021-01-08 ENCOUNTER — Other Ambulatory Visit (HOSPITAL_COMMUNITY): Payer: Self-pay

## 2021-01-13 ENCOUNTER — Other Ambulatory Visit (HOSPITAL_COMMUNITY): Payer: Self-pay

## 2021-01-13 MED FILL — Sumatriptan Succinate Tab 100 MG: ORAL | 30 days supply | Qty: 9 | Fill #1 | Status: AC

## 2021-02-02 ENCOUNTER — Encounter: Payer: Self-pay | Admitting: Emergency Medicine

## 2021-02-02 ENCOUNTER — Other Ambulatory Visit: Payer: Self-pay

## 2021-02-02 ENCOUNTER — Ambulatory Visit
Admission: EM | Admit: 2021-02-02 | Discharge: 2021-02-02 | Disposition: A | Discharge status: Home / Self Care | Payer: No Typology Code available for payment source | Attending: Urgent Care | Admitting: Urgent Care

## 2021-02-02 ENCOUNTER — Other Ambulatory Visit (HOSPITAL_COMMUNITY): Payer: Self-pay

## 2021-02-02 DIAGNOSIS — J069 Acute upper respiratory infection, unspecified: Secondary | ICD-10-CM

## 2021-02-02 DIAGNOSIS — R070 Pain in throat: Secondary | ICD-10-CM

## 2021-02-02 DIAGNOSIS — J3089 Other allergic rhinitis: Secondary | ICD-10-CM

## 2021-02-02 MED ORDER — BENZONATATE 100 MG PO CAPS
100.0000 mg | ORAL_CAPSULE | Freq: Three times a day (TID) | ORAL | 0 refills | Status: DC | PRN
  Filled 2021-02-02: qty 60, 10d supply, fill #0

## 2021-02-02 MED ORDER — CETIRIZINE HCL 10 MG PO TABS
10.0000 mg | ORAL_TABLET | Freq: Every day | ORAL | 0 refills | Status: AC
  Filled 2021-02-02: qty 30, 30d supply, fill #0

## 2021-02-02 MED ORDER — PSEUDOEPHEDRINE HCL 30 MG PO TABS
30.0000 mg | ORAL_TABLET | Freq: Three times a day (TID) | ORAL | 0 refills | Status: DC | PRN
  Filled 2021-02-02: qty 30, 10d supply, fill #0

## 2021-02-02 MED ORDER — PROMETHAZINE-DM 6.25-15 MG/5ML PO SYRP
5.0000 mL | ORAL_SOLUTION | Freq: Every evening | ORAL | 0 refills | Status: DC | PRN
  Filled 2021-02-02: qty 100, 20d supply, fill #0

## 2021-02-02 NOTE — ED Provider Notes (Addendum)
[place]   MRN: [id number] DOB: 1980-05-04  Subjective:   Tracey Hermance is a 40 y.o. female presenting for 4-day history of acute onset persistent sore throat, cough, congestion.  Symptoms initially started out with body aches and diarrhea but those have not improved.  She did COVID test at home and was negative.  No chest pain, shortness of breath, fevers.  Patient is non-smoker.  No history of respiratory disorders.  Has allergies but does not take anything consistently for this.  No current facility-administered medications for this encounter.  Current Outpatient Medications:    buPROPion (WELLBUTRIN SR) 150 MG 12 hr tablet, Take 150 mg by mouth daily with supper. , Disp: , Rfl:    buPROPion (WELLBUTRIN XL) 300 MG 24 hr tablet, Take 1 tablet (300 mg total) by mouth daily., Disp: 30 tablet, Rfl: 5   celecoxib (CELEBREX) 200 MG capsule, Take 1 capsule (200 mg total) by mouth 2 (two) times daily as needed., Disp: 60 capsule, Rfl: 0   clindamycin (CLEOCIN) 150 MG capsule, TAKE 1 CAPSULE BY MOUTH 4 TIMES A DAY, Disp: 40 capsule, Rfl: 0   COVID-19 At Home Antigen Test (CARESTART COVID-19 HOME TEST) KIT, Use as directed., Disp: 2 each, Rfl: 0   Erenumab-aooe (AIMOVIG [place]), Inject 1 Dose into the skin every 30 (thirty) days., Disp: , Rfl:    Erenumab-aooe (AIMOVIG) 70 MG/ML SOAJ, Inject 1 millilter subcutaneous  in the abdomen, thigh or outer area of upper arm once a month, Disp: 3 mL, Rfl: 3   esomeprazole (NEXIUM) 20 MG capsule, Take 20 mg by mouth daily before breakfast. , Disp: , Rfl:    ibuprofen (ADVIL) 800 MG tablet, TAKE 1 TABLET BY MOUTH 4 TIMES PER DAY AS NEEDED FOR PAIN, Disp: 16 tablet, Rfl: 0   metoprolol succinate (TOPROL-XL) 25 MG 24 hr tablet, TAKE 1.5 TABLETS (37.5 MG TOTAL) BY MOUTH DAILY., Disp: 135 tablet, Rfl: 3   metoprolol succinate (TOPROL-XL) 25 MG 24 hr tablet, TAKE 1 TABLET BY MOUTH ONCE DAILY., Disp: 90 tablet, Rfl: 0   metoprolol succinate  (TOPROL-XL) 25 MG 24 hr tablet, Take 1 tablet (25 mg total) by mouth daily., Disp: 90 tablet, Rfl: 3   ondansetron (ZOFRAN-ODT) 4 MG disintegrating tablet, DISSOLVE 1 TABLET UNDER THE TONGUE EVERY 6 HOURS AS NEEDED FOR NAUSEA, Disp: 20 tablet, Rfl: 0   predniSONE (DELTASONE) 10 MG tablet, Take 5 tablets by mouth daily for 3 days, 4 tabs daily for 3 days, 3 tabs daily for 3 days, 2 tabs daily for 3 days, 1 tab dailyfor 3 days, Disp: 45 tablet, Rfl: 0   Sod Fluoride-Potassium Nitrate 1.1-5 % GEL, USE TO BRUSH TWO TIMES DAILY, DO NOT RINSE WITH WATER, Disp: 100 g, Rfl: 2   SUMAtriptan (IMITREX) 100 MG tablet, Take 50 mg by mouth every 2 (two) hours as needed for migraine or headache., Disp: , Rfl:    SUMAtriptan (IMITREX) 100 MG tablet, TAKE 1/2 TO 1 TABLET BY MOUTH AFTER ONSET OF MIGRAINE; MAY REPEAT AFTER 2 HOURS IF HEADACHE RETURNS, NOT TO EXCEED 200MG IN 24HRS, Disp: 9 tablet, Rfl: 5   Allergies  Allergen Reactions   Amoxicillin Nausea And Vomiting    Projectile vomiting   Rocephin [Ceftriaxone] Nausea And Vomiting   Succinylcholine Other (See Comments)    Muscle weakness/muscle pain.   Erythromycin Rash    Past Medical History:  Diagnosis Date   Acid reflux    Anxiety    Complication of anesthesia  muscle weakness after surgery      Migraine    Neurofibromatosis ([place])    Scoliosis    Seasonal allergies    Tachycardia      Past Surgical History:  Procedure Laterality Date   ABDOMINAL HYSTERECTOMY  10/03/2019   Procedure: TOTAL ABDOMINAL HYSTERECTOMY;  Surgeon: Florian Buff, MD;  Location: [facility];  Service: Gynecology;;   BILATERAL SALPINGECTOMY  10/03/2019   Procedure: OPEN BILATERAL SALPINGECTOMY;  Surgeon: Florian Buff, MD;  Location: [facility];  Service: Gynecology;;   DILATION AND CURETTAGE OF UTERUS     LAPAROSCOPIC APPENDECTOMY N/A 07/18/2019   Procedure: APPENDECTOMY LAPAROSCOPIC;  Surgeon: Virl Cagey, MD;  Location: [facility];  Service: General;  Laterality: N/A;    TOE AMPUTATION     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    26 TOOTH EXTRACTION      Family History  Problem Relation Age of Onset   Migraines Mother    Epilepsy Mother    Endometriosis Mother        had hyst at age 65   Neurofibromatosis Father    Dementia Maternal Grandmother    Kidney disease Maternal Grandmother    Other Maternal Grandmother        shingles   Heart attack Maternal Grandfather    Breast cancer Paternal Aunt    Breast cancer Paternal Aunt    Breast cancer Paternal Aunt     Social History   Tobacco Use   Smoking status: Former    Years: 10.00    Types: Cigarettes   Smokeless tobacco: Never  Vaping Use   Vaping Use: Never used  Substance Use Topics   Alcohol use: No   Drug use: No    ROS   Objective:   Vitals: BP 114/80   Pulse 86   Temp 98.4 F (36.9 C)   Resp 18   LMP 09/27/2019   SpO2 98%   Physical Exam Constitutional:      General: She is not in acute distress.    Appearance: Normal appearance. She is well-developed. She is not ill-appearing, toxic-appearing or diaphoretic.  HENT:     Head: Normocephalic and atraumatic.     Right Ear: Tympanic membrane and ear canal normal. No drainage or tenderness. No middle ear effusion. Tympanic membrane is not erythematous.     Left Ear: Tympanic membrane and ear canal normal. No drainage or tenderness.  No middle ear effusion. Tympanic membrane is not erythematous.     Nose: Nose normal. No congestion or rhinorrhea.     Mouth/Throat:     Mouth: Mucous membranes are moist. No oral lesions.     Pharynx: Oropharynx is clear. No pharyngeal swelling, oropharyngeal exudate, posterior oropharyngeal erythema or uvula swelling.     Tonsils: No tonsillar exudate or tonsillar abscesses.  Eyes:     Extraocular Movements: Extraocular movements intact.     Right eye: Normal extraocular motion.     Left eye: Normal extraocular motion.     Conjunctiva/sclera: Conjunctivae normal.     Pupils: Pupils are  equal, round, and reactive to light.  Cardiovascular:     Rate and Rhythm: Normal rate and regular rhythm.     Pulses: Normal pulses.     Heart sounds: Normal heart sounds. No murmur heard.   No friction rub. No gallop.  Pulmonary:     Effort: Pulmonary effort is normal. No respiratory distress.     Breath sounds: Normal breath sounds. No stridor.  No wheezing, rhonchi or rales.  Musculoskeletal:     Cervical back: Normal range of motion and neck supple.  Lymphadenopathy:     Cervical: No cervical adenopathy.  Skin:    General: Skin is warm and dry.     Findings: No rash.  Neurological:     General: No focal deficit present.     Mental Status: She is alert and oriented to person, place, and time.  Psychiatric:        Mood and Affect: Mood normal.        Behavior: Behavior normal.        Thought Content: Thought content normal.    Assessment and Plan :   PDMP not reviewed this encounter.  1. Viral URI with cough   2. Throat pain    Suspect viral URI, viral syndrome; physical exam findings reassuring and vital signs stable for discharge. Advised supportive care, offered symptomatic relief. Declined covid testing. Deferred imaging given clear cardiopulmonary exam, hemodynamically stable vital signs. Counseled patient on potential for adverse effects with medications prescribed/recommended today, ER and return-to-clinic precautions discussed, patient verbalized understanding.     Jaynee Eagles, [place] 02/02/21 [phone]

## 2021-02-02 NOTE — ED Triage Notes (Signed)
Pt is present today with sore throat, cough, diarrhea. Pt states sx started Thursday

## 2021-02-06 ENCOUNTER — Other Ambulatory Visit (HOSPITAL_COMMUNITY): Payer: Self-pay

## 2021-02-06 MED ORDER — AIMOVIG 70 MG/ML ~~LOC~~ SOAJ
70.0000 mg | SUBCUTANEOUS | 11 refills | Status: DC
  Filled 2021-02-06: qty 1, 30d supply, fill #0
  Filled 2021-03-13 – 2021-03-24 (×7): qty 1, 30d supply, fill #1
  Filled 2021-04-20: qty 1, 30d supply, fill #2
  Filled 2021-05-15: qty 1, 30d supply, fill #3
  Filled 2021-07-24: qty 1, 30d supply, fill #4
  Filled 2021-08-19: qty 1, 30d supply, fill #5
  Filled 2021-09-18: qty 1, 30d supply, fill #6

## 2021-02-06 MED ORDER — METOPROLOL SUCCINATE ER 25 MG PO TB24
25.0000 mg | ORAL_TABLET | Freq: Every day | ORAL | 3 refills | Status: DC
  Filled 2021-02-06: qty 90, 90d supply, fill #0
  Filled 2021-06-23: qty 90, 90d supply, fill #1
  Filled 2021-08-31 – 2021-09-18 (×2): qty 90, 90d supply, fill #2

## 2021-02-06 MED ORDER — BUPROPION HCL ER (XL) 300 MG PO TB24
300.0000 mg | ORAL_TABLET | Freq: Every day | ORAL | 3 refills | Status: DC
  Filled 2021-02-06: qty 90, 90d supply, fill #0
  Filled 2021-05-15: qty 90, 90d supply, fill #1
  Filled 2021-08-19: qty 90, 90d supply, fill #2
  Filled 2021-11-11: qty 90, 90d supply, fill #3

## 2021-02-16 ENCOUNTER — Other Ambulatory Visit (HOSPITAL_COMMUNITY): Payer: Self-pay

## 2021-03-13 ENCOUNTER — Other Ambulatory Visit (HOSPITAL_COMMUNITY): Payer: Self-pay

## 2021-03-17 ENCOUNTER — Other Ambulatory Visit (HOSPITAL_COMMUNITY): Payer: Self-pay

## 2021-03-17 ENCOUNTER — Ambulatory Visit: Payer: No Typology Code available for payment source | Admitting: Cardiology

## 2021-03-18 ENCOUNTER — Other Ambulatory Visit (HOSPITAL_COMMUNITY): Payer: Self-pay

## 2021-03-20 ENCOUNTER — Other Ambulatory Visit: Payer: Self-pay

## 2021-03-20 ENCOUNTER — Other Ambulatory Visit (HOSPITAL_COMMUNITY): Payer: Self-pay

## 2021-03-23 ENCOUNTER — Other Ambulatory Visit (HOSPITAL_COMMUNITY): Payer: Self-pay

## 2021-03-24 ENCOUNTER — Other Ambulatory Visit (HOSPITAL_COMMUNITY): Payer: Self-pay

## 2021-03-24 ENCOUNTER — Other Ambulatory Visit: Payer: Self-pay

## 2021-03-26 ENCOUNTER — Other Ambulatory Visit (HOSPITAL_COMMUNITY): Payer: Self-pay

## 2021-03-26 MED ORDER — SUMATRIPTAN SUCCINATE 100 MG PO TABS
50.0000 mg | ORAL_TABLET | ORAL | 5 refills | Status: DC
  Filled 2021-03-26 – 2021-04-20 (×2): qty 9, 30d supply, fill #0
  Filled 2021-09-01: qty 9, 30d supply, fill #1

## 2021-03-31 ENCOUNTER — Other Ambulatory Visit (HOSPITAL_COMMUNITY): Payer: Self-pay

## 2021-04-07 ENCOUNTER — Other Ambulatory Visit (HOSPITAL_COMMUNITY): Payer: Self-pay

## 2021-04-20 ENCOUNTER — Other Ambulatory Visit (HOSPITAL_COMMUNITY): Payer: Self-pay

## 2021-04-23 ENCOUNTER — Other Ambulatory Visit (HOSPITAL_COMMUNITY): Payer: Self-pay

## 2021-04-23 MED ORDER — PANTOPRAZOLE SODIUM 40 MG PO TBEC
40.0000 mg | DELAYED_RELEASE_TABLET | Freq: Two times a day (BID) | ORAL | 11 refills | Status: DC
  Filled 2021-04-23 – 2021-05-15 (×2): qty 60, 30d supply, fill #0

## 2021-04-23 MED ORDER — ONDANSETRON HCL 4 MG PO TABS
4.0000 mg | ORAL_TABLET | Freq: Four times a day (QID) | ORAL | 9 refills | Status: DC
  Filled 2021-04-23 – 2021-05-15 (×2): qty 40, 10d supply, fill #0
  Filled 2021-11-22: qty 40, 10d supply, fill #1

## 2021-04-27 ENCOUNTER — Encounter: Payer: Self-pay | Admitting: Neurology

## 2021-05-01 ENCOUNTER — Other Ambulatory Visit (HOSPITAL_COMMUNITY): Payer: Self-pay

## 2021-05-15 ENCOUNTER — Other Ambulatory Visit (HOSPITAL_COMMUNITY): Payer: Self-pay

## 2021-06-08 NOTE — Progress Notes (Unsigned)
NEUROLOGY CONSULTATION NOTE  Anna Willis MRN: [id number] DOB: November 18, 1980  Referring provider: Delman Cheadle, PA-C Primary care provider: Delman Cheadle, PA-C  Reason for consult:  neurofibromatosis, migraine  Assessment/Plan:   ***   Subjective:  Anna Willis is a 41 year old female who presents for neurofibromatosis type 1 and migraine.  History supplemented by prior neurologist's and referring provider's notes.  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  She was born with cafe au lait spots and subsequently developed subcutaneous neurofibromas over the years.  ***  She has history of migraines since childhood.  Initially sporadic.  They became frequent in 2015.  She was found to have elevated prolactin levels and was started on bromocriptine.  MRI of brain/pituitary with and without contrast on 01/08/2015  was personally reviewed and unremarkable.     Current NSAIDS/analgesics:  celecoxib 270m BID Current triptans:  sumatriptan 10129mCurrent ergotamine:  none Current anti-emetic:  ondansetron 29m50murrent muscle relaxants:  none Current Antihypertensive medications:  metoprolol succinate ER 68m37mily Current Antidepressant medications:  Anna Willis XL 300mg53mrent Anticonvulsant medications:  none Current anti-CGRP:  Anna Willis 70mg 21ment Vitamins/Herbal/Supplements:  none Current Antihistamines/Decongestants:  none Other therapy:  none Hormone/birth control:  none  Past NSAIDS/analgesics:  Cambia, ketorolac Past abortive triptans:  rizatriptan Past abortive ergotamine:  none Past muscle relaxants:  none Past anti-emetic:  none Past antihypertensive medications:  none Past antidepressant medications:  nortriptyline Past anticonvulsant medications:  none Past anti-CGRP:  none Past vitamins/Herbal/Supplements:  none Past antihistamines/decongestants:  Benadryl Other past therapies:  ***  Caffeine:  *** Alcohol:   *** Smoker:  *** Diet:  *** Exercise:  *** Depression:  ***; Anxiety:  *** Other pain:  *** Sleep hygiene:  *** Family history of headache:  ***      PAST MEDICAL HISTORY: Past Medical History:  Diagnosis Date   Acid reflux    Anxiety    Complication of anesthesia    muscle weakness after surgery      Migraine    Neurofibromatosis ([facility])  Bascomcoliosis    Seasonal allergies    Tachycardia     PAST SURGICAL HISTORY: Past Surgical History:  Procedure Laterality Date   ABDOMINAL HYSTERECTOMY  10/03/2019   Procedure: TOTAL ABDOMINAL HYSTERECTOMY;  Surgeon: Anna Willis Location: [facility];  Service: Gynecology;;   BILATERAL SALPINGECTOMY  10/03/2019   Procedure: OPEN BILATERAL SALPINGECTOMY;  Surgeon: Anna Willis Location: [facility];  Service: Gynecology;;   DILATION AND CURETTAGE OF UTERUS     LAPAROSCOPIC APPENDECTOMY N/A 07/18/2019   Procedure: APPENDECTOMY LAPAROSCOPIC;  Surgeon: Anna Willis Location: [facility];  Service: General;  Laterality: N/A;   TOE AMPUTATION     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    [facility] Outpatient Medications on File Prior to Visit  Medication Sig Dispense Refill   benzonatate (Anna Willis) 100 MG capsule Take 1-2 capsules (100-200 mg total) by mouth 3 (three) times daily as needed for cough. 60 capsule 0   buPROPion (Anna Willis XL) 300 MG 24 hr tablet Take 1 tablet (300 mg total) by mouth daily. 30 tablet 5   buPROPion (Anna Willis XL) 300 MG 24 hr tablet Take 1 tablet (300 mg total) by mouth daily. 90 tablet 3   celecoxib (Anna Willis) 200 MG capsule Take 1 capsule (200 mg total) by mouth 2 (two) times  daily as needed. 60 capsule 0   cetirizine (Anna Willis ALLERGY) 10 MG tablet Take 1 tablet (10 mg total) by mouth daily. 30 tablet 0   COVID-19 At Home Antigen Test (Anna Willis COVID-19 HOME TEST) KIT Use as directed. 2 each 0   Anna Willis (Anna Willis [place]) Inject 1 Dose into the skin every 30  (thirty) days.     Anna Willis (Anna Willis) 70 MG/ML SOAJ Inject 1 millilter subcutaneous  in the abdomen, thigh or outer area of upper arm once a month 3 mL 3   Anna Willis (Anna Willis) 70 MG/ML SOAJ Inject 70 mg into the skin once a month 1 mL 11   esomeprazole (NEXIUM) 20 MG capsule Take 20 mg by mouth daily before breakfast.      metoprolol succinate (Anna Willis) 25 MG 24 hr tablet TAKE 1.5 TABLETS (37.5 MG TOTAL) BY MOUTH DAILY. 135 tablet 3   metoprolol succinate (Anna Willis) 25 MG 24 hr tablet TAKE 1 TABLET BY MOUTH ONCE DAILY. 90 tablet 0   metoprolol succinate (Anna Willis) 25 MG 24 hr tablet Take 1 tablet (25 mg total) by mouth daily. 90 tablet 3   metoprolol succinate (Anna Willis) 25 MG 24 hr tablet Take 1 tablet (25 mg total) by mouth daily. 90 tablet 3   ondansetron (ZOFRAN) 4 MG tablet Take 1 tablet (4 mg total) by mouth every 6 (six) hours. 40 tablet 9   pantoprazole (PROTONIX) 40 MG tablet Take 1 tablet (40 mg total) by mouth 2 (two) times daily. 60 tablet 11   promethazine-dextromethorphan (PROMETHAZINE-DM) 6.25-15 MG/5ML syrup Take 5 mLs by mouth at bedtime as needed for cough. 100 mL 0   pseudoephedrine (SUDAFED) 30 MG tablet Take 1 tablet (30 mg total) by mouth every 8 (eight) hours as needed for congestion. 30 tablet 0   SUMAtriptan (IMITREX) 100 MG tablet Take 50 mg by mouth every 2 (two) hours as needed for migraine or headache.     SUMAtriptan (IMITREX) 100 MG tablet TAKE 1/2 TO 1 TABLET BY MOUTH AFTER ONSET OF MIGRAINE; MAY REPEAT AFTER 2 HOURS IF HEADACHE RETURNS, NOT TO EXCEED 200MG IN 24HRS 9 tablet 5   SUMAtriptan (IMITREX) 100 MG tablet Take 1/2 to 1 tablet by mouth after onset of migraine; May repeat after 2 hours if headache returns, not to exceed 245m in 24 hours 9 tablet 5   No current facility-administered medications on file prior to visit.    ALLERGIES: Allergies  Allergen Reactions   Amoxicillin Nausea And Vomiting    Projectile vomiting   Rocephin  [Ceftriaxone] Nausea And Vomiting   Succinylcholine Other (See Comments)    Muscle weakness/muscle pain.   Erythromycin Rash    FAMILY HISTORY: Family History  Problem Relation Age of Onset   Migraines Mother    Epilepsy Mother    Endometriosis Mother        had hyst at age 41  Neurofibromatosis Father    Dementia Maternal Grandmother    Kidney disease Maternal Grandmother    Other Maternal Grandmother        shingles   Heart attack Maternal Grandfather    Breast cancer Paternal Aunt    Breast cancer Paternal Aunt    Breast cancer Paternal Aunt     Objective:  *** General: No acute distress.  Patient appears well-groomed.   Head:  Normocephalic/atraumatic Eyes:  fundi examined but not visualized Neck: supple, no paraspinal tenderness, full range of motion Back: No paraspinal tenderness Heart: regular rate and rhythm Lungs: Clear to auscultation bilaterally.  Vascular: No carotid bruits. Neurological Exam: Mental status: alert and oriented to person, place, and time, recent and remote memory intact, fund of knowledge intact, attention and concentration intact, speech fluent and not dysarthric, language intact. Cranial nerves: CN I: not tested CN II: pupils equal, round and reactive to light, visual fields intact CN III, IV, VI:  full range of motion, no nystagmus, no ptosis CN V: facial sensation intact. CN VII: upper and lower face symmetric CN VIII: hearing intact CN IX, X: gag intact, uvula midline CN XI: sternocleidomastoid and trapezius muscles intact CN XII: tongue midline Bulk & Tone: normal, no fasciculations. Motor:  muscle strength 5/5 throughout Sensation:  Pinprick, temperature and vibratory sensation intact. Deep Tendon Reflexes:  2+ throughout,  toes downgoing.   Finger to nose testing:  Without dysmetria.   Heel to shin:  Without dysmetria.   Gait:  Normal station and stride.  Romberg negative.    Thank you for allowing me to take part in the care  of this patient.  Metta Clines, DO  CC: ***

## 2021-06-09 ENCOUNTER — Ambulatory Visit: Payer: Self-pay | Admitting: Neurology

## 2021-06-20 IMAGING — DX DG ABDOMEN ACUTE W/ 1V CHEST
4 series · 4 of 4 positions shown · non-contrast
Comparison: None.

CLINICAL DATA: Generalized body aches and weakness status post
recent appendectomy.

EXAM:
DG ABDOMEN ACUTE W/ 1V CHEST

[chest ap]
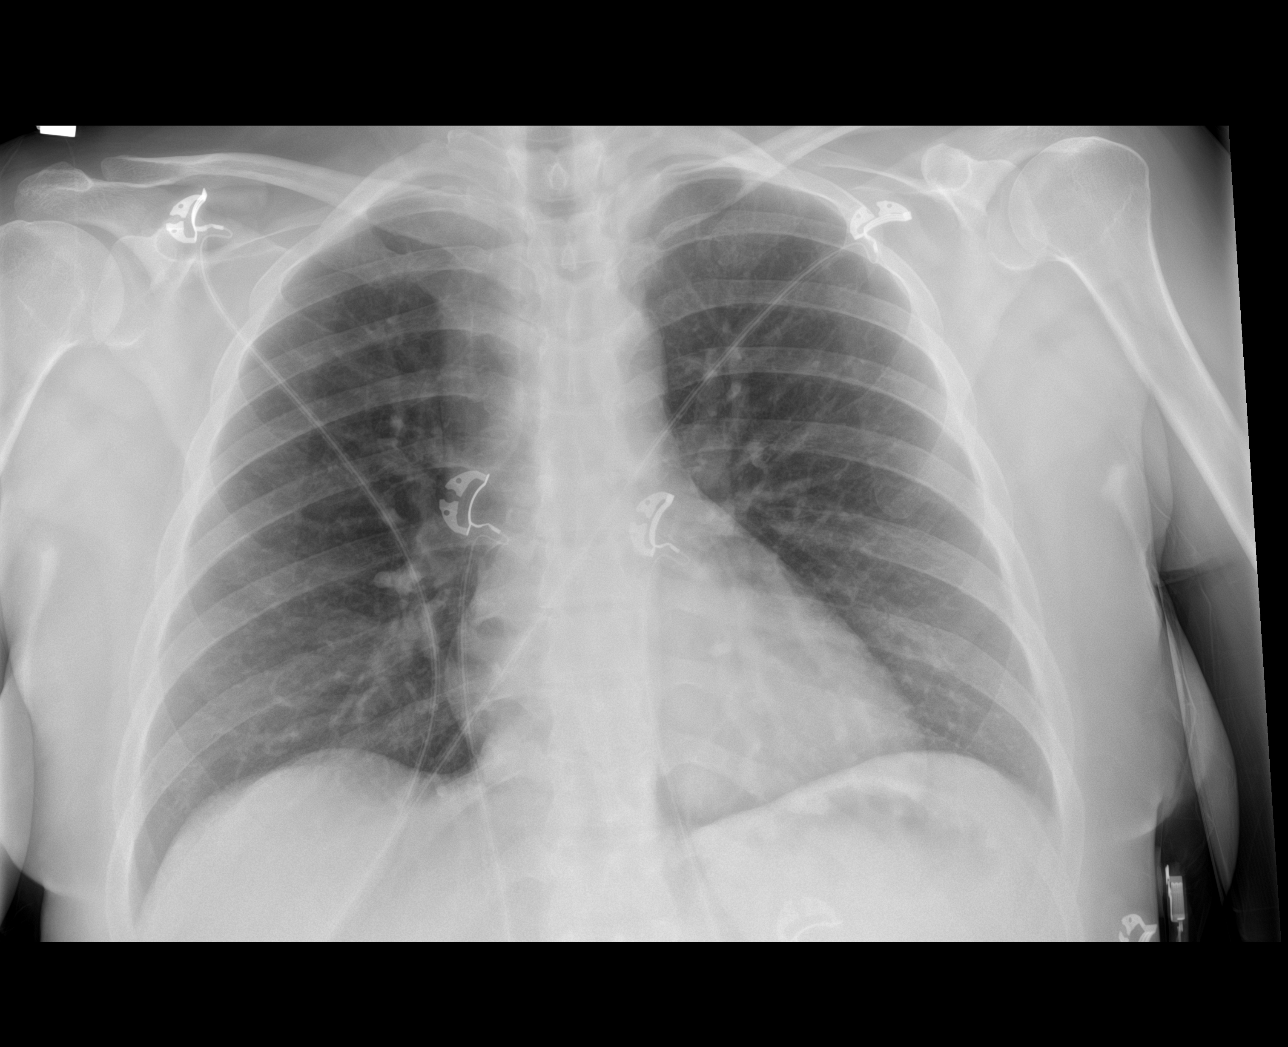

[abdomen supine (1 of 2)]
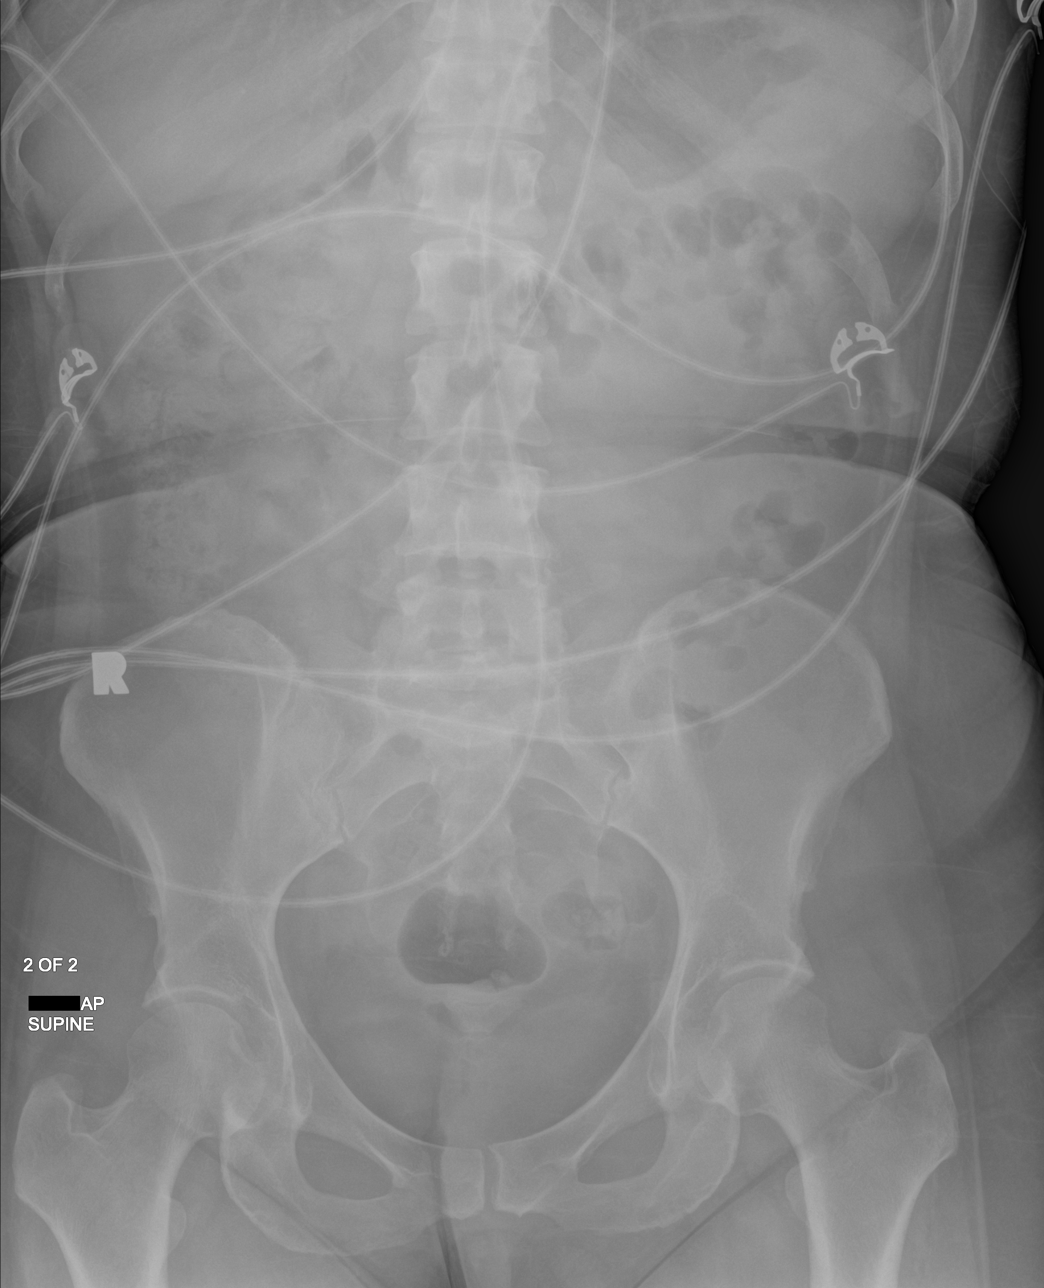

[abdomen supine (2 of 2)]
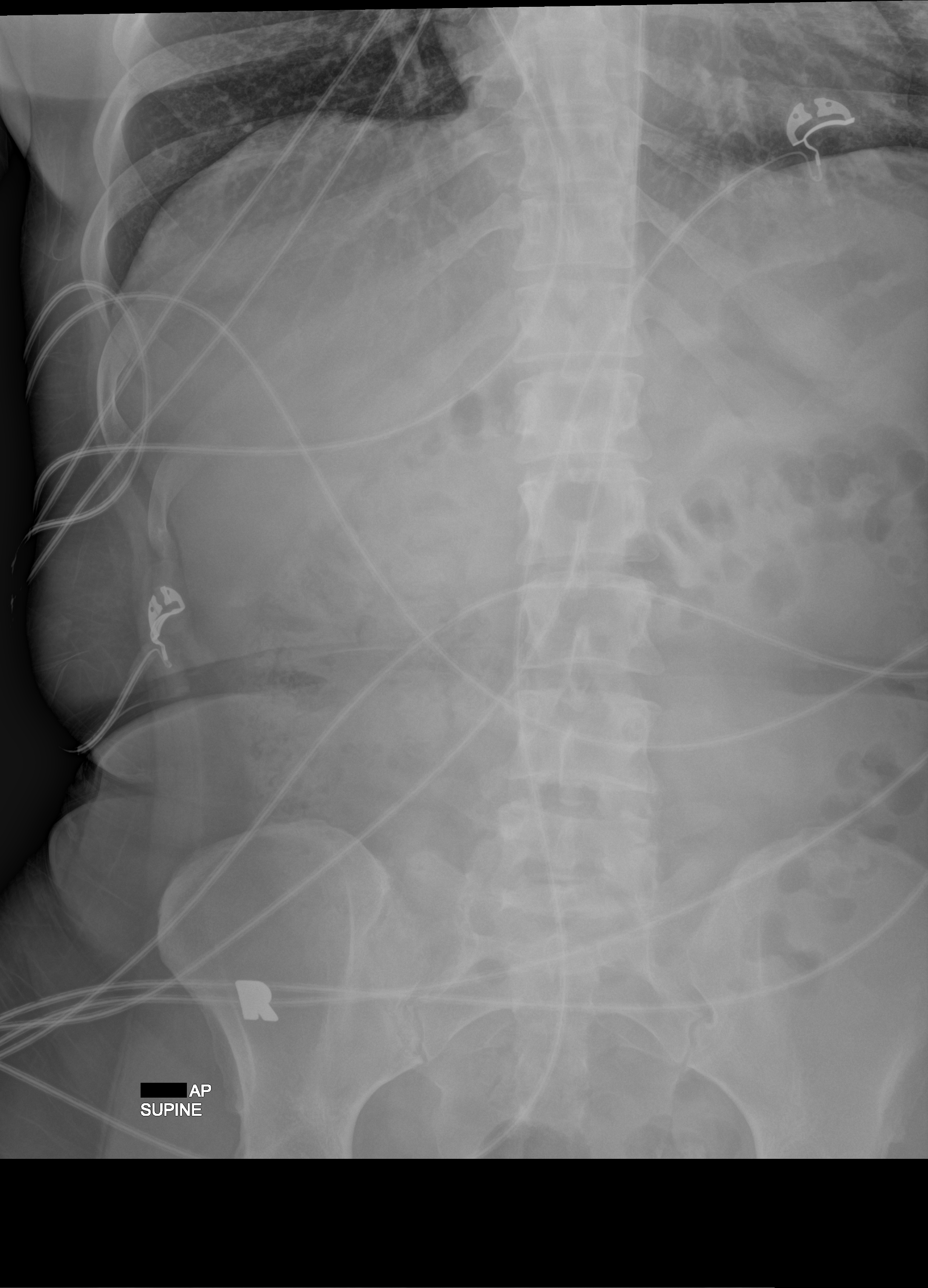

[abdomen erect]
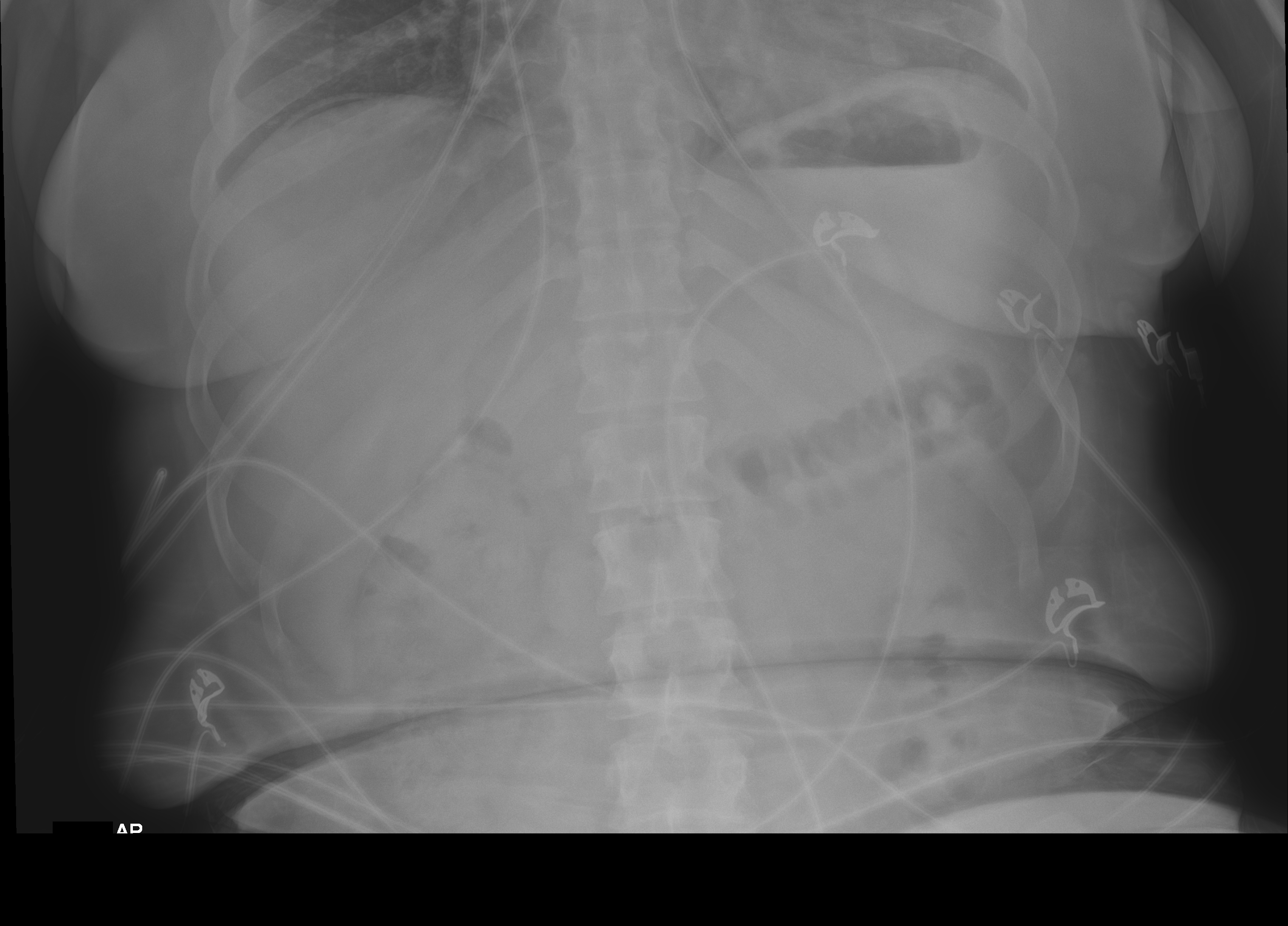

[4 of 4 positions shown; findings below may reference images not displayed]

FINDINGS: There is no evidence of dilated bowel loops. A very small amount of
free air seen just below the right hemidiaphragm. A moderate amount
of stool is seen in the ascending colon. No radiopaque calculi or
other significant radiographic abnormality is seen. Heart size and
mediastinal contours are within normal limits. Both lungs are clear.
IMPRESSION: 1. Very small amount of free air just below the right hemidiaphragm
which is likely due to recent appendectomy.
2. No acute or active cardiopulmonary disease.

## 2021-06-23 ENCOUNTER — Other Ambulatory Visit (HOSPITAL_COMMUNITY): Payer: Self-pay

## 2021-07-11 DIAGNOSIS — H10013 Acute follicular conjunctivitis, bilateral: Secondary | ICD-10-CM | POA: Insufficient documentation

## 2021-07-11 DIAGNOSIS — G43909 Migraine, unspecified, not intractable, without status migrainosus: Secondary | ICD-10-CM | POA: Insufficient documentation

## 2021-07-11 DIAGNOSIS — K5900 Constipation, unspecified: Secondary | ICD-10-CM | POA: Insufficient documentation

## 2021-07-12 DIAGNOSIS — J1189 Influenza due to unidentified influenza virus with other manifestations: Secondary | ICD-10-CM | POA: Insufficient documentation

## 2021-07-14 ENCOUNTER — Ambulatory Visit: Admission: EM | Admit: 2021-07-14 | Discharge: 2021-07-14 | Discharge status: Absent without leave | Payer: Self-pay

## 2021-07-14 ENCOUNTER — Emergency Department (HOSPITAL_COMMUNITY): Payer: No Typology Code available for payment source

## 2021-07-14 ENCOUNTER — Emergency Department (HOSPITAL_COMMUNITY)
Admission: EM | Admit: 2021-07-14 | Discharge: 2021-07-14 | Disposition: A | Discharge status: Home / Self Care | Payer: No Typology Code available for payment source | Attending: Emergency Medicine | Admitting: Emergency Medicine

## 2021-07-14 ENCOUNTER — Encounter (HOSPITAL_COMMUNITY): Payer: Self-pay | Admitting: *Deleted

## 2021-07-14 DIAGNOSIS — H1033 Unspecified acute conjunctivitis, bilateral: Secondary | ICD-10-CM | POA: Diagnosis not present

## 2021-07-14 DIAGNOSIS — Z20822 Contact with and (suspected) exposure to covid-19: Secondary | ICD-10-CM | POA: Insufficient documentation

## 2021-07-14 DIAGNOSIS — R000 Tachycardia, unspecified: Secondary | ICD-10-CM | POA: Diagnosis not present

## 2021-07-14 DIAGNOSIS — B349 Viral infection, unspecified: Secondary | ICD-10-CM

## 2021-07-14 DIAGNOSIS — H5789 Other specified disorders of eye and adnexa: Secondary | ICD-10-CM | POA: Diagnosis present

## 2021-07-14 LAB — CBC WITH DIFFERENTIAL/PLATELET
Abs Immature Granulocytes: 0.03 10*3/uL (ref 0.00–0.07)
Basophils Absolute: 0 10*3/uL (ref 0.0–0.1)
Basophils Relative: 0 %
Eosinophils Absolute: 0 10*3/uL (ref 0.0–0.5)
Eosinophils Relative: 0 %
HCT: 36.5 % (ref 36.0–46.0)
Hemoglobin: 12 g/dL (ref 12.0–15.0)
Immature Granulocytes: 0 %
Lymphocytes Relative: 9 %
Lymphs Abs: 0.8 10*3/uL (ref 0.7–4.0)
MCH: 29.6 pg (ref 26.0–34.0)
MCHC: 32.9 g/dL (ref 30.0–36.0)
MCV: 89.9 fL (ref 80.0–100.0)
Monocytes Absolute: 1 10*3/uL (ref 0.1–1.0)
Monocytes Relative: 11 %
Neutro Abs: 7.4 10*3/uL (ref 1.7–7.7)
Neutrophils Relative %: 80 %
Platelets: 214 10*3/uL (ref 150–400)
RBC: 4.06 MIL/uL (ref 3.87–5.11)
RDW: 12.4 % (ref 11.5–15.5)
WBC: 9.3 10*3/uL (ref 4.0–10.5)
nRBC: 0 % (ref 0.0–0.2)

## 2021-07-14 LAB — MONONUCLEOSIS SCREEN: Mono Screen: NEGATIVE

## 2021-07-14 LAB — COMPREHENSIVE METABOLIC PANEL
ALT: 40 U/L (ref 0–44)
AST: 43 U/L — ABNORMAL HIGH (ref 15–41)
Albumin: 3.4 g/dL — ABNORMAL LOW (ref 3.5–5.0)
Alkaline Phosphatase: 81 U/L (ref 38–126)
Anion gap: 7 (ref 5–15)
BUN: 16 mg/dL (ref 6–20)
CO2: 22 mmol/L (ref 22–32)
Calcium: 8 mg/dL — ABNORMAL LOW (ref 8.9–10.3)
Chloride: 108 mmol/L (ref 98–111)
Creatinine, Ser: 0.64 mg/dL (ref 0.44–1.00)
GFR, Estimated: >60 mL/min (ref >60)
Glucose, Bld: 90 mg/dL (ref 70–99)
Potassium: 3.7 mmol/L (ref 3.5–5.1)
Sodium: 137 mmol/L (ref 135–145)
Total Bilirubin: 0.3 mg/dL (ref 0.3–1.2)
Total Protein: 7.2 g/dL (ref 6.5–8.1)

## 2021-07-14 LAB — RESP PANEL BY RT-PCR (FLU A&B, COVID) ARPGX2
Influenza A by PCR: NEGATIVE
Influenza B by PCR: NEGATIVE
SARS Coronavirus 2 by RT PCR: NEGATIVE

## 2021-07-14 LAB — GROUP A STREP BY PCR: Group A Strep by PCR: NOT DETECTED

## 2021-07-14 MED ORDER — SODIUM CHLORIDE 0.9 % IV BOLUS
1000.0000 mL | Freq: Once | INTRAVENOUS | Status: AC
  Administered 2021-07-14: 1000 mL via INTRAVENOUS

## 2021-07-14 MED ORDER — CIPROFLOXACIN HCL 0.3 % OP SOLN: 1.0000 [drp] | OPHTHALMIC | 0 refills | Status: AC

## 2021-07-14 MED ORDER — ONDANSETRON HCL 4 MG/2ML IJ SOLN
4.0000 mg | Freq: Once | INTRAMUSCULAR | Status: AC
  Administered 2021-07-14: 4 mg via INTRAVENOUS
  Filled 2021-07-14: qty 2

## 2021-07-14 MED ORDER — ACETAMINOPHEN 325 MG PO TABS
650.0000 mg | ORAL_TABLET | Freq: Four times a day (QID) | ORAL | Status: DC | PRN
  Administered 2021-07-14: 650 mg via ORAL
  Filled 2021-07-14: qty 2

## 2021-07-14 NOTE — ED Provider Notes (Signed)
?[facility] ?Provider Note ? ? ?CSN: [id number] ?Arrival date & time: 07/14/21  1401 ? ?  ? ?History ? ?No chief complaint on file. ? ? ?Anna Willis is a 41 y.o. female. ? ?Patient complains of redness to both eyes patient reports that she had using tobramycin but this seems to cause more irritation.  Patient reports she has a sore throat and swollen glands patient reports she has had a cough and congestion.  She states that she has body aches.  Patient states that she has had constipation as well.  Patient is taking Colace without relief.  Patient reports she tried drinking coffee and taking MiraLAX to help with the constipation.  Patient reports taking Excedrin every 4 hours for body aches and pain ? ? ? ?  ? ?Home Medications ?Prior to Admission medications   ?Medication Sig Start Date End Date Taking? Authorizing Provider  ?benzonatate (TESSALON) 100 MG capsule Take 1-2 capsules (100-200 mg total) by mouth 3 (three) times daily as needed for cough. 02/02/21   Jaynee Eagles, PA-C  ?buPROPion (WELLBUTRIN XL) 300 MG 24 hr tablet Take 1 tablet (300 mg total) by mouth daily. 09/11/20     ?buPROPion (WELLBUTRIN XL) 300 MG 24 hr tablet Take 1 tablet (300 mg total) by mouth daily. 02/06/21     ?celecoxib (CELEBREX) 200 MG capsule Take 1 capsule (200 mg total) by mouth 2 (two) times daily as needed. 11/25/20     ?cetirizine (ZYRTEC ALLERGY) 10 MG tablet Take 1 tablet (10 mg total) by mouth daily. 02/02/21   Jaynee Eagles, PA-C  ?COVID-19 At Home Antigen Test [facility] COVID-19 HOME TEST) KIT Use as directed. 11/26/20   Jefm Bryant, RPH  ?Erenumab-aooe (AIMOVIG [place]) Inject 1 Dose into the skin every 30 (thirty) days.    [provider]  ?Eduard Roux (AIMOVIG) 55 MG/ML SOAJ Inject 1 millilter subcutaneous  in the abdomen, thigh or outer area of upper arm once a month 08/07/20     ?Erenumab-aooe (AIMOVIG) 70 MG/ML SOAJ Inject 70 mg into the skin once a month 02/06/21     ?esomeprazole (NEXIUM) 20  MG capsule Take 20 mg by mouth daily before breakfast.     [provider]  ?metoprolol succinate (TOPROL-XL) 25 MG 24 hr tablet TAKE 1.5 TABLETS (37.5 MG TOTAL) BY MOUTH DAILY. 05/16/20 05/16/21  Erma Heritage, PA-C  ?metoprolol succinate (TOPROL-XL) 25 MG 24 hr tablet TAKE 1 TABLET BY MOUTH ONCE DAILY. 05/06/20 05/06/21  Jake Samples, PA-C  ?metoprolol succinate (TOPROL-XL) 25 MG 24 hr tablet Take 1 tablet (25 mg total) by mouth daily. 09/11/20     ?metoprolol succinate (TOPROL-XL) 25 MG 24 hr tablet Take 1 tablet (25 mg total) by mouth daily. 02/06/21     ?ondansetron (ZOFRAN) 4 MG tablet Take 1 tablet (4 mg total) by mouth every 6 (six) hours. 04/23/21     ?pantoprazole (PROTONIX) 40 MG tablet Take 1 tablet (40 mg total) by mouth 2 (two) times daily. 04/23/21     ?promethazine-dextromethorphan (PROMETHAZINE-DM) 6.25-15 MG/5ML syrup Take 5 mLs by mouth at bedtime as needed for cough. 02/02/21   Jaynee Eagles, PA-C  ?pseudoephedrine (SUDAFED) 30 MG tablet Take 1 tablet (30 mg total) by mouth every 8 (eight) hours as needed for congestion. 02/02/21   Jaynee Eagles, PA-C  ?SUMAtriptan (IMITREX) 100 MG tablet Take 50 mg by mouth every 2 (two) hours as needed for migraine or headache. 12/12/18   [provider]  ?SUMAtriptan Dellis Filbert)  100 MG tablet TAKE 1/2 TO 1 TABLET BY MOUTH AFTER ONSET OF MIGRAINE; MAY REPEAT AFTER 2 HOURS IF HEADACHE RETURNS, NOT TO EXCEED 200MG IN 24HRS 02/06/20 02/05/21  Jake Samples, PA-C  ?SUMAtriptan (IMITREX) 100 MG tablet Take 1/2 to 1 tablet by mouth after onset of migraine; May repeat after 2 hours if headache returns, not to exceed 211m in 24 hours 03/26/21     ?   ? ?Allergies    ?Amoxicillin, Rocephin [ceftriaxone], Succinylcholine, and Erythromycin   ? ?Review of Systems   ?Review of Systems  ?Constitutional:  Positive for chills, fatigue and fever.  ?HENT:  Positive for congestion and sore throat.   ?Eyes:  Positive for pain and redness.  ?Respiratory:   Positive for cough.   ?Gastrointestinal:  Positive for constipation.  ?Musculoskeletal:  Positive for arthralgias.  ?Neurological:  Positive for headaches.  ?All other systems reviewed and are negative. ? ?Physical Exam ?Updated Vital Signs ?BP 94/72 (BP Location: Left Arm)   Pulse (!) 122   Temp 99.6 ?F (37.6 ?C) (Oral)   Resp 18   Ht 5' (1.524 m)   Wt 69.9 kg   LMP 09/27/2019   SpO2 100%   BMI 30.08 kg/m?  ?Physical Exam ?Vitals and nursing note reviewed.  ?Constitutional:   ?   Appearance: Normal appearance. She is well-developed.  ?HENT:  ?   Head: Normocephalic and atraumatic.  ?   Right Ear: Tympanic membrane normal.  ?   Left Ear: Tympanic membrane normal.  ?   Nose: Congestion present.  ?   Mouth/Throat:  ?   Pharynx: Posterior oropharyngeal erythema present.  ?Eyes:  ?   Extraocular Movements: Extraocular movements intact.  ?   Pupils: Pupils are equal, round, and reactive to light.  ?   Comments: Injected bilat conjunctivitis   ?Cardiovascular:  ?   Rate and Rhythm: Tachycardia present.  ?Pulmonary:  ?   Effort: Pulmonary effort is normal.  ?Abdominal:  ?   General: Abdomen is flat. There is no distension.  ?Musculoskeletal:     ?   General: Normal range of motion.  ?   Cervical back: Normal range of motion.  ?Skin: ?   General: Skin is warm.  ?Neurological:  ?   General: No focal deficit present.  ?   Mental Status: She is alert and oriented to person, place, and time.  ?Psychiatric:     ?   Mood and Affect: Mood normal.  ? ? ?ED Results / Procedures / Treatments   ?Labs ?(all labs ordered are listed, but only abnormal results are displayed) ?Labs Reviewed  ?COMPREHENSIVE METABOLIC PANEL - Abnormal; Notable for the following components:  ?    Result Value  ? Calcium 8.0 (*)   ? Albumin 3.4 (*)   ? AST 43 (*)   ? All other components within normal limits  ?RESP PANEL BY RT-PCR (FLU A&B, COVID) ARPGX2  ?GROUP A STREP BY PCR  ?CBC WITH DIFFERENTIAL/PLATELET  ?MONONUCLEOSIS SCREEN   ? ? ?EKG ?None ? ?Radiology ?DG Chest Port 1 View ? ?Result Date: 07/14/2021 ?CLINICAL DATA:  Cough, nausea and constipation. EXAM: PORTABLE CHEST 1 VIEW COMPARISON:  CT January 23, 2020 FINDINGS: The heart size and mediastinal contours are within normal limits. No focal airspace consolidation. No visible pleural effusion or pneumothorax. The visualized skeletal structures are unremarkable. IMPRESSION: No acute cardiopulmonary disease. Electronically Signed   By: JDahlia BailiffM.D.   On: 07/14/2021 18:44   ? ?Procedures ?  Procedures  ? ? ?Medications Ordered in ED ?Medications  ?acetaminophen (TYLENOL) tablet 650 mg (650 mg Oral Given 07/14/21 2051)  ?sodium chloride 0.9 % bolus 1,000 mL (0 mLs Intravenous Stopped 07/14/21 2039)  ?ondansetron [facility]) injection 4 mg (4 mg Intravenous Given 07/14/21 2051)  ? ? ?ED Course/ Medical Decision Making/ A&P ?  ?                        ?Medical Decision Making ?Patient concerned that she could have mono or strep throat patient is requesting a monotest and strep test. ? ?Amount and/or Complexity of Data Reviewed ?External Data Reviewed: notes. ?   Details: Cardiology notes reviewed patient has a history of palpitations, managed with metoprolol ?Labs: ordered. Decision-making details documented in ED Course. ?   Details: COVID and influenza are negative, screen is negative monotest is negative complete blood cell count shows a normal hemoglobin patient has a normal white blood cell count chemistries are unremarkable ?Radiology: ordered and independent interpretation performed. Decision-making details documented in ED Course. ?   Details: Chest x-ray is reviewed radiologist reports no acute abnormality ? ?Risk ?OTC drugs. ?Prescription drug management. ?Risk Details: Patient given IV fluids x1 L patient is given Zofran IV and Tylenol for discomfort.  Patient reports that she is feeling some better.  Patient does have tachycardia.  Suspect tachycardia secondary to Excedrin with  caffeine and coffee.  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  Advised patient to continue oral fluids to stop Excedrin and caffeinated beverages.  Patient is advised to take Tylenol rather than Excedrin.  Patien

## 2021-07-14 NOTE — Discharge Instructions (Addendum)
Stop excedrin and caffeine.  Take your metoprolol as directed.  See your Md for recheck in 2 days if not improving  ?

## 2021-07-14 NOTE — ED Triage Notes (Signed)
Multiple complaints, redness of eyes with drainage, constipation x 1 week, , nausea ?

## 2021-07-24 ENCOUNTER — Other Ambulatory Visit (HOSPITAL_COMMUNITY): Payer: Self-pay

## 2021-08-19 ENCOUNTER — Other Ambulatory Visit (HOSPITAL_COMMUNITY): Payer: Self-pay

## 2021-08-20 ENCOUNTER — Other Ambulatory Visit (HOSPITAL_COMMUNITY): Payer: Self-pay

## 2021-09-01 ENCOUNTER — Other Ambulatory Visit (HOSPITAL_COMMUNITY): Payer: Self-pay

## 2021-09-03 ENCOUNTER — Other Ambulatory Visit (HOSPITAL_COMMUNITY): Payer: Self-pay

## 2021-09-16 ENCOUNTER — Other Ambulatory Visit (HOSPITAL_COMMUNITY): Payer: Self-pay

## 2021-09-18 ENCOUNTER — Other Ambulatory Visit (HOSPITAL_COMMUNITY): Payer: Self-pay

## 2021-09-20 NOTE — Progress Notes (Unsigned)
NEUROLOGY CONSULTATION NOTE  Anna Willis MRN: [id number] DOB: May 04, 1980  Referring provider: [facility] Primary care provider: [facility]  Reason for consult:  migraines, neurofibromatosis  Assessment/Plan:   Migraine without aura, without status migrainosus, not intractable Neurofibromatosis, type 1 - no objective abnormalities on exam to warrant imaging at this time. Positional vertigo - since adenovirus infection.  Symptoms seem peripheral.  Do not suspect central etiology at this time. Right sided jaw pain - consider TMJ dysfunction  Refer to vestibular rehab.  If ineffective, then would check MRI of brain with and without contrast    Migraine prevention:  Aimovig 10m every 28 days Migraine rescue:  stop sumatriptan.  She will try rizatriptan 128m  Zofran for nausea Limit use of pain relievers to no more than 2 days out of week to prevent risk of rebound or medication-overuse headache. Keep headache diary Recommend continue annual eye exam Follow up 4 months.    Subjective:  Anna Willis a [age]ear old right-handed female who presents for Neurofibromatosis and migraines.  History supplemented by referring provider's note.  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  She was diagnosed with Neurofibromatosis type 1 as a child.  Father has it as well.  She exhibits cafe au lait spots and subcutaneous neurofibromas.  Overall no symptoms.  Legs always feel fatigued with decreased tone but no significant weakness, pain, visual disturbance.  She does get annual eye exams.  She does report some low back pain, no radiation down the legs.    She has history of migraines since childhood.  They particularly became frequent around 2015.  In 2016, she was noted to have elevated prolactin levels.  MRI of brain and pituitary gland with and without contrast on 01/08/2015, personally reviewed, was  normal and revealed no pituitary lesion.  Migraine are severe right -sided pounding headache/retro-orbital.  No preceding aura.  Associated with nausea, photophobia, phonophobia, osmophobia.  No vomiting, visual disturbance, numbness or weakness.  Migraines usually last 3 hours off and on for about 3 days and occur 2-3 a month since being on Aimovig (used to be 12-15 days a month).  Triggers include heat, strong smells (candles, soaps, perfumes), emotional stress, loud noise exposure.  Also has tension type bandlike.  They occur 2-3 times a month but will last most of the day.  Usually treats with Excedrin.  She has been experiencing positional dizziness since having adenovirus infection 6 weeks ago.  She had associated conjunctivitis.  She was quite dizzy at that time.  It is a sensation of room or self movement.  She has not fallen at work but is afraid that she might.  They last 10 seconds.  Triggered by changing positions such as bending over or looking up to reach for anything.  She also notes that Excedrin triggers the dizziness.  She does have a shooting pain up the right side of her jaw. Her jaw does typically "pop".  And in her ear  Saw dentist who told her that she "bruised her tooth.  Rescue protocol:  sumatriptan 4114mirst line, Excedrin second line - migraines usually return afterwards. Current NSAIDS/analgesics:  Excedrin, Celebrex (for bodyaches) Current triptans:  sumatriptan 100m13mrrent ergotamine:  none Current anti-emetic:  Zofran 4mg 29mrent muscle relaxants:  none Current Antihypertensive medications:  metoprolol Current Antidepressant medications:  Wellbutrin 300mg 12ment Anticonvulsant medications:  none Current anti-CGRP:  Aimovig 70mg e64m 28d Current Vitamins/Herbal/Supplements:  none Current Antihistamines/Decongestants:  Benadryl Other therapy:  none Birth control:  none  Past NSAIDS/analgesics:  ibuprofen, Tylenol Past abortive triptans:  none Past abortive  ergotamine:  none Past muscle relaxants:  Flexeril, Zanflex Past anti-emetic:  promethazine Past antihypertensive medications:  none Past antidepressant medications:  venlafaxine (side effects), sertraline Past anticonvulsant medications:  none Past anti-CGRP:  none Other past therapies:  none  Caffeine:  6 oz coffee daily.  Rarely tea. No more than 1 cola or Dr. Malachi Bonds a day. Diet:  Hydrates.  Sometimes Sprite in addition to up Depression:  yes; Anxiety:  yes Other pain:  sometimes low back pain, bodyaches Sleep hygiene:  Needs to take Benadryl Family history:  Father (NF 1), mother (epilepsy)      PAST MEDICAL HISTORY: Past Medical History:  Diagnosis Date   Acid reflux    Anxiety    Complication of anesthesia    muscle weakness after surgery      Migraine    Neurofibromatosis ([place])    Scoliosis    Seasonal allergies    Tachycardia     PAST SURGICAL HISTORY: Past Surgical History:  Procedure Laterality Date   ABDOMINAL HYSTERECTOMY  10/03/2019   Procedure: TOTAL ABDOMINAL HYSTERECTOMY;  Surgeon: Florian Buff, MD;  Location: [facility];  Service: Gynecology;;   BILATERAL SALPINGECTOMY  10/03/2019   Procedure: OPEN BILATERAL SALPINGECTOMY;  Surgeon: Florian Buff, MD;  Location: [facility];  Service: Gynecology;;   DILATION AND CURETTAGE OF UTERUS     LAPAROSCOPIC APPENDECTOMY N/A 07/18/2019   Procedure: APPENDECTOMY LAPAROSCOPIC;  Surgeon: Virl Cagey, MD;  Location: [facility];  Service: General;  Laterality: N/A;   TOE AMPUTATION     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    [place]: Current Outpatient Medications on File Prior to Visit  Medication Sig Dispense Refill   benzonatate (TESSALON) 100 MG capsule Take 1-2 capsules (100-200 mg total) by mouth 3 (three) times daily as needed for cough. 60 capsule 0   buPROPion (WELLBUTRIN XL) 300 MG 24 hr tablet Take 1 tablet (300 mg total) by mouth daily. 30 tablet 5   buPROPion (WELLBUTRIN XL)  300 MG 24 hr tablet Take 1 tablet (300 mg total) by mouth daily. 90 tablet 3   celecoxib (CELEBREX) 200 MG capsule Take 1 capsule (200 mg total) by mouth 2 (two) times daily as needed. 60 capsule 0   cetirizine (ZYRTEC ALLERGY) 10 MG tablet Take 1 tablet (10 mg total) by mouth daily. 30 tablet 0   COVID-19 At Home Antigen Test (CARESTART COVID-19 HOME TEST) KIT Use as directed. 2 each 0   Erenumab-aooe (AIMOVIG [place]) Inject 1 Dose into the skin every 30 (thirty) days.     Erenumab-aooe (AIMOVIG) 70 MG/ML SOAJ Inject 1 millilter subcutaneous  in the abdomen, thigh or outer area of upper arm once a month 3 mL 3   Erenumab-aooe (AIMOVIG) 70 MG/ML SOAJ Inject 70 mg into the skin once a month 1 mL 11   esomeprazole (NEXIUM) 20 MG capsule Take 20 mg by mouth daily before breakfast.      metoprolol succinate (TOPROL-XL) 25 MG 24 hr tablet TAKE 1.5 TABLETS (37.5 MG TOTAL) BY MOUTH DAILY. 135 tablet 3   metoprolol succinate (TOPROL-XL) 25 MG 24 hr tablet TAKE 1 TABLET BY MOUTH ONCE DAILY. 90 tablet 0   metoprolol succinate (TOPROL-XL) 25 MG 24 hr tablet Take 1 tablet (25 mg total) by mouth  daily. 90 tablet 3   metoprolol succinate (TOPROL-XL) 25 MG 24 hr tablet Take 1 tablet (25 mg total) by mouth daily. 90 tablet 3   ondansetron (ZOFRAN) 4 MG tablet Take 1 tablet (4 mg total) by mouth every 6 (six) hours. 40 tablet 9   pantoprazole (PROTONIX) 40 MG tablet Take 1 tablet (40 mg total) by mouth 2 (two) times daily. 60 tablet 11   promethazine-dextromethorphan (PROMETHAZINE-DM) 6.25-15 MG/5ML syrup Take 5 mLs by mouth at bedtime as needed for cough. 100 mL 0   pseudoephedrine (SUDAFED) 30 MG tablet Take 1 tablet (30 mg total) by mouth every 8 (eight) hours as needed for congestion. 30 tablet 0   SUMAtriptan (IMITREX) 100 MG tablet Take 50 mg by mouth every 2 (two) hours as needed for migraine or headache.     SUMAtriptan (IMITREX) 100 MG tablet TAKE 1/2 TO 1 TABLET BY MOUTH AFTER ONSET OF MIGRAINE; MAY REPEAT  AFTER 2 HOURS IF HEADACHE RETURNS, NOT TO EXCEED 200MG IN 24HRS 9 tablet 5   SUMAtriptan (IMITREX) 100 MG tablet Take 1/2 to 1 tablet by mouth after onset of migraine; May repeat after 2 hours if headache returns, not to exceed 271m in 24 hours 9 tablet 5   No current facility-administered medications on file prior to visit.    ALLERGIES: Allergies  Allergen Reactions   Amoxicillin Nausea And Vomiting    Projectile vomiting   Rocephin [Ceftriaxone] Nausea And Vomiting   Succinylcholine Other (See Comments)    Muscle weakness/muscle pain.   Erythromycin Rash    FAMILY HISTORY: Family History  Problem Relation Age of Onset   Migraines Mother    Epilepsy Mother    Endometriosis Mother        had hyst at age 41  Neurofibromatosis Father    Dementia Maternal Grandmother    Kidney disease Maternal Grandmother    Other Maternal Grandmother        shingles   Heart attack Maternal Grandfather    Breast cancer Paternal Aunt    Breast cancer Paternal Aunt    Breast cancer Paternal Aunt     Objective:  Blood pressure 111/75, pulse 93, height 5' 1"  (1.549 m), weight 155 lb 9.6 oz (70.6 kg), last menstrual period 09/27/2019, SpO2 98 %. General: No acute distress.  Patient appears well-groomed.   Head:  Normocephalic/atraumatic Eyes:  fundi examined but not visualized Neck: supple, no paraspinal tenderness, full range of motion Back: No paraspinal tenderness Heart: regular rate and rhythm Lungs: Clear to auscultation bilaterally. Vascular: No carotid bruits. Neurological Exam: Mental status: alert and oriented to person, place, and time, recent and remote memory intact, fund of knowledge intact, attention and concentration intact, speech fluent and not dysarthric, language intact. Cranial nerves: CN I: not tested CN II: pupils equal, round and reactive to light, visual fields intact CN III, IV, VI:  full range of motion, no nystagmus, no ptosis CN V: facial sensation intact. CN  VII: upper and lower face symmetric CN VIII: hearing intact CN IX, X: gag intact, uvula midline CN XI: sternocleidomastoid and trapezius muscles intact CN XII: tongue midline Bulk & Tone: normal, no fasciculations. Motor:  muscle strength 5/5 throughout Sensation:  Pinprick, temperature and vibratory sensation intact. Deep Tendon Reflexes:  2+ throughout,  toes downgoing.   Finger to nose testing:  Without dysmetria.   Heel to shin:  Without dysmetria.   Gait:  Normal station and stride.  Romberg negative.    Thank you  for allowing me to take part in the care of this patient.  Metta Clines, DO  CC: [facility]

## 2021-09-21 ENCOUNTER — Encounter: Payer: Self-pay | Admitting: Neurology

## 2021-09-21 ENCOUNTER — Ambulatory Visit (INDEPENDENT_AMBULATORY_CARE_PROVIDER_SITE_OTHER): Payer: No Typology Code available for payment source | Admitting: Neurology

## 2021-09-21 ENCOUNTER — Other Ambulatory Visit (HOSPITAL_COMMUNITY): Payer: Self-pay

## 2021-09-21 VITALS — BP 111/75 | HR 93 | Ht 61.0 in | Wt 155.6 lb

## 2021-09-21 DIAGNOSIS — Q8501 Neurofibromatosis, type 1: Secondary | ICD-10-CM

## 2021-09-21 DIAGNOSIS — H8110 Benign paroxysmal vertigo, unspecified ear: Secondary | ICD-10-CM | POA: Diagnosis not present

## 2021-09-21 DIAGNOSIS — R6884 Jaw pain: Secondary | ICD-10-CM

## 2021-09-21 DIAGNOSIS — G43009 Migraine without aura, not intractable, without status migrainosus: Secondary | ICD-10-CM

## 2021-09-21 MED ORDER — RIZATRIPTAN BENZOATE 10 MG PO TBDP
10.0000 mg | ORAL_TABLET | ORAL | 5 refills | Status: DC | PRN
  Filled 2021-09-21 – 2021-11-11 (×3): qty 9, 30d supply, fill #0
  Filled 2022-01-22: qty 9, 30d supply, fill #1

## 2021-09-21 MED ORDER — AIMOVIG 70 MG/ML ~~LOC~~ SOAJ
70.0000 mg | SUBCUTANEOUS | 1 refills | Status: DC
  Filled 2021-09-21: qty 3, 84d supply, fill #0
  Filled 2021-10-28: qty 1, 30d supply, fill #0
  Filled 2021-11-11 – 2021-11-22 (×2): qty 1, 30d supply, fill #1
  Filled 2021-12-28: qty 1, 30d supply, fill #2
  Filled 2022-01-22: qty 1, 30d supply, fill #3

## 2021-09-21 NOTE — Patient Instructions (Signed)
  Refer to vestibular rehab Aimovig 70mg  every 28 days Take rizatriptan 10mg  at earliest onset of headache.  May repeat dose once in 2 hours if needed.  Maximum 2 tablets in 24 hours.  STOP SUMATRIPTAN Limit use of pain relievers to no more than 2 days out of the week.  These medications include acetaminophen, NSAIDs (ibuprofen/Advil/Motrin, naproxen/Aleve, triptans (Imitrex/sumatriptan), Excedrin, and narcotics.  This will help reduce risk of rebound headaches. Be aware of common food triggers:  - Caffeine:  coffee, black tea, cola, Mt. Dew  - Chocolate  - Dairy:  aged cheeses (brie, blue, cheddar, gouda, [place], provolone, [place], Swiss, etc), chocolate milk, buttermilk, sour cream, limit eggs and yogurt  - Nuts, peanut butter  - Alcohol  - Cereals/grains:  FRESH breads (fresh bagels, sourdough, doughnuts), yeast productions  - Processed/canned/aged/cured meats (pre-packaged deli meats, hotdogs)  - MSG/glutamate:  soy sauce, flavor enhancer, pickled/preserved/marinated foods  - Sweeteners:  aspartame (Equal, Nutrasweet).  Sugar and Splenda are okay  - Vegetables:  legumes (lima beans, lentils, snow peas, fava beans, pinto peans, peas, garbanzo beans), sauerkraut, onions, olives, pickles  - Fruit:  avocados, bananas, citrus fruit (orange, lemon, grapefruit), mango  - Other:  Frozen meals, macaroni and cheese Routine exercise Stay adequately hydrated (aim for 64 oz water daily) Keep headache diary Maintain proper stress management Maintain proper sleep hygiene Do not skip meals Consider supplements:  magnesium citrate 400mg  daily, riboflavin 400mg  daily, coenzyme Q10 100mg  three times daily.

## 2021-10-01 ENCOUNTER — Other Ambulatory Visit (HOSPITAL_COMMUNITY): Payer: Self-pay

## 2021-10-19 NOTE — Therapy (Signed)
OUTPATIENT PHYSICAL THERAPY VESTIBULAR EVALUATION     Patient Name: Anna Willis MRN: [id number] DOB:03-05-1981, 41 y.o., female Today's Date: 10/20/2021  PCP:  [facility]    REFERRING PROVIDER:   Drema Dallas, DO     PT End of Session - 10/20/21 0859     Visit Number 1    Number of Visits 3    Date for PT Re-Evaluation 11/10/21    Authorization Type [place] [employer]    Authorization - Visit Number 1    Progress Note Due on Visit 3    PT Start Time 0815    PT Stop Time 0858    PT Time Calculation (min) 43 min             Past Medical History:  Diagnosis Date   Acid reflux    Anxiety    Complication of anesthesia    muscle weakness after surgery      Migraine    Neurofibromatosis (H[facility])    Scoliosis    Seasonal allergies    Tachycardia    Past Surgical History:  Procedure Laterality Date   ABDOMINAL HYSTERECTOMY  10/03/2019   Procedure: TOTAL ABDOMINAL HYSTERECTOMY;  Surgeon: [name], MD;  Location: [facility];  Service: Gynecology;;   BILATERAL SALPINGECTOMY  10/03/2019   Procedure: OPEN BILATERAL SALPINGECTOMY;  Surgeon: [name], MD;  Location: [facility];  Service: Gynecology;;   DILATION AND CURETTAGE OF UTERUS     LAPAROSCOPIC APPENDECTOMY N/A 07/18/2019   Procedure: APPENDECTOMY LAPAROSCOPIC;  Surgeon: Lucretia Roers, MD;  Location: [facility];  Service: General;  Laterality: N/A;   TOE AMPUTATION     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    WISDOM TOOTH EXTRACTION     Patient Active Problem List   Diagnosis Date Noted   S/P hysterectomy 10/03/2019   Acute appendicitis    Abdominal pain 07/17/2019   Neurofibromatosis syndrome (HCC) 01/24/2019   Obesity with body mass index 30 or greater 01/24/2019   Menorrhagia 11/13/2018   Dysmenorrhea 03/24/2018    ONSET DATE: 2 months  REFERRING DIAG: H81.10 (ICD-10-CM) - Benign paroxysmal positional vertigo, unspecified laterality   THERAPY DIAG:  Dizziness and  giddiness  Neurofibromatosis (HCC)  Chronic migraine without aura without status migrainosus, not intractable  Rationale for Evaluation and Treatment Rehabilitation  SUBJECTIVE:   SUBJECTIVE STATEMENT: Random bouts of vertigo.  About 2 months ago had adenovirus; conjunctivitis; and vertigo.  Much better now but occasionally still has some vertigo with position changes in bed, bending over, turning too quick; picking up something off the floor. Dr. Does not think related to neurofibromatosis. 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  Pt accompanied by: self  PERTINENT HISTORY: Migraine without aura, without status migrainosus, not intractable Neurofibromatosis, type 1 - no objective abnormalities on exam to warrant imaging at this time. Positional vertigo - since adenovirus infection.  Symptoms seem peripheral.  Do not suspect central etiology at this time. Right sided jaw pain - consider TMJ dysfunction     PAIN:  yes but no pain at this moment Are you having pain? Yes: NPRS scale: 0/10 Pain location: legs and back; neurofibrosis Pain description: aching throbbing, sore Aggravating factors: activity Relieving factors: rest  PRECAUTIONS: Fall  WEIGHT BEARING RESTRICTIONS No  FALLS: Has patient fallen in last 6 months? No    PLOF: Independent  PATIENT GOALS no vertigo  OBJECTIVE:   DIAGNOSTIC FINDINGS: 41 year old female with nausea and vomiting  following injury yesterday. Initial encounter.   EXAM: CT HEAD WITHOUT CONTRAST   TECHNIQUE: Contiguous axial images were obtained from the base of the skull through the vertex without intravenous contrast.   COMPARISON:  01/08/2015 MR   FINDINGS: Brain: No evidence of acute infarction, hemorrhage, hydrocephalus, extra-axial collection or mass lesion/mass effect.   Vascular: No hyperdense vessel or unexpected calcification.   Skull: Normal. Negative for fracture or focal lesion.   Sinuses/Orbits: No acute  finding.   Other: None.   IMPRESSION: Unremarkable noncontrast head CT.      COGNITION: Overall cognitive status: Within functional limits for tasks assessed     POSTURE: No Significant postural limitations   Cervical ROM:    Active A/PROM (deg) eval  Flexion Wfl throughout  Extension   Right lateral flexion   Left lateral flexion   Right rotation   Left rotation   (Blank rows = not tested)  STRENGTH: appears wfl grossly throughout  BED MOBILITY:  Sit to supine Complete Independence Supine to sit Complete Independence  TRANSFERS: Assistive device utilized: None  Sit to stand: Complete Independence Stand to sit: Complete Independence Chair to chair: Complete Independence  GAIT: Gait pattern: WFL Distance walked: 120 ft Assistive device utilized: None Level of assistance: Complete Independence Comments: no gait abnormalities noted   PATIENT SURVEYS:  FOTO 48   VESTIBULAR ASSESSMENT   GENERAL OBSERVATION: glasses    SYMPTOM BEHAVIOR:   Subjective history: see above   Non-Vestibular symptoms: headaches and migraine symptoms   Type of dizziness: Spinning/Vertigo and Unsteady with head/body turns   Frequency: occassionally   Duration: very short term   Aggravating factors: Induced by position change: head movement and Induced by motion: looking up at the ceiling, bending down to the ground, turning body quickly, and turning head quickly   Relieving factors: head stationary   Progression of symptoms: better   OCULOMOTOR EXAM:   Ocular Alignment: normal   Ocular ROM: No Limitations    Spontaneous Nystagmus: absent   Gaze-Induced Nystagmus: absent   Smooth Pursuits: intact   Saccades: intact       VESTIBULAR - OCULAR REFLEX:    Slow VOR: Normal    POSITIONAL TESTING: Right Dix-Hallpike: no nystagmus Left Dix-Hallpike: no nystagmus Right Roll Test: no nystagmus Left Roll Test: no nystagmus Right Sidelying: no nystagmus Left Sidelying: no  nystagmus    MOTION SENSITIVITY:    Motion Sensitivity Quotient  Intensity: 0 = none, 1 = Lightheaded, 2 = Mild, 3 = Moderate, 4 = Severe, 5 = Vomiting  Intensity  1. Sitting to supine 0  2. Supine to L side   3. Supine to R side   4. Supine to sitting 1  5. L Hallpike-Dix 0  6. Up from L  1  7. R Hallpike-Dix 0  8. Up from R    9. Sitting, head  tipped to L knee   10. Head up from L  knee   11. Sitting, head  tipped to R knee   12. Head up from R  knee   13. Sitting head turns x5   14.Sitting head nods x5   15. In stance, 180  turn to L    16. In stance, 180  turn to R       VESTIBULAR TREATMENT:  Canalith Repositioning:   Comment: did not perform no positive testing  Habituation:   Brandt-Daroff: number of reps: 1 Other: gave Austin Miles for HEP  PATIENT EDUCATION:  Education details:  Patient educated on exam findings, POC, scope of PT, HEP, and education on BPPV. Person educated: Patient Education method: Explanation, Demonstration, and Handouts Education comprehension: verbalized understanding, returned demonstration, verbal cues required, and tactile cues required    GOALS: Goals reviewed with patient? No  SHORT TERM GOALS: Target date: 11/10/2021    patient will be independent with initial HEP  Baseline: Goal status: INITIAL  2.  Patient will perform all bed mobility without any dizziness Baseline:  Goal status: INITIAL  3.   Patient will improve FOTO score to predicted value to demonstrate improved functional mobility. Baseline: 48 Goal status: INITIAL  4.  Patient will be independent in self management strategies to improve quality of life and functional outcomes.  Baseline:  Goal status: INITIAL  5.  Patient will report at least 50% improvement in overall symptoms and/or function to demonstrate improved functional mobility  Baseline:  Goal status: INITIAL   ASSESSMENT:  CLINICAL IMPRESSION: Patient is a 41 y.o. Female who  was seen today for physical therapy evaluation and treatment for H81.10 (ICD-10-CM) - Benign paroxysmal positional vertigo, unspecified laterality . She did not have any positive positional testing today, no nystagmus,although she has very brief dizziness when sitting up quickly.  Patient given Austin Miles exercises for habituation at home for HEP.  She states that prior to today these positional tests would have made her dizzy but has improved over the past few weeks.  Dizziness adversely affects her bed mobility, and bending down and looking up to perform tasks at work and home. Patient will benefit from skilled therapy interventions to address deficits and promote optimal function.    OBJECTIVE IMPAIRMENTS decreased activity tolerance, decreased knowledge of condition, dizziness, impaired perceived functional ability, and pain.   ACTIVITY LIMITATIONS carrying, lifting, bending, bed mobility, reach over head, and caring for others  PARTICIPATION LIMITATIONS: meal prep, cleaning, laundry, community activity, and occupation  REHAB POTENTIAL: Good  CLINICAL DECISION MAKING: Evolving/moderate complexity  EVALUATION COMPLEXITY: Moderate   PLAN: PT FREQUENCY: 1x/week  PT DURATION: 2 weeks  PLANNED INTERVENTIONS: Therapeutic exercises, Therapeutic activity, Neuromuscular re-education, Balance training, Gait training, Patient/Family education, Joint manipulation, Joint mobilization, Stair training, Orthotic/Fit training, DME instructions, Aquatic Therapy, Dry Needling, Electrical stimulation, Spinal manipulation, Spinal mobilization, Cryotherapy, Moist heat, Compression bandaging, scar mobilization, Splintting, Taping, Traction, Ultrasound, Ionotophoresis 4mg /ml Dexamethasone, and Manual therapy  PLAN FOR NEXT SESSION: review goals and HEP; assess reaction to habituation exercises; re-test for BPPV if patient is having positional dizziness    9:14 AM, 10/20/21 Denzil Mceachron Small Cebastian Neis MPT [place]  physical therapy [place] [phone] Ph:[phone]

## 2021-10-20 ENCOUNTER — Ambulatory Visit (HOSPITAL_COMMUNITY): Payer: No Typology Code available for payment source | Attending: Neurology

## 2021-10-20 DIAGNOSIS — R42 Dizziness and giddiness: Secondary | ICD-10-CM

## 2021-10-20 DIAGNOSIS — H8110 Benign paroxysmal vertigo, unspecified ear: Secondary | ICD-10-CM | POA: Insufficient documentation

## 2021-10-20 DIAGNOSIS — Q8500 Neurofibromatosis, unspecified: Secondary | ICD-10-CM | POA: Insufficient documentation

## 2021-10-20 DIAGNOSIS — G43709 Chronic migraine without aura, not intractable, without status migrainosus: Secondary | ICD-10-CM | POA: Insufficient documentation

## 2021-10-28 ENCOUNTER — Other Ambulatory Visit (HOSPITAL_COMMUNITY): Payer: Self-pay

## 2021-10-28 MED ORDER — METOPROLOL SUCCINATE ER 25 MG PO TB24
25.0000 mg | ORAL_TABLET | Freq: Every day | ORAL | 3 refills | Status: DC
  Filled 2021-10-28 – 2022-01-22 (×3): qty 90, 90d supply, fill #0
  Filled 2022-05-03: qty 90, 90d supply, fill #1
  Filled 2022-07-29 – 2022-07-30 (×2): qty 90, 90d supply, fill #2

## 2021-11-06 ENCOUNTER — Other Ambulatory Visit (HOSPITAL_COMMUNITY): Payer: Self-pay

## 2021-11-10 ENCOUNTER — Encounter (HOSPITAL_COMMUNITY): Payer: No Typology Code available for payment source

## 2021-11-11 ENCOUNTER — Other Ambulatory Visit (HOSPITAL_COMMUNITY): Payer: Self-pay

## 2021-11-17 ENCOUNTER — Encounter (HOSPITAL_COMMUNITY): Payer: No Typology Code available for payment source

## 2021-11-23 ENCOUNTER — Other Ambulatory Visit (HOSPITAL_COMMUNITY): Payer: Self-pay

## 2021-12-28 ENCOUNTER — Other Ambulatory Visit (HOSPITAL_COMMUNITY): Payer: Self-pay

## 2022-01-22 ENCOUNTER — Other Ambulatory Visit (HOSPITAL_COMMUNITY): Payer: Self-pay

## 2022-01-30 DIAGNOSIS — H1031 Unspecified acute conjunctivitis, right eye: Secondary | ICD-10-CM | POA: Insufficient documentation

## 2022-02-03 NOTE — Progress Notes (Signed)
NEUROLOGY FOLLOW UP OFFICE NOTE  Anna Willis [id number]  Assessment/Plan:   Migraine without aura, without status migrainosus, not intractable Neurofibromatosis, type 1 - no objective abnormalities on exam to warrant imaging at this time.    Migraine prevention:  Continue Aimovig 70mg  every 28 days.  Advised to take Miralax with electrolyte water for those 2-3 days after injection.  If she still has hard stool and bleeding per rectum, will change to Emgality.  Since she has a lot of family stressors at this time, she wants to defer changing medication at this time.   Migraine rescue:  stop rizatriptan and restart sumatriptan 100mg  Limit use of pain relievers to no more than 2 days out of week to prevent risk of rebound or medication-overuse headache. Keep headache diary Recommend continue annual eye exam Follow up 6 months.       Subjective:  Anna Willis is a 41 year old right-handed female who follows up for Neurofibromatosis and migraines.   UPDATE: For dizziness, referred to vestibular rehab.  Anna Willis was negative.  Responded to physical therapy. Aimovig causes constipation.  She takes Colace daily.  For the first 2-3 days after an injection, it is worse.  She has hard stool that causes bright red blood per rectum.   Rizatriptan ineffective and causes her to urinate frequently.   Intensity:  severe Duration:  over an hour with rizatriptan.  Sumatriptan works better. Frequency:  2 to 3 in the last week.   Rescue protocol:  rizatriptan first line, Excedrin second line - migraines usually return afterwards. Current NSAIDS/analgesics:  Excedrin, Celebrex (for bodyaches) Current triptans:  rizatriptan 10mg  Current ergotamine:  none Current anti-emetic:  Zofran 4mg  Current muscle relaxants:  none Current Antihypertensive medications:  metoprolol Current Antidepressant medications:  Wellbutrin 300mg  Current Anticonvulsant medications:  none Current anti-CGRP:  Aimovig  70mg  every 28d (concern to increase dose due to constipation)   Current Vitamins/Herbal/Supplements:  none Current Antihistamines/Decongestants:  Benadryl Other therapy:  none Birth control:  none  Caffeine:  6 oz coffee daily.  Rarely tea. No more than 1 cola or Dr. 46 a day. Diet:  Hydrates.  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  She was diagnosed with Neurofibromatosis type 1 as a child.  Father has it as well.  She exhibits cafe au lait spots and subcutaneous neurofibromas.  Overall no symptoms.  Legs always feel fatigued with decreased tone but no significant weakness, pain, visual disturbance.  She does get annual eye exams.  She does report some low back pain, no radiation down the legs.     She has history of migraines since childhood.  They particularly became frequent around 2015.  In 2016, she was noted to have elevated prolactin levels.  MRI of brain and pituitary gland with and without contrast on 01/08/2015 was normal and revealed no pituitary lesion.  Migraine are severe right -sided pounding headache/retro-orbital.  No preceding aura.  Associated with nausea, photophobia, phonophobia, osmophobia.  No vomiting, visual disturbance, numbness or weakness.  Migraines usually last 3 hours off and on for about 3 days and occur 2-3 a month since being on Aimovig (used to be 12-15 days a month).  Triggers include heat, strong smells (candles, soaps, perfumes), emotional stress, loud noise  exposure.   Also has tension type bandlike.  They occur 2-3 times a month but will last most of the day.  Usually treats with Excedrin.   She has been experiencing positional dizziness since having adenovirus infection 6 weeks ago.  She had associated conjunctivitis.  She was  quite dizzy at that time.  It is a sensation of room or self movement.  She has not fallen at work but is afraid that she might.  They last 10 seconds.  Triggered by changing positions such as bending over or looking up to reach for anything.  She also notes that Excedrin triggers the dizziness.  She does have a shooting pain up the right side of her jaw. Her jaw does typically "pop".  And in her ear  Saw dentist who told her that she "bruised her tooth.      Past NSAIDS/analgesics:  ibuprofen, Tylenol Past abortive triptans:  sumatriptan tab Past abortive ergotamine:  none Past muscle relaxants:  Flexeril, Zanflex Past anti-emetic:  promethazine Past antihypertensive medications:  none Past antidepressant medications:  venlafaxine (side effects), sertraline Past anticonvulsant medications:  none Past anti-CGRP:  none Other past therapies:  none    Family history:  Father (NF 1), mother (epilepsy)  PAST MEDICAL HISTORY: Past Medical History:  Diagnosis Date   Acid reflux    Anxiety    Complication of anesthesia    muscle weakness after surgery      Migraine    Neurofibromatosis ([place])    Scoliosis    Seasonal allergies    Tachycardia     MEDICATIONS: Current Outpatient Medications on File Prior to Visit  Medication Sig Dispense Refill   buPROPion (WELLBUTRIN XL) 300 MG 24 hr tablet Take 1 tablet (300 mg total) by mouth daily. 90 tablet 3   cetirizine (ZYRTEC ALLERGY) 10 MG tablet Take 1 tablet (10 mg total) by mouth daily. 30 tablet 0   Erenumab-aooe (AIMOVIG) 70 MG/ML SOAJ Inject 70 mg into the skin every 28 (twenty-eight) days. 3 mL 1   esomeprazole (NEXIUM) 20 MG capsule Take 20 mg by mouth daily before breakfast.      metoprolol succinate (TOPROL-XL) 25 MG 24 hr tablet TAKE 1 TABLET BY MOUTH ONCE DAILY. 90 tablet 0   metoprolol succinate (TOPROL-XL) 25 MG 24 hr tablet Take 1 tablet (25 mg total) by mouth daily. 90 tablet 3   ondansetron (ZOFRAN) 4 MG tablet Take 1 tablet  (4 mg total) by mouth every 6 (six) hours. 40 tablet 9   rizatriptan (MAXALT-MLT) 10 MG disintegrating tablet Take 1 tablet (10 mg total) by mouth as needed for migraine (May repeat after 2 hours.  Maximum 2 tablets in 24 hours.). May repeat in 2 hours if needed 9 tablet 5   No current facility-administered medications on file prior to visit.    ALLERGIES: Allergies  Allergen Reactions   Amoxicillin Nausea And Vomiting    Projectile vomiting   Rocephin [Ceftriaxone] Nausea And Vomiting   Succinylcholine Other (See Comments)    Muscle weakness/muscle pain.   Erythromycin Rash    FAMILY HISTORY: Family History  Problem Relation Age of Onset   Migraines Mother    Epilepsy Mother    Endometriosis Mother        had hyst at age 86   Neurofibromatosis Father    Breast cancer Paternal Aunt    Breast cancer Paternal Aunt    Breast cancer Paternal Aunt    Dementia Maternal Grandmother    Kidney  disease Maternal Grandmother    Other Maternal Grandmother        shingles   Heart attack Maternal Grandfather       Objective:  Blood pressure 117/75, pulse 94, height 5\' 1"  (1.549 m), weight 153 lb (69.4 kg), last menstrual period 09/27/2019, SpO2 99 %. General: No acute distress.  Patient appears well-groomed.   Head:  Normocephalic/atraumatic Eyes:  Fundi examined but not visualized Neck: supple, no paraspinal tenderness, full range of motion Heart:  Regular rate and rhythm Lungs:  Clear to auscultation bilaterally Back: No paraspinal tenderness Neurological Exam: alert and oriented to person, place, and time.  Speech fluent and not dysarthric, language intact.  CN II-XII intact. Bulk and tone normal, muscle strength 5/5 throughout.  Sensation to light touch intact.  Deep tendon reflexes 2+ throughout, toes downgoing.  Finger to nose testing intact.  Gait normal, Romberg negative.   09/29/2019, DO

## 2022-02-08 ENCOUNTER — Ambulatory Visit (INDEPENDENT_AMBULATORY_CARE_PROVIDER_SITE_OTHER): Payer: No Typology Code available for payment source | Admitting: Neurology

## 2022-02-08 ENCOUNTER — Encounter: Payer: Self-pay | Admitting: Neurology

## 2022-02-08 ENCOUNTER — Other Ambulatory Visit (HOSPITAL_COMMUNITY): Payer: Self-pay

## 2022-02-08 VITALS — BP 117/75 | HR 94 | Ht 61.0 in | Wt 153.0 lb

## 2022-02-08 DIAGNOSIS — G43009 Migraine without aura, not intractable, without status migrainosus: Secondary | ICD-10-CM | POA: Diagnosis not present

## 2022-02-08 DIAGNOSIS — Q8501 Neurofibromatosis, type 1: Secondary | ICD-10-CM | POA: Diagnosis not present

## 2022-02-08 MED ORDER — AIMOVIG 70 MG/ML ~~LOC~~ SOAJ
70.0000 mg | SUBCUTANEOUS | 11 refills | Status: DC
  Filled 2022-02-08: qty 3, 84d supply, fill #0
  Filled 2022-02-16: qty 1, 30d supply, fill #0
  Filled 2022-03-26: qty 1, 30d supply, fill #1
  Filled 2022-05-03: qty 1, 30d supply, fill #2
  Filled 2022-05-28: qty 1, 30d supply, fill #3
  Filled 2022-06-22: qty 1, 30d supply, fill #4
  Filled 2022-07-29: qty 1, 30d supply, fill #5
  Filled 2022-09-10: qty 1, 30d supply, fill #6
  Filled 2022-10-13: qty 1, 30d supply, fill #7
  Filled 2022-11-11: qty 1, 30d supply, fill #8
  Filled 2022-12-16: qty 1, 30d supply, fill #9
  Filled 2023-01-11: qty 1, 30d supply, fill #10
  Filled 2023-02-08: qty 1, 30d supply, fill #11

## 2022-02-08 MED ORDER — SUMATRIPTAN SUCCINATE 100 MG PO TABS
100.0000 mg | ORAL_TABLET | ORAL | 5 refills | Status: DC | PRN
  Filled 2022-02-08: qty 9, 30d supply, fill #0
  Filled 2022-05-19: qty 9, 30d supply, fill #1
  Filled 2022-07-29: qty 9, 30d supply, fill #2
  Filled 2022-12-16: qty 9, 30d supply, fill #3

## 2022-02-16 ENCOUNTER — Other Ambulatory Visit (HOSPITAL_COMMUNITY): Payer: Self-pay

## 2022-02-16 MED ORDER — BUPROPION HCL ER (XL) 300 MG PO TB24
300.0000 mg | ORAL_TABLET | Freq: Every day | ORAL | 0 refills | Status: DC
  Filled 2022-02-16: qty 90, 90d supply, fill #0

## 2022-03-26 ENCOUNTER — Other Ambulatory Visit (HOSPITAL_COMMUNITY): Payer: Self-pay

## 2022-04-01 ENCOUNTER — Encounter (HOSPITAL_COMMUNITY): Payer: Self-pay

## 2022-04-01 NOTE — Therapy (Signed)
PHYSICAL THERAPY DISCHARGE SUMMARY  Visits from Start of Care: 1  Current functional level related to goals / functional outcomes: na   Remaining deficits: na   Education / Equipment: na   Patient agrees to discharge. Patient goals were not met. Patient is being discharged due to not returning since the last visit.  

## 2022-05-03 ENCOUNTER — Other Ambulatory Visit (HOSPITAL_COMMUNITY): Payer: Self-pay

## 2022-05-19 ENCOUNTER — Other Ambulatory Visit (HOSPITAL_COMMUNITY): Payer: Self-pay

## 2022-05-19 MED ORDER — BUPROPION HCL ER (XL) 300 MG PO TB24
300.0000 mg | ORAL_TABLET | Freq: Every day | ORAL | 0 refills | Status: DC
  Filled 2022-05-19: qty 90, 90d supply, fill #0

## 2022-05-20 ENCOUNTER — Other Ambulatory Visit (HOSPITAL_COMMUNITY): Payer: Self-pay

## 2022-05-20 MED ORDER — BUPROPION HCL ER (XL) 300 MG PO TB24
300.0000 mg | ORAL_TABLET | Freq: Every day | ORAL | 3 refills | Status: DC
  Filled 2022-05-20: qty 90, 90d supply, fill #0

## 2022-06-18 ENCOUNTER — Other Ambulatory Visit (HOSPITAL_COMMUNITY): Payer: Self-pay

## 2022-06-18 MED ORDER — BUPROPION HCL 100 MG PO TABS
100.0000 mg | ORAL_TABLET | Freq: Two times a day (BID) | ORAL | 0 refills | Status: DC
  Filled 2022-06-18: qty 42, 21d supply, fill #0

## 2022-06-18 MED ORDER — CONTRAVE 8-90 MG PO TB12
ORAL_TABLET | ORAL | 0 refills | Status: DC
  Filled 2022-06-18 – 2022-07-01 (×2): qty 120, 30d supply, fill #0
  Filled 2022-07-02: qty 78, 30d supply, fill #0
  Filled 2022-07-29: qty 78, fill #1

## 2022-06-22 ENCOUNTER — Other Ambulatory Visit (HOSPITAL_COMMUNITY): Payer: Self-pay

## 2022-06-25 ENCOUNTER — Other Ambulatory Visit (HOSPITAL_COMMUNITY): Payer: Self-pay

## 2022-06-28 ENCOUNTER — Other Ambulatory Visit (HOSPITAL_COMMUNITY): Payer: Self-pay

## 2022-06-28 ENCOUNTER — Other Ambulatory Visit: Payer: Self-pay

## 2022-07-01 ENCOUNTER — Other Ambulatory Visit: Payer: Self-pay

## 2022-07-02 ENCOUNTER — Other Ambulatory Visit (HOSPITAL_COMMUNITY): Payer: Self-pay

## 2022-07-05 ENCOUNTER — Other Ambulatory Visit: Payer: Self-pay

## 2022-07-05 ENCOUNTER — Other Ambulatory Visit (HOSPITAL_COMMUNITY): Payer: Self-pay

## 2022-07-08 ENCOUNTER — Other Ambulatory Visit (HOSPITAL_COMMUNITY): Payer: Self-pay

## 2022-07-16 DIAGNOSIS — H5213 Myopia, bilateral: Secondary | ICD-10-CM | POA: Diagnosis not present

## 2022-07-29 ENCOUNTER — Other Ambulatory Visit (HOSPITAL_COMMUNITY): Payer: Self-pay

## 2022-07-29 ENCOUNTER — Other Ambulatory Visit: Payer: Self-pay

## 2022-07-29 MED ORDER — NALTREXONE-BUPROPION HCL ER 8-90 MG PO TB12
ORAL_TABLET | ORAL | 2 refills | Status: DC
  Filled 2022-07-29: qty 120, 30d supply, fill #0
  Filled 2022-09-10: qty 120, 30d supply, fill #1
  Filled 2022-10-13: qty 120, 30d supply, fill #2

## 2022-07-30 ENCOUNTER — Other Ambulatory Visit (HOSPITAL_COMMUNITY): Payer: Self-pay

## 2022-08-09 ENCOUNTER — Other Ambulatory Visit (HOSPITAL_COMMUNITY): Payer: Self-pay

## 2022-08-09 NOTE — Progress Notes (Unsigned)
NEUROLOGY FOLLOW UP OFFICE NOTE  Anna Willis [id number]  Assessment/Plan:   Migraine without aura, without status migrainosus, not intractable Vertigo Neurofibromatosis, type 1    Migraine prevention:  Aimovig 70mg  every 28 days.  If constipation does not improve, she will contact me and we can change to another CGRP inhibitor. Migraine rescue: sumatriptan 100mg  Refer to physical therapy for vestibular rehab Limit use of pain relievers to no more than 2 days out of week to prevent risk of rebound or medication-overuse headache. Keep headache diary Recommend continue annual eye exam Follow up 9 months.       Subjective:  Anna Willis is a 42 year old right-handed female who follows up for Neurofibromatosis and migraines.   UPDATE: Aimovig effective but constipation is still an issue.  It did improve but has gotten worse since starting Contrave.  She does not want to change Aimovig, however, and thinks she can manage the constipation.  Intensity:  severe Duration:  within 1 hour with sumatriptan, however if triggered by environmental factor (perfume, heat), needs to repeat dose.   Frequency:  2-3 in last 30 days  She reports recurrence of vertigo.  She would like another referral to PT as it was previously effective.    Rescue protocol:  rizatriptan first line, Excedrin second line - migraines usually return afterwards. Current NSAIDS/analgesics:  Excedrin, Celebrex (for bodyaches) Current triptans:  sumatriptan 100mg  Current ergotamine:  none Current anti-emetic:  Zofran 4mg  Current muscle relaxants:  none Current Antihypertensive medications:  metoprolol Current Antidepressant medications:  Wellbutrin 300mg  Current Anticonvulsant medications:  none Current anti-CGRP:  Aimovig 70mg  Q4wks  Current Vitamins/Herbal/Supplements:  none Current Antihistamines/Decongestants:  Benadryl Other therapy:  none Birth control:  none  Caffeine:  6 oz coffee daily.  Rarely tea.  No more than 1 cola or Dr. Reino Kent a day. Diet:  Hydrates.  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  She was diagnosed with Neurofibromatosis type 1 as a child.  Father has it as well.  She exhibits cafe au lait spots and subcutaneous neurofibromas.  Overall no symptoms.  Legs always feel fatigued with decreased tone but no significant weakness, pain, visual disturbance.  She does get annual eye exams.  She does report some low back pain, no radiation down the legs.     She has history of migraines since childhood.  They particularly became frequent around 2015.  In 2016, she was noted to have elevated prolactin levels.  MRI of brain and pituitary gland with and without contrast on 01/08/2015 was normal and revealed no pituitary lesion.  Migraine are severe right -sided pounding headache/retro-orbital.  No preceding aura.  Associated with nausea, photophobia, phonophobia, osmophobia.  No vomiting, visual disturbance, numbness or weakness.  Migraines usually last 3 hours off and on for about 3 days and occur 2-3 a month since being on Aimovig (used to be 12-15 days a month).  Triggers include heat, strong smells (candles, soaps, perfumes), emotional stress, loud noise exposure.   Also has tension type bandlike.  They occur 2-3 times a month but will last most of the day.  Usually treats with Excedrin.   She has been experiencing positional dizziness since having adenovirus infection 6 weeks ago.  She had associated conjunctivitis.  She  was quite dizzy at that time.  It is a sensation of room or self movement.  She has not fallen at work but is afraid that she might.  They last 10 seconds.  Triggered by changing positions such as bending over or looking up to reach for anything.  She  also notes that Excedrin triggers the dizziness.  She does have a shooting pain up the right side of her jaw. Her jaw does typically "pop".  And in her ear  Saw dentist who told her that she "bruised her tooth.  She was referred to vestibular rehab.  Gilberto Better was negative.  Responded to physical therapy.      Past NSAIDS/analgesics:  ibuprofen, Tylenol Past abortive triptans:   rizatriptan (ineffective and caused frequent urinary urgency) Past abortive ergotamine:  none Past muscle relaxants:  Flexeril, Zanflex Past anti-emetic:  promethazine Past antihypertensive medications:  none Past antidepressant medications:  venlafaxine (side effects), sertraline Past anticonvulsant medications:  none Past anti-CGRP:  none Other past therapies:  none    Family history:  Father (NF 1), mother (epilepsy)  PAST MEDICAL HISTORY: Past Medical History:  Diagnosis Date   Acid reflux    Anxiety    Complication of anesthesia    muscle weakness after surgery      Migraine    Neurofibromatosis ([facility])    Scoliosis    Seasonal allergies    Tachycardia     MEDICATIONS: Current Outpatient Medications on File Prior to Visit  Medication Sig Dispense Refill   cetirizine (ZYRTEC ALLERGY) 10 MG tablet Take 1 tablet (10 mg total) by mouth daily. 30 tablet 0   Erenumab-aooe (AIMOVIG) 70 MG/ML SOAJ Inject 70 mg into the skin every 28 (twenty-eight) days. 3 mL 11   esomeprazole (NEXIUM) 20 MG capsule Take 20 mg by mouth daily before breakfast.      metoprolol succinate (TOPROL-XL) 25 MG 24 hr tablet Take 1 tablet (25 mg total) by mouth daily. 90 tablet 3   Naltrexone-buPROPion HCl ER (CONTRAVE) 8-90 MG TB12 Take 1 tablet every morning for 1week, then 1 tablet twice a day x1week, then 2 tablets every morning and 1 tablet at bedtime x 1week, then 2 tablets twice a day. 120 tablet 2   ondansetron (ZOFRAN) 4 MG tablet Take 1 tablet (4 mg total) by mouth every 6 (six) hours. 40 tablet 9   SUMAtriptan (IMITREX)  100 MG tablet Take 1 tablet (100 mg total) by mouth as needed for migraine. May repeat in 2 hours if headache persists or recurs.  Maximum 2 tablets in 24 hours. 10 tablet 5   No current facility-administered medications on file prior to visit.     ALLERGIES: Allergies  Allergen Reactions   Amoxicillin Nausea And Vomiting    Projectile vomiting   Rocephin [Ceftriaxone] Nausea And Vomiting   Succinylcholine Other (See Comments)    Muscle weakness/muscle pain.   Erythromycin Rash    FAMILY HISTORY: Family History  Problem Relation Age of Onset   Migraines Mother    Epilepsy Mother    Endometriosis Mother        had hyst at age 56   Neurofibromatosis Father    Breast cancer Paternal Aunt    Breast cancer Paternal Aunt    Breast cancer Paternal Aunt    Dementia Maternal Grandmother    Kidney disease Maternal Grandmother    Other Maternal Grandmother        shingles   Heart attack Maternal Grandfather  Objective:  Blood pressure 107/71, pulse 76, height 5' (1.524 m), weight 152 lb (68.9 kg), last menstrual period 09/27/2019, SpO2 97 %.. General: No acute distress.  Patient appears well-groomed.     Shon Millet, DO

## 2022-08-10 ENCOUNTER — Other Ambulatory Visit (HOSPITAL_COMMUNITY): Payer: Self-pay

## 2022-08-10 ENCOUNTER — Ambulatory Visit: Payer: 59 | Admitting: Neurology

## 2022-08-10 ENCOUNTER — Encounter: Payer: Self-pay | Admitting: Neurology

## 2022-08-10 VITALS — BP 107/71 | HR 76 | Ht 60.0 in | Wt 152.0 lb

## 2022-08-10 DIAGNOSIS — Q8501 Neurofibromatosis, type 1: Secondary | ICD-10-CM | POA: Diagnosis not present

## 2022-08-10 DIAGNOSIS — G43009 Migraine without aura, not intractable, without status migrainosus: Secondary | ICD-10-CM | POA: Diagnosis not present

## 2022-08-10 DIAGNOSIS — R42 Dizziness and giddiness: Secondary | ICD-10-CM | POA: Diagnosis not present

## 2022-08-10 NOTE — Patient Instructions (Signed)
Continue Aimovig and sumatriptan Refer to physical therapy for vertigo Limit use of pain relievers to no more than 2 days out of week to prevent risk of rebound or medication-overuse headache. Follow up 9 months.

## 2022-09-10 ENCOUNTER — Other Ambulatory Visit (HOSPITAL_COMMUNITY): Payer: Self-pay

## 2022-09-15 ENCOUNTER — Other Ambulatory Visit (HOSPITAL_COMMUNITY): Payer: Self-pay

## 2022-10-13 ENCOUNTER — Other Ambulatory Visit (HOSPITAL_COMMUNITY): Payer: Self-pay

## 2022-11-11 ENCOUNTER — Other Ambulatory Visit: Payer: Self-pay

## 2022-11-11 ENCOUNTER — Other Ambulatory Visit (HOSPITAL_COMMUNITY): Payer: Self-pay

## 2022-11-11 MED ORDER — METOPROLOL SUCCINATE ER 25 MG PO TB24
25.0000 mg | ORAL_TABLET | Freq: Every day | ORAL | 3 refills | Status: DC
  Filled 2022-11-11: qty 90, 90d supply, fill #0
  Filled 2022-12-16 – 2023-02-08 (×2): qty 90, 90d supply, fill #1
  Filled 2023-05-05: qty 90, 90d supply, fill #2
  Filled 2023-08-15 (×2): qty 90, 90d supply, fill #3

## 2022-11-11 MED ORDER — ONDANSETRON HCL 4 MG PO TABS
4.0000 mg | ORAL_TABLET | Freq: Four times a day (QID) | ORAL | 9 refills | Status: AC
  Filled 2022-11-11: qty 40, 10d supply, fill #0
  Filled 2022-12-16: qty 40, 10d supply, fill #1

## 2022-11-11 MED ORDER — NALTREXONE-BUPROPION HCL ER 8-90 MG PO TB12
ORAL_TABLET | ORAL | 0 refills | Status: AC
  Filled 2022-11-11: qty 120, 30d supply, fill #0
  Filled 2022-12-16: qty 120, 28d supply, fill #0
  Filled 2023-03-25: qty 120, 30d supply, fill #0
  Filled 2023-05-25: qty 63, 28d supply, fill #0

## 2022-11-12 ENCOUNTER — Other Ambulatory Visit (HOSPITAL_COMMUNITY): Payer: Self-pay

## 2022-11-12 MED ORDER — ONDANSETRON HCL 4 MG PO TABS
4.0000 mg | ORAL_TABLET | Freq: Four times a day (QID) | ORAL | 9 refills | Status: AC
  Filled 2022-11-12 – 2022-12-16 (×2): qty 40, 10d supply, fill #0

## 2022-11-15 ENCOUNTER — Other Ambulatory Visit (HOSPITAL_COMMUNITY): Payer: Self-pay

## 2022-11-16 ENCOUNTER — Other Ambulatory Visit (HOSPITAL_COMMUNITY): Payer: Self-pay

## 2022-11-16 MED ORDER — CONTRAVE 8-90 MG PO TB12
2.0000 | ORAL_TABLET | Freq: Two times a day (BID) | ORAL | 11 refills | Status: DC
  Filled 2022-11-16: qty 120, 30d supply, fill #0
  Filled 2022-12-16: qty 120, 30d supply, fill #1
  Filled 2023-01-20: qty 120, 30d supply, fill #2
  Filled 2023-02-23: qty 120, 30d supply, fill #3
  Filled 2023-03-25: qty 120, 30d supply, fill #4
  Filled 2023-04-25 (×2): qty 120, 30d supply, fill #5
  Filled 2023-06-07: qty 120, 30d supply, fill #6
  Filled 2023-07-14: qty 120, 30d supply, fill #7
  Filled 2023-08-15: qty 120, 30d supply, fill #8
  Filled 2023-09-15: qty 120, 30d supply, fill #9
  Filled 2023-10-17: qty 120, 30d supply, fill #10

## 2022-11-18 ENCOUNTER — Other Ambulatory Visit (HOSPITAL_COMMUNITY): Payer: Self-pay

## 2022-11-19 ENCOUNTER — Other Ambulatory Visit (HOSPITAL_COMMUNITY): Payer: Self-pay

## 2022-12-16 ENCOUNTER — Other Ambulatory Visit (HOSPITAL_COMMUNITY): Payer: Self-pay

## 2023-01-11 ENCOUNTER — Other Ambulatory Visit (HOSPITAL_COMMUNITY): Payer: Self-pay

## 2023-01-20 ENCOUNTER — Other Ambulatory Visit (HOSPITAL_COMMUNITY): Payer: Self-pay

## 2023-02-08 ENCOUNTER — Telehealth: Payer: Self-pay | Admitting: Neurology

## 2023-02-08 ENCOUNTER — Other Ambulatory Visit (HOSPITAL_COMMUNITY): Payer: Self-pay

## 2023-02-08 ENCOUNTER — Other Ambulatory Visit: Payer: Self-pay

## 2023-02-08 NOTE — Telephone Encounter (Signed)
Per rep at pharmacy refill ready for pick up.

## 2023-02-08 NOTE — Telephone Encounter (Signed)
Pt needs a refill sent to the [facility] out patient Pharmacy for the [facility]

## 2023-02-14 DIAGNOSIS — J22 Unspecified acute lower respiratory infection: Secondary | ICD-10-CM | POA: Insufficient documentation

## 2023-02-23 ENCOUNTER — Other Ambulatory Visit: Payer: Self-pay | Admitting: Neurology

## 2023-02-23 ENCOUNTER — Other Ambulatory Visit (HOSPITAL_COMMUNITY): Payer: Self-pay

## 2023-02-24 ENCOUNTER — Other Ambulatory Visit (HOSPITAL_COMMUNITY): Payer: Self-pay

## 2023-02-24 MED ORDER — AIMOVIG 70 MG/ML ~~LOC~~ SOAJ
70.0000 mg | SUBCUTANEOUS | 3 refills | Status: DC
  Filled 2023-02-24 – 2023-03-04 (×2): qty 3, 90d supply, fill #0
  Filled 2023-05-10 – 2023-06-24 (×7): qty 3, 90d supply, fill #1
  Filled 2023-09-21 – 2023-10-17 (×2): qty 3, 90d supply, fill #2
  Filled 2024-01-25 – 2024-01-31 (×2): qty 3, 90d supply, fill #3

## 2023-03-04 ENCOUNTER — Other Ambulatory Visit (HOSPITAL_COMMUNITY): Payer: Self-pay

## 2023-03-07 ENCOUNTER — Other Ambulatory Visit (HOSPITAL_COMMUNITY): Payer: Self-pay

## 2023-03-25 ENCOUNTER — Other Ambulatory Visit: Payer: Self-pay | Admitting: Neurology

## 2023-03-25 ENCOUNTER — Other Ambulatory Visit (HOSPITAL_COMMUNITY): Payer: Self-pay

## 2023-03-28 ENCOUNTER — Other Ambulatory Visit (HOSPITAL_COMMUNITY): Payer: Self-pay

## 2023-03-28 MED ORDER — SUMATRIPTAN SUCCINATE 100 MG PO TABS
100.0000 mg | ORAL_TABLET | ORAL | 0 refills | Status: DC | PRN
  Filled 2023-03-28: qty 9, 30d supply, fill #0

## 2023-04-01 ENCOUNTER — Other Ambulatory Visit (HOSPITAL_COMMUNITY): Payer: Self-pay

## 2023-04-15 ENCOUNTER — Other Ambulatory Visit (HOSPITAL_COMMUNITY): Payer: Self-pay

## 2023-04-25 ENCOUNTER — Other Ambulatory Visit (HOSPITAL_COMMUNITY): Payer: Self-pay

## 2023-04-25 ENCOUNTER — Other Ambulatory Visit: Payer: Self-pay

## 2023-05-05 ENCOUNTER — Other Ambulatory Visit (HOSPITAL_COMMUNITY): Payer: Self-pay

## 2023-05-10 ENCOUNTER — Other Ambulatory Visit (HOSPITAL_COMMUNITY): Payer: Self-pay

## 2023-05-11 NOTE — Progress Notes (Deleted)
 NEUROLOGY FOLLOW UP OFFICE NOTE  Anna Willis [id number]  Assessment/Plan:   Migraine without aura, without status migrainosus, not intractable Vertigo Neurofibromatosis, type 1    Migraine prevention:  Aimovig 70mg  every 28 days *** Migraine rescue: sumatriptan 100mg  *** Limit use of pain relievers to no more than 2 days out of week to prevent risk of rebound or medication-overuse headache. Keep headache diary Recommend continue annual eye exam Follow up ***       Subjective:  Anna Willis is a 43 year old right-handed female who follows up for Neurofibromatosis and migraines.   UPDATE: Constipation ***  Intensity:  severe Duration:  within 1 hour with sumatriptan, however if triggered by environmental factor (perfume, heat), needs to repeat dose.   Frequency:  2-3 in last 30 days  Last visit, she reported recurrence of vertigo.  She was referred to PT for vestibular rehab ***  Rescue protocol:  rizatriptan first line, Excedrin second line - migraines usually return afterwards. Current NSAIDS/analgesics:  Excedrin, Celebrex (for bodyaches) Current triptans:  sumatriptan 100mg  Current ergotamine:  none Current anti-emetic:  Zofran 4mg  Current muscle relaxants:  none Current Antihypertensive medications:  metoprolol Current Antidepressant medications:  Wellbutrin 300mg  Current Anticonvulsant medications:  none Current anti-CGRP:  Aimovig 70mg  Q4wks  Current Vitamins/Herbal/Supplements:  none Current Antihistamines/Decongestants:  Benadryl Other therapy:  none Birth control:  none  Caffeine:  6 oz coffee daily.  Rarely tea. No more than 1 cola or Dr. Reino Kent a day. Diet:  Hydrates.  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  She was  diagnosed with Neurofibromatosis type 1 as a child.  Father has it as well.  She exhibits cafe au lait spots and subcutaneous neurofibromas.  Overall no symptoms.  Legs always feel fatigued with decreased tone but no significant weakness, pain, visual disturbance.  She does get annual eye exams.  She does report some low back pain, no radiation down the legs.     She has history of migraines since childhood.  They particularly became frequent around 2015.  In 2016, she was noted to have elevated prolactin levels.  MRI of brain and pituitary gland with and without contrast on 01/08/2015 was normal and revealed no pituitary lesion.  Migraine are severe right -sided pounding headache/retro-orbital.  No preceding aura.  Associated with nausea, photophobia, phonophobia, osmophobia.  No vomiting, visual disturbance, numbness or weakness.  Migraines usually last 3 hours off and on for about 3 days and occur 2-3 a month since being on Aimovig (used to be 12-15 days a month).  Triggers include heat, strong smells (candles, soaps, perfumes), emotional stress, loud noise exposure.   Also has tension type bandlike.  They occur 2-3 times a month but will last most of the day.  Usually treats with Excedrin.   She has been experiencing positional dizziness since having adenovirus infection 6 weeks ago.  She had associated conjunctivitis.  She was quite dizzy at that time.  It is a sensation of room or self movement.  She has not fallen at work but is afraid that she might.  They last 10 seconds.  Triggered by changing positions such as bending over or looking up to reach for anything.  She also notes that Excedrin triggers the dizziness.  She does have a shooting pain up the right side of her jaw. Her jaw does typically "pop".  And in her ear  Saw dentist who told her that she "bruised her tooth.  She was referred to vestibular rehab.  Gilberto Better was negative.  Responded to physical therapy.      Past NSAIDS/analgesics:   ibuprofen, Tylenol Past abortive triptans:   rizatriptan (ineffective and caused frequent urinary urgency) Past abortive ergotamine:  none Past muscle relaxants:  Flexeril, Zanflex Past anti-emetic:  promethazine Past antihypertensive medications:  none Past antidepressant medications:  venlafaxine (side effects), sertraline Past anticonvulsant medications:  none Past anti-CGRP:  none Other past therapies:  none    Family history:  Father (NF 1), mother (epilepsy)  PAST MEDICAL HISTORY: Past Medical History:  Diagnosis Date   Acid reflux    Anxiety    Complication of anesthesia    muscle weakness after surgery      Migraine    Neurofibromatosis ([facility])    Scoliosis    Seasonal allergies    Tachycardia     MEDICATIONS: Current Outpatient Medications on File Prior to Visit  Medication Sig Dispense Refill   cetirizine (ZYRTEC ALLERGY) 10 MG tablet Take 1 tablet (10 mg total) by mouth daily. 30 tablet 0   Erenumab-aooe (AIMOVIG) 70 MG/ML SOAJ Inject 70 mg into the skin every 28 (twenty-eight) days. 3 mL 3   esomeprazole (NEXIUM) 20 MG capsule Take 20 mg by mouth daily before breakfast.      metoprolol succinate (TOPROL-XL) 25 MG 24 hr tablet Take 1 tablet (25 mg total) by mouth daily. 90 tablet 3   Naltrexone-buPROPion HCl ER (CONTRAVE) 8-90 MG TB12 Take 2 tablets by mouth 2 (two) times daily. 120 tablet 11   ondansetron (ZOFRAN) 4 MG tablet Take 1 tablet (4 mg total) by mouth every 6 (six) hours. 40 tablet 9   ondansetron (ZOFRAN) 4 MG tablet Take 1 tablet (4 mg total) by mouth every 6 (six) hours. 40 tablet 9   SUMAtriptan (IMITREX) 100 MG tablet Take 1 tablet (100 mg total) by mouth as needed for migraine. May repeat in 2 hours if headache persists or recurs.  Maximum 2 tablets in 24 hours. 10 tablet 0   No current facility-administered medications on file prior to visit.     ALLERGIES: Allergies  Allergen Reactions   Amoxicillin Nausea And Vomiting    Projectile  vomiting   Rocephin [Ceftriaxone] Nausea And Vomiting   Succinylcholine Other (See Comments)    Muscle weakness/muscle pain.   Erythromycin Rash    FAMILY HISTORY: Family History  Problem Relation Age of Onset   Migraines Mother    Epilepsy Mother    Endometriosis Mother        had hyst at age 24   Neurofibromatosis Father    Breast cancer Paternal Aunt    Breast cancer Paternal Aunt    Breast cancer Paternal Aunt    Dementia Maternal Grandmother    Kidney disease Maternal Grandmother    Other Maternal Grandmother        shingles   Heart attack Maternal Grandfather       Objective:  *** General: No acute distress.  Patient appears well-groomed.   Head:  Normocephalic/atraumatic Neck:  Supple.  No paraspinal tenderness.  Full range of motion. Heart:  Regular rate and rhythm. Neuro:  Alert and oriented.  Speech fluent and not dysarthric.  Language intact.  CN II-XII intact.  Bulk and tone normal.  Muscle strength 5/5 throughout.  Deep tendon reflexes 2+ throughout.  Gait normal.  Romberg negative.    Shon Millet, DO

## 2023-05-12 ENCOUNTER — Ambulatory Visit: Payer: Commercial Managed Care - PPO | Admitting: Neurology

## 2023-05-22 ENCOUNTER — Emergency Department (HOSPITAL_COMMUNITY): Payer: Commercial Managed Care - PPO

## 2023-05-22 ENCOUNTER — Other Ambulatory Visit: Payer: Self-pay

## 2023-05-22 ENCOUNTER — Encounter (HOSPITAL_COMMUNITY): Payer: Self-pay

## 2023-05-22 ENCOUNTER — Emergency Department (HOSPITAL_COMMUNITY)
Admission: EM | Admit: 2023-05-22 | Discharge: 2023-05-22 | Disposition: A | Discharge status: Home / Self Care | Payer: Commercial Managed Care - PPO | Attending: Emergency Medicine | Admitting: Emergency Medicine

## 2023-05-22 DIAGNOSIS — Y9372 Activity, wrestling: Secondary | ICD-10-CM | POA: Diagnosis not present

## 2023-05-22 DIAGNOSIS — S62654A Nondisplaced fracture of medial phalanx of right ring finger, initial encounter for closed fracture: Secondary | ICD-10-CM | POA: Diagnosis not present

## 2023-05-22 DIAGNOSIS — S62659A Nondisplaced fracture of medial phalanx of unspecified finger, initial encounter for closed fracture: Secondary | ICD-10-CM | POA: Insufficient documentation

## 2023-05-22 DIAGNOSIS — X58XXXA Exposure to other specified factors, initial encounter: Secondary | ICD-10-CM | POA: Insufficient documentation

## 2023-05-22 DIAGNOSIS — M79644 Pain in right finger(s): Secondary | ICD-10-CM | POA: Diagnosis present

## 2023-05-22 MED ORDER — IBUPROFEN 800 MG PO TABS
800.0000 mg | ORAL_TABLET | Freq: Once | ORAL | Status: AC
  Administered 2023-05-22: 800 mg via ORAL
  Filled 2023-05-22: qty 1

## 2023-05-22 MED ORDER — IBUPROFEN 600 MG PO TABS: 600.0000 mg | ORAL_TABLET | Freq: Three times a day (TID) | ORAL | 0 refills | Status: AC

## 2023-05-22 NOTE — ED Provider Notes (Signed)
 [place] EMERGENCY DEPARTMENT AT [facility] Provider Note   CSN: [id number] Arrival date & time: 05/22/23  9041     History  Chief Complaint  Patient presents with   Finger Injury    Anna Willis is a 43 y.o. female presenting for evaluation of right ring finger pain, swelling and bruising after injury sustained when she was play wrestling with her 1 year old child yesterday.  She is unsure of exactly how the injury occurred, whether it was a hyperextension or twisting injury, states she had a little bit of discomfort at the time, but became worse including swelling bruising and pain as the night progressed.  She has taken ibuprofen  which helped her sleep last night.  She presents with a aluminum finger splint.  Denies any other injuries, reports pain radiates through her dorsal hand with finger flexion.  The history is provided by the patient.       Home Medications Prior to Admission medications   Medication Sig Start Date End Date Taking? Authorizing Provider  ibuprofen  (ADVIL ) 600 MG tablet Take 1 tablet (600 mg total) by mouth 3 (three) times daily. 05/22/23  Yes Wilson Dusenbery, PA-C  cetirizine  (ZYRTEC  ALLERGY) 10 MG tablet Take 1 tablet (10 mg total) by mouth daily. 02/02/21   Christopher Savannah, PA-C  Erenumab -aooe (AIMOVIG ) 70 MG/ML SOAJ Inject 70 mg into the skin every 28 (twenty-eight) days. 02/24/23   Skeet Juliene SAUNDERS, DO  esomeprazole (NEXIUM) 20 MG capsule Take 20 mg by mouth daily before breakfast.     [provider]  metoprolol  succinate (TOPROL -XL) 25 MG 24 hr tablet Take 1 tablet (25 mg total) by mouth daily. 11/11/22     Naltrexone -buPROPion  HCl ER (CONTRAVE ) 8-90 MG TB12 Take 2 tablets by mouth 2 (two) times daily. 11/16/22     ondansetron  (ZOFRAN ) 4 MG tablet Take 1 tablet (4 mg total) by mouth every 6 (six) hours. 11/11/22     ondansetron  (ZOFRAN ) 4 MG tablet Take 1 tablet (4 mg total) by mouth every 6 (six) hours. 11/12/22     SUMAtriptan  (IMITREX ) 100 MG  tablet Take 1 tablet (100 mg total) by mouth as needed for migraine. May repeat in 2 hours if headache persists or recurs.  Maximum 2 tablets in 24 hours. 03/28/23   Skeet Juliene SAUNDERS, DO      Allergies    Amoxicillin, Rocephin [ceftriaxone], Succinylcholine , and Erythromycin    Review of Systems   Review of Systems  Constitutional:  Negative for fever.  Musculoskeletal:  Positive for arthralgias and joint swelling. Negative for myalgias.  Skin:  Positive for color change.  Neurological:  Negative for weakness and numbness.  All other systems reviewed and are negative.   Physical Exam Updated Vital Signs BP 117/73 (BP Location: Left Arm)   Pulse 77   Temp 98.2 F (36.8 C) (Oral)   Resp 14   Ht 5' (1.524 m)   Wt 68.9 kg   LMP 09/27/2019 Comment: spot occassionally,   SpO2 98%   BMI 29.67 kg/m  Physical Exam Constitutional:      Appearance: She is well-developed.  HENT:     Head: Atraumatic.  Cardiovascular:     Comments: Pulses equal bilaterally Musculoskeletal:        General: Tenderness present.     Right hand: Swelling present. Decreased range of motion. Normal sensation. Normal capillary refill. Normal pulse.     Cervical back: Normal range of motion.     Comments: Tender to palpation along  the right ring finger, PIP joint significantly edematous with bruising noted primarily at the volar joint space.  Distal sensation is intact with less than 2-second cap refill.  Nontender to palpation of the hand, difficulty with flexion secondary to pain, dorsiflexion does not elicit pain.  Skin:    General: Skin is warm and dry.  Neurological:     Mental Status: She is alert.     Sensory: No sensory deficit.     Motor: No weakness.     Deep Tendon Reflexes: Reflexes normal.     ED Results / Procedures / Treatments   Labs (all labs ordered are listed, but only abnormal results are displayed) Labs Reviewed - No data to display  EKG None  Radiology DG Finger Ring  Right Result Date: 05/22/2023 CLINICAL DATA:  Right fourth finger injury with swelling EXAM: RIGHT RING FINGER 2+V COMPARISON:  None Available. FINDINGS: Nondisplaced intra-articular volar plate fracture at the base of the middle phalanx in the right fourth finger with surrounding soft tissue swelling. No additional fracture. No dislocation. No focal osseous lesions. No significant degenerative change. No radiopaque foreign bodies. IMPRESSION: Nondisplaced intra-articular volar plate fracture at the base of the middle phalanx in the right fourth finger. Electronically Signed   By: Selinda DELENA Blue M.D.   On: 05/22/2023 11:15    Procedures Procedures    Medications Ordered in ED Medications  ibuprofen  (ADVIL ) tablet 800 mg (800 mg Oral Given 05/22/23 1105)    ED Course/ Medical Decision Making/ A&P                                 Medical Decision Making Patient presenting with pain, swelling and bruising of her dominant hand ring finger, differential diagnosis including sprain, dislocation, strain, fracture.  Imaging per below does reveal a nondisplaced fracture at the volar base of her second phalanx.  She presents with an aluminum finger splint which she is comfortable using.  We discussed ice and elevation, ibuprofen , she will need limited duty work note and close follow-up with orthopedics, she was referred to Dr. Onesimo for further evaluation and management of this injury.  Amount and/or Complexity of Data Reviewed Radiology: ordered.    Details: Nondisplaced intra-articular volar fracture right ring middle phalanx.  Reviewed and agree with interpretation.  Risk Prescription drug management.           Final Clinical Impression(s) / ED Diagnoses Final diagnoses:  Closed nondisplaced fracture of middle phalanx of finger of right hand    Rx / DC Orders ED Discharge Orders          Ordered    ibuprofen  (ADVIL ) 600 MG tablet  3 times daily        05/22/23 1138               Birdena Clarity, DEVONNA 05/22/23 1143    Towana Ozell BROCKS, MD 05/22/23 [phone]

## 2023-05-22 NOTE — ED Triage Notes (Signed)
 Pt c/o finger injury to rt ring finger. Pt states wrestling with son and unsure how she injured it, states was hurting a little and later that night starting hurting more. Pt finger is swollen at knuckle down and bruising to back of finger. Pt had finger wrapped and stabilized on arrival.

## 2023-05-22 NOTE — Discharge Instructions (Signed)
 You will need to wear your finger splint at all times as discussed until Dr Ernesta Heading guides you otherwise.  Elevation and ice can continue to help with pain and swelling. You will have limited use of your right hand for the next 4-6 weeks.

## 2023-05-24 ENCOUNTER — Encounter: Payer: Self-pay | Admitting: Orthopedic Surgery

## 2023-05-24 ENCOUNTER — Ambulatory Visit: Payer: Commercial Managed Care - PPO | Admitting: Orthopedic Surgery

## 2023-05-24 VITALS — BP 108/62 | HR 81 | Ht 61.0 in | Wt 132.0 lb

## 2023-05-24 DIAGNOSIS — S62654A Nondisplaced fracture of medial phalanx of right ring finger, initial encounter for closed fracture: Secondary | ICD-10-CM | POA: Diagnosis not present

## 2023-05-24 NOTE — Patient Instructions (Signed)
Note for work - out of work until the next visit    Splint on the finger and/or buddy taping at all times until the next visit   Once you have discussed everything with your work, please let me know if we need to adjust your paperwork

## 2023-05-24 NOTE — Progress Notes (Signed)
New Patient Visit  Assessment: Anna Willis is a 43 y.o. female with the following: Closed, nondisplaced fracture at the base of the right ring finger, middle phalanx  Plan: Anna Willis injured her right ring finger.  Radiographs obtained in the emergency department demonstrates a nondisplaced fracture at the base of the middle phalanx.  This is consistent with a volar plate injury, without displacement.  Continue with volar-based splint spanning the PIP joint, as well as buddy taping.  This was completed in clinic today.  Extra tape was provided.  Note for work was provided.  She is out of work until she gets clarification.  I am happy to provide documentation based on what she is able to do at work.  Follow-up in 2 weeks.   Follow-up: Return in about 2 weeks (around 06/07/2023).  Subjective:  Chief Complaint  Patient presents with   Hand Pain    Wrestling with her child and bent her ring finger right hand [facility]23  DOI 05/21/23    History of Present Illness: Anna Willis is a 43 y.o. female who presents for evaluation of right ring finger pain.  She states that she was wrestling with her son a couple of days ago.  Nothing specific happened.  She started to have pain.  She was evaluated the emergency department.  She was noted to have a small fracture at the base of the middle phalanx.  She has been in an lumbar foam splint since the ED visit.  She is taking ibuprofen.  She is a Engineer, civil (consulting) in the ED.  She has been unable to work.  She is right-hand dominant.   Review of Systems: No fevers or chills No numbness or tingling No chest pain No shortness of breath No bowel or bladder dysfunction No GI distress No headaches   Medical History:  Past Medical History:  Diagnosis Date   Acid reflux    Anxiety    Complication of anesthesia    muscle weakness after surgery      Migraine    Neurofibromatosis (HCC)    Scoliosis    Seasonal allergies    Tachycardia      Past Surgical History:  Procedure Laterality Date   ABDOMINAL HYSTERECTOMY  10/03/2019   Procedure: TOTAL ABDOMINAL HYSTERECTOMY;  Surgeon: [name], MD;  Location: [facility];  Service: Gynecology;;   BILATERAL SALPINGECTOMY  10/03/2019   Procedure: OPEN BILATERAL SALPINGECTOMY;  Surgeon: [name], MD;  Location: [facility];  Service: Gynecology;;   DILATION AND CURETTAGE OF UTERUS     LAPAROSCOPIC APPENDECTOMY N/A 07/18/2019   Procedure: APPENDECTOMY LAPAROSCOPIC;  Surgeon: Lucretia Roers, MD;  Location: [facility];  Service: General;  Laterality: N/A;   TOE AMPUTATION     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    WISDOM TOOTH EXTRACTION      Family History  Problem Relation Age of Onset   Migraines Mother    Epilepsy Mother    Endometriosis Mother        had hyst at age 67   Neurofibromatosis Father    Breast cancer Paternal Aunt    Breast cancer Paternal Aunt    Breast cancer Paternal Aunt    Dementia Maternal Grandmother    Kidney disease Maternal Grandmother    Other Maternal Grandmother        shingles   Heart attack Maternal Grandfather    Social History   Tobacco Use   Smoking  status: Former    Types: Cigarettes   Smokeless tobacco: Never  Vaping Use   Vaping status: Never Used  Substance Use Topics   Alcohol use: No   Drug use: No    Allergies  Allergen Reactions   Amoxicillin Nausea And Vomiting    Projectile vomiting   Rocephin [Ceftriaxone] Nausea And Vomiting   Succinylcholine Other (See Comments)    Muscle weakness/muscle pain.   Erythromycin Rash    Current Meds  Medication Sig   cetirizine (ZYRTEC ALLERGY) 10 MG tablet Take 1 tablet (10 mg total) by mouth daily.   Erenumab-aooe (AIMOVIG) 70 MG/ML SOAJ Inject 70 mg into the skin every 28 (twenty-eight) days.   esomeprazole (NEXIUM) 20 MG capsule Take 20 mg by mouth daily before breakfast.    ibuprofen (ADVIL) 600 MG tablet Take 1 tablet (600 mg total) by mouth 3 (three) times daily.    metoprolol succinate (TOPROL-XL) 25 MG 24 hr tablet Take 1 tablet (25 mg total) by mouth daily.   Naltrexone-buPROPion HCl ER (CONTRAVE) 8-90 MG TB12 Take 2 tablets by mouth 2 (two) times daily.   ondansetron (ZOFRAN) 4 MG tablet Take 1 tablet (4 mg total) by mouth every 6 (six) hours.   ondansetron (ZOFRAN) 4 MG tablet Take 1 tablet (4 mg total) by mouth every 6 (six) hours.   SUMAtriptan (IMITREX) 100 MG tablet Take 1 tablet (100 mg total) by mouth as needed for migraine. May repeat in 2 hours if headache persists or recurs.  Maximum 2 tablets in 24 hours.    Objective: BP 108/62   Pulse 81   Ht 5\' 1"  (1.549 m)   Wt 132 lb (59.9 kg)   LMP 09/27/2019 Comment: spot occassionally,   BMI 24.94 kg/m   Physical Exam:  General: Alert and oriented. and No acute distress. Gait: Normal gait.  Evaluation of the right ring finger demonstrates diffuse swelling.  She is tenderness to palpation over the PIP joint.  She is able to flex the PIP joint, but is limited due to pain.  Fingers are warm and well-perfused.  No injuries to other fingers.  No swelling.  Sensation is intact throughout the right hand.  IMAGING: I personally reviewed images previously obtained from the ED .  X-rays of the right hand were previously obtained.  Best viewed on the lateral, there is a minimally displaced fracture at the base of the middle phalanx.  No dislocation.  No additional injuries.   New Medications:  No orders of the defined types were placed in this encounter.     Oliver Barre, MD  05/24/2023 11:21 AM

## 2023-05-25 ENCOUNTER — Telehealth: Payer: Self-pay | Admitting: Neurology

## 2023-05-25 ENCOUNTER — Other Ambulatory Visit (HOSPITAL_COMMUNITY): Payer: Self-pay

## 2023-05-25 ENCOUNTER — Telehealth: Payer: Self-pay | Admitting: Orthopedic Surgery

## 2023-05-25 NOTE — Telephone Encounter (Signed)
Pt's spouse called in stating the pt needs a PA for her Aimovig

## 2023-05-25 NOTE — Telephone Encounter (Signed)
Dr. Dallas Schimke pt - spoke w/the patient, she is needing a work note stating, no lifting, no pushing, no pulling and nothing strenuous for 4 weeks.  Please advise.

## 2023-05-26 ENCOUNTER — Encounter: Payer: Self-pay | Admitting: Orthopedic Surgery

## 2023-05-26 NOTE — Telephone Encounter (Signed)
Letter has been done and is up front, pt advised.

## 2023-05-26 NOTE — Progress Notes (Unsigned)
NEUROLOGY FOLLOW UP OFFICE NOTE  Anna Willis [id number]  Assessment/Plan:   Migraine without aura, without status migrainosus, not intractable Vertigo, resolved. Neurofibromatosis, type 1    Migraine prevention:  Aimovig 70mg  every 28 days  Migraine rescue: sumatriptan 100mg   Limit use of pain relievers to no more than 2 days out of week to prevent risk of rebound or medication-overuse headache. Keep headache diary Recommend continue annual eye exam Follow up one year or as needed.       Subjective:  Anna Willis is a 43 year old right-handed female who follows up for Neurofibromatosis and migraines.   UPDATE:  Intensity:  severe Duration:  within 1 hour with sumatriptan, however if triggered by environmental factor (perfume, heat), needs to repeat dose.   Frequency:  1 to 2 a month (usually right before next Aimovig injection)  Has modified diet.  Lost weight.  Feeling better. Still dealing with constipation.  Takes stool softener and Metamucil.   Last visit, she reported recurrence of vertigo.  She was referred to PT for vestibular rehab.  It has been controlled for awhile.    Rescue protocol:  rizatriptan first line, Excedrin second line - migraines usually return afterwards. Current NSAIDS/analgesics:  Excedrin, Celebrex (for bodyaches) Current triptans:  sumatriptan 100mg  Current ergotamine:  none Current anti-emetic:  Zofran 4mg  Current muscle relaxants:  none Current Antihypertensive medications:  metoprolol Current Antidepressant medications:  Wellbutrin 300mg  Current Anticonvulsant medications:  none Current anti-CGRP:  Aimovig 70mg  Q4wks  Current Vitamins/Herbal/Supplements:  none Current Antihistamines/Decongestants:  Benadryl Other therapy:  none Birth control:  none  Caffeine:  6 oz coffee daily.  Rarely tea. No more than 1 cola or Dr. Reino Kent a day. Diet:  Hydrates.  Losing weight.  Trying to eat more at home and cut out junk food.  Increase  protein.   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  She was diagnosed with Neurofibromatosis type 1 as a child.  Father has it as well.  She exhibits cafe au lait spots and subcutaneous neurofibromas.  Overall no symptoms.  Legs always feel fatigued with decreased tone but no significant weakness, pain, visual disturbance.  She does get annual eye exams.  She does report some low back pain, no radiation down the legs.     She has history of migraines since childhood.  They particularly became frequent around 2015.  In 2016, she was noted to have elevated prolactin levels.  MRI of brain and pituitary gland with and without contrast on 01/08/2015 was normal and revealed no pituitary lesion.  Migraine are severe right -sided pounding headache/retro-orbital.  No preceding aura.  Associated with nausea, photophobia, phonophobia, osmophobia.  No vomiting, visual disturbance, numbness or weakness.  Migraines usually last 3 hours off and on for about 3 days and occur 2-3 a month since being on Aimovig (used to be 12-15 days a month).  Triggers include heat, strong smells (candles, soaps, perfumes), emotional stress, loud noise exposure.   Also has tension type bandlike.  They occur 2-3 times a month but will last most of the day.  Usually treats with Excedrin.   She has been experiencing positional dizziness since having adenovirus infection 6 weeks ago.  She had associated conjunctivitis.  She was quite dizzy at that time.  It is  a sensation of room or self movement.  She has not fallen at work but is afraid that she might.  They last 10 seconds.  Triggered by changing positions such as bending over or looking up to reach for anything.  She also notes that Excedrin triggers the dizziness.  She does have a shooting pain up  the right side of her jaw. Her jaw does typically "pop".  And in her ear  Saw dentist who told her that she "bruised her tooth.  She was referred to vestibular rehab.  Gilberto Better was negative.  Responded to physical therapy.      Past NSAIDS/analgesics:  ibuprofen, Tylenol Past abortive triptans:   rizatriptan (ineffective and caused frequent urinary urgency) Past abortive ergotamine:  none Past muscle relaxants:  Flexeril, Zanflex Past anti-emetic:  promethazine Past antihypertensive medications:  none Past antidepressant medications:  venlafaxine (side effects), sertraline Past anticonvulsant medications:  none Past anti-CGRP:  none Other past therapies:  none    Family history:  Father (NF 1), mother (epilepsy)  PAST MEDICAL HISTORY: Past Medical History:  Diagnosis Date   Acid reflux    Anxiety    Complication of anesthesia    muscle weakness after surgery      Migraine    Neurofibromatosis (HCC)    Scoliosis    Seasonal allergies    Tachycardia     MEDICATIONS: Current Outpatient Medications on File Prior to Visit  Medication Sig Dispense Refill   cetirizine (ZYRTEC ALLERGY) 10 MG tablet Take 1 tablet (10 mg total) by mouth daily. 30 tablet 0   Erenumab-aooe (AIMOVIG) 70 MG/ML SOAJ Inject 70 mg into the skin every 28 (twenty-eight) days. 3 mL 3   esomeprazole (NEXIUM) 20 MG capsule Take 20 mg by mouth daily before breakfast.      ibuprofen (ADVIL) 600 MG tablet Take 1 tablet (600 mg total) by mouth 3 (three) times daily. 30 tablet 0   metoprolol succinate (TOPROL-XL) 25 MG 24 hr tablet Take 1 tablet (25 mg total) by mouth daily. 90 tablet 3   Naltrexone-buPROPion HCl ER (CONTRAVE) 8-90 MG TB12 Take 1 tablet every morning for 1week, then 1 tablet twice a day x1week, then 2 tablets every morning and 1 tablet at bedtime x 1week, then 2 tablets twice a day. 63 tablet 0   Naltrexone-buPROPion HCl ER (CONTRAVE) 8-90 MG TB12 Take 2 tablets by mouth 2 (two) times daily. 120  tablet 11   ondansetron (ZOFRAN) 4 MG tablet Take 1 tablet (4 mg total) by mouth every 6 (six) hours. 40 tablet 9   ondansetron (ZOFRAN) 4 MG tablet Take 1 tablet (4 mg total) by mouth every 6 (six) hours. 40 tablet 9   SUMAtriptan (IMITREX) 100 MG tablet Take 1 tablet (100 mg total) by mouth as needed for migraine. May repeat in 2 hours if headache persists or recurs.  Maximum 2 tablets in 24 hours. 10 tablet 0   No current facility-administered medications on file prior to visit.     ALLERGIES: Allergies  Allergen Reactions   Amoxicillin Nausea And Vomiting    Projectile vomiting   Rocephin [Ceftriaxone] Nausea And Vomiting   Succinylcholine Other (See Comments)    Muscle weakness/muscle pain.   Erythromycin Rash    FAMILY HISTORY: Family History  Problem Relation Age of Onset   Migraines Mother    Epilepsy Mother    Endometriosis Mother        had hyst at age 33   Neurofibromatosis Father  Breast cancer Paternal Aunt    Breast cancer Paternal Aunt    Breast cancer Paternal Aunt    Dementia Maternal Grandmother    Kidney disease Maternal Grandmother    Other Maternal Grandmother        shingles   Heart attack Maternal Grandfather       Objective:  Blood pressure 120/78, pulse 75, height 5\' 1"  (1.549 m), weight 128 lb (58.1 kg), last menstrual period 09/27/2019, SpO2 99%. General: No acute distress.  Patient appears well-groomed.   Head:  Normocephalic/atraumatic Neck:  Supple.  No paraspinal tenderness.  Full range of motion. Heart:  Regular rate and rhythm. Neuro:  Alert and oriented.  Speech fluent and not dysarthric.  Language intact.  CN II-XII intact.  Bulk and tone normal.  Muscle strength 5/5 throughout.  Deep tendon reflexes 2+ throughout.  Gait normal.  Romberg negative.    Shon Millet, DO

## 2023-05-27 ENCOUNTER — Encounter: Payer: Self-pay | Admitting: Neurology

## 2023-05-27 ENCOUNTER — Other Ambulatory Visit (HOSPITAL_COMMUNITY): Payer: Self-pay

## 2023-05-27 ENCOUNTER — Ambulatory Visit: Payer: Commercial Managed Care - PPO | Admitting: Neurology

## 2023-05-27 VITALS — BP 120/78 | HR 75 | Ht 61.0 in | Wt 128.0 lb

## 2023-05-27 DIAGNOSIS — G43009 Migraine without aura, not intractable, without status migrainosus: Secondary | ICD-10-CM | POA: Diagnosis not present

## 2023-05-27 DIAGNOSIS — Q8501 Neurofibromatosis, type 1: Secondary | ICD-10-CM

## 2023-05-27 DIAGNOSIS — H8110 Benign paroxysmal vertigo, unspecified ear: Secondary | ICD-10-CM

## 2023-05-27 MED ORDER — SUMATRIPTAN SUCCINATE 100 MG PO TABS
100.0000 mg | ORAL_TABLET | ORAL | 5 refills | Status: AC | PRN
  Filled 2023-05-27: qty 9, 30d supply, fill #0
  Filled 2023-08-15: qty 9, 30d supply, fill #1
  Filled 2023-10-17: qty 9, 30d supply, fill #2
  Filled 2024-02-01: qty 9, 30d supply, fill #3
  Filled 2024-04-24: qty 9, 30d supply, fill #4
  Filled 2024-04-24: qty 9, 30d supply, fill #0

## 2023-05-31 ENCOUNTER — Telehealth: Payer: Self-pay

## 2023-05-31 NOTE — Telephone Encounter (Signed)
 PA needed Aimovig 70 mg

## 2023-06-07 ENCOUNTER — Other Ambulatory Visit (HOSPITAL_COMMUNITY): Payer: Self-pay

## 2023-06-08 ENCOUNTER — Encounter: Payer: Commercial Managed Care - PPO | Admitting: Orthopedic Surgery

## 2023-06-09 ENCOUNTER — Other Ambulatory Visit (HOSPITAL_COMMUNITY): Payer: Self-pay

## 2023-06-09 ENCOUNTER — Telehealth: Payer: Self-pay

## 2023-06-09 ENCOUNTER — Encounter: Payer: Self-pay | Admitting: Neurology

## 2023-06-09 NOTE — Telephone Encounter (Signed)
 Pt. Wife calling for info on PA for Aimovig

## 2023-06-09 NOTE — Telephone Encounter (Signed)
 Prednisone taper wanted.

## 2023-06-10 ENCOUNTER — Other Ambulatory Visit: Payer: Self-pay | Admitting: Neurology

## 2023-06-10 ENCOUNTER — Other Ambulatory Visit (HOSPITAL_COMMUNITY): Payer: Self-pay

## 2023-06-10 MED ORDER — PREDNISONE 10 MG PO TABS
ORAL_TABLET | ORAL | 0 refills | Status: AC
Start: 2023-06-10 — End: 2023-06-16
  Filled 2023-06-10: qty 21, 6d supply, fill #0

## 2023-06-10 NOTE — Telephone Encounter (Signed)
 Patient advised via mychart.

## 2023-06-13 ENCOUNTER — Telehealth: Payer: Self-pay | Admitting: Neurology

## 2023-06-13 ENCOUNTER — Telehealth: Payer: Self-pay | Admitting: Pharmacy Technician

## 2023-06-13 ENCOUNTER — Other Ambulatory Visit (HOSPITAL_COMMUNITY): Payer: Self-pay

## 2023-06-13 NOTE — Telephone Encounter (Signed)
 Medication Samples have been provided to the patient.  Drug name: Aimovig        Strength: 70 mg        Qty: 1  LOT: [id number]  Exp.Date: 09/10/23  Dosing instructions: every 28 days  The patient has been instructed regarding the correct time, dose, and frequency of taking this medication, including desired effects and most common side effects.   Anna Willis 1:39 PM 06/13/2023

## 2023-06-13 NOTE — Telephone Encounter (Signed)
 Pharmacy Patient Advocate Encounter   Received notification from CoverMyMeds that prior authorization for AIMOVIG 70MG  is required/requested.   Insurance verification completed.   The patient is insured through [facility] .   Per test claim: PA required; PA submitted to above mentioned insurance via CoverMyMeds Key/confirmation #/EOC [id number] Status is pending

## 2023-06-13 NOTE — Telephone Encounter (Signed)
 Followed up with patient's wife Diani Jillson and gave her status update on the prior authorization for the Aimovig. She is aware that the PA has been submitted as expedited and someone will reach out once we obtain the approval. A sample of the Aimovig was requested so I informed Lashell E that we would have a sample available that they could stop by and pick up at their convenience. I inquired if the patient currently had a migraine and Doralyn E stated not at this time. Declined the need for a prednisone taper, she only needed the sample. Tasmia E verbalized understanding and the call was ended.

## 2023-06-15 IMAGING — DX DG CHEST 1V PORT
1 series · 1 of 1 positions shown · non-contrast
Comparison: CT January 23, 2020

CLINICAL DATA: Cough, nausea and constipation.

EXAM:
PORTABLE CHEST 1 VIEW

[chest ap]
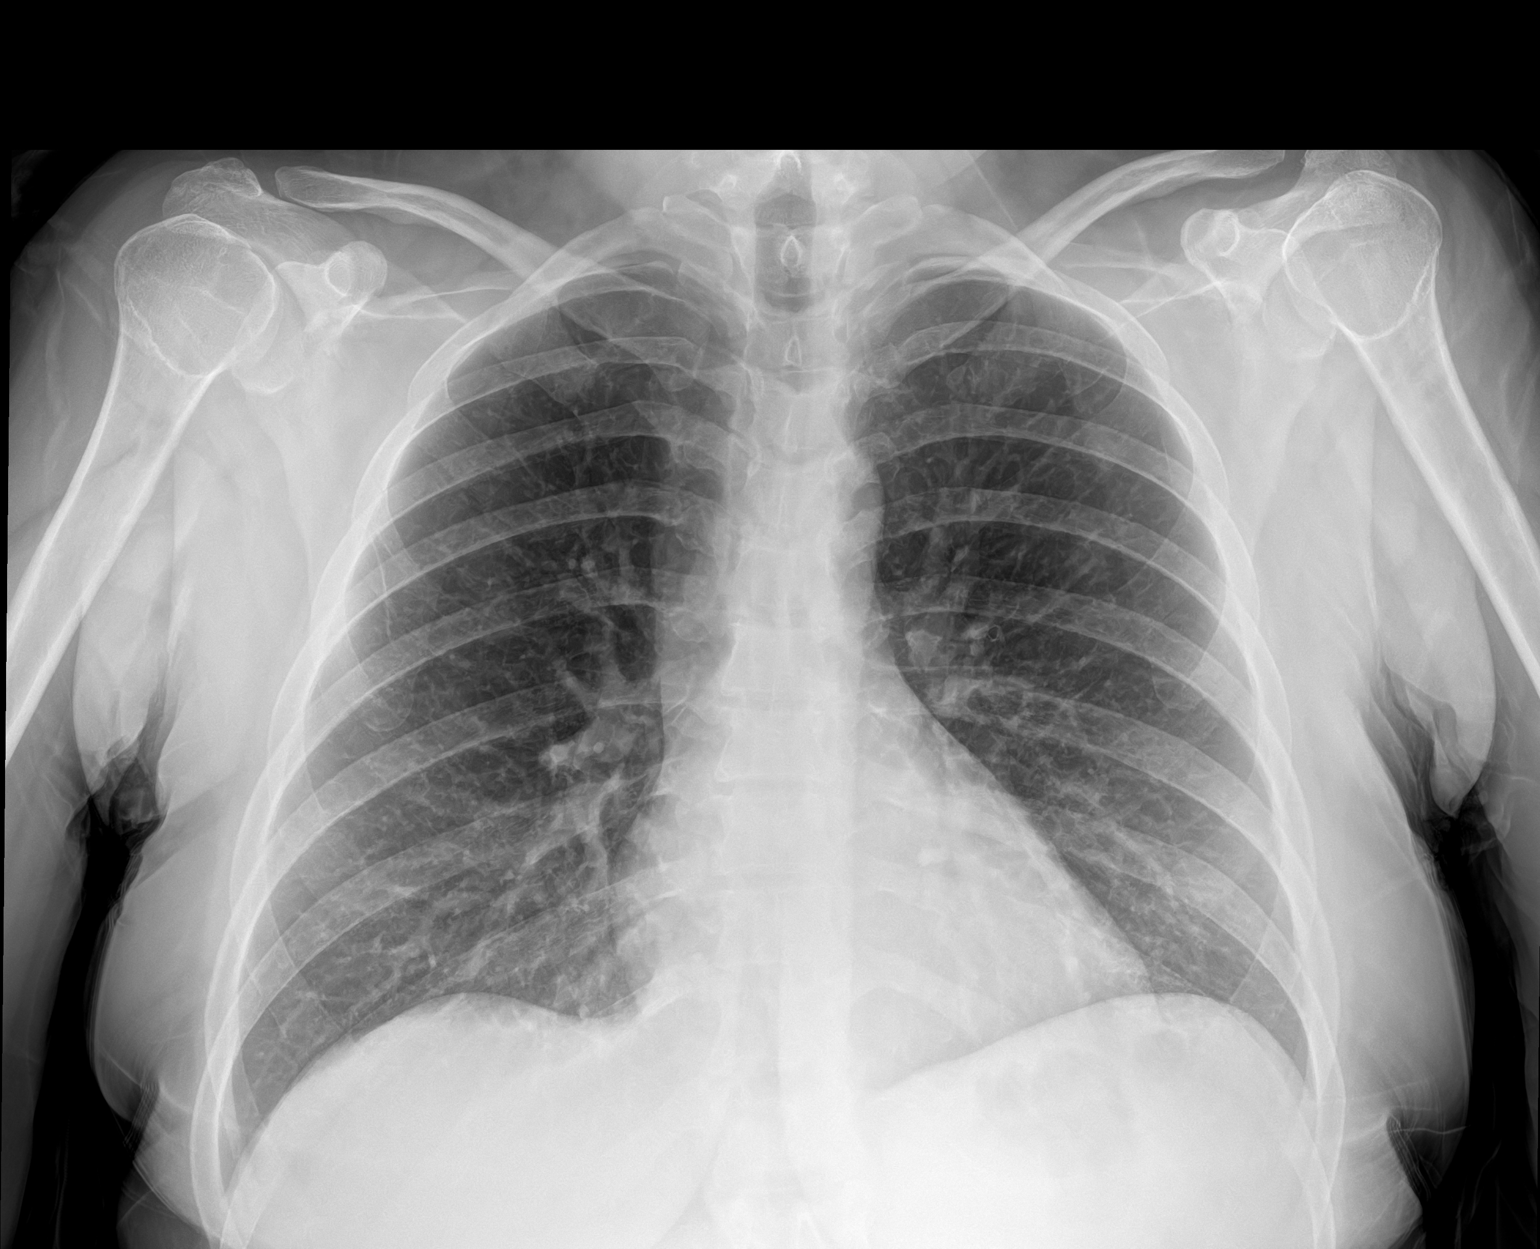

[1 of 1 positions shown; findings below may reference images not displayed]

FINDINGS: The heart size and mediastinal contours are within normal limits. No
focal airspace consolidation. No visible pleural effusion or
pneumothorax. The visualized skeletal structures are unremarkable.
IMPRESSION: No acute cardiopulmonary disease.

## 2023-06-16 NOTE — Telephone Encounter (Signed)
 Approval letter received from [employer].  Approved 06/15/23-12/12/23 for Aimovig 70 mg.

## 2023-06-22 ENCOUNTER — Other Ambulatory Visit (HOSPITAL_COMMUNITY): Payer: Self-pay

## 2023-06-24 ENCOUNTER — Other Ambulatory Visit (INDEPENDENT_AMBULATORY_CARE_PROVIDER_SITE_OTHER): Payer: Self-pay

## 2023-06-24 ENCOUNTER — Encounter: Payer: Self-pay | Admitting: Orthopedic Surgery

## 2023-06-24 ENCOUNTER — Ambulatory Visit: Admitting: Orthopedic Surgery

## 2023-06-24 ENCOUNTER — Other Ambulatory Visit (HOSPITAL_COMMUNITY): Payer: Self-pay

## 2023-06-24 DIAGNOSIS — S62654A Nondisplaced fracture of medial phalanx of right ring finger, initial encounter for closed fracture: Secondary | ICD-10-CM | POA: Diagnosis not present

## 2023-06-24 DIAGNOSIS — S62654D Nondisplaced fracture of medial phalanx of right ring finger, subsequent encounter for fracture with routine healing: Secondary | ICD-10-CM

## 2023-06-24 NOTE — Progress Notes (Signed)
 Return patient Visit  Assessment: Anna Willis is a 43 y.o. female with the following: Closed, nondisplaced fracture at the base of the right ring finger, middle phalanx  Plan: Anna Willis sustained an injury to the right ring finger, approximately 5-6 weeks ago.  She has been using a removable splint, and buddy taping her fingers.  Radiographs obtained today look very good.  There has been no interval displacement.  She does continue to have some residual swelling about the PIP joint.  There is some redness, and small amount of swelling on the radial aspect of the PIP joint, which I believe is some irritation associated with the brace wear.  The PIP joint is stable to radial stress.  She has good range of motion, but is lacking strength.  I provided reassurance.  We reviewed radiographs.  Overall, I think she has done very well.  She can transition out of the brace.  I recommend buddy taping if she wishes to protect her fingers at work.  She can continue with restricted activities at work for the next 2 weeks, but she can return to full duties at work at the end of the month.  I would like see her back in about 1 month.   Follow-up: Return in about 4 weeks (around 07/22/2023).  Subjective:  Chief Complaint  Patient presents with   Follow-up    Recheck on right ring finger, DOI 05-21-23.    History of Present Illness: Anna Willis is a 43 y.o. female who returns for evaluation of right ring finger pain.  She injured her right finger approximately 5-6 weeks ago.  She has been using a removable brace.  She is also been buddy taping her fingers.  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  Overall, she feels better.  She has noticed the small area of redness, and some swelling which is concerning to her.  She has no numbness or tingling.  Review of Systems: No fevers or chills No numbness or tingling No chest pain No shortness of breath No bowel  or bladder dysfunction No GI distress No headaches     Objective: LMP 09/27/2019 Comment: spot occassionally,   Physical Exam:  General: Alert and oriented. and No acute distress. Gait: Normal gait.  Right ring finger with a small amount of swelling about the PIP joint.  There is no bruising.  Mild tenderness to palpation over the volar aspect of the PIP joint.  There is a small area of redness, and swelling on the radial aspect of the PIP joint.  No specific tenderness in this area.  No increased laxity to radial or ulnar stress of the PIP joint.   IMAGING: I personally ordered and reviewed the following images   X-rays of the right ring finger were obtained in clinic today.  These are compared to available images.  Fracture of the volar tip of the proximal aspect of the middle phalanx remains nondisplaced.  There is no obvious callus formation.  No additional injuries.  PIP joint is reduced.  No subluxation.  No additional injuries.  Impression: Stable right ring finger, middle phalanx fracture   New Medications:  No orders of the defined types were placed in this encounter.     Oliver Barre, MD  06/24/2023 12:05 PM

## 2023-06-24 NOTE — Patient Instructions (Addendum)
 Note for work - continue with reduced work restrictions for an additional 2 weeks.  Can return without restrictions on 07/11/23    Hand Exercises  Hand exercises can be helpful for almost anyone. These exercises can strengthen the hands, improve flexibility and movement, and increase blood flow to the hands. These results can make work and daily tasks easier. Hand exercises can be especially helpful for people who have joint pain from arthritis or have nerve damage from overuse (carpal tunnel syndrome). These exercises can also help people who have injured a hand.  Exercises Most of these hand exercises are gentle stretching and motion exercises. It is usually safe to do them often throughout the day. Warming up your hands before exercise may help to reduce stiffness. You can do this with gentle massage or by placing your hands in warm water for 10-15 minutes. It is normal to feel some stretching, pulling, tightness, or mild discomfort as you begin new exercises. This will gradually improve. Stop an exercise right away if you feel sudden, severe pain or your pain gets worse. Ask your health care provider which exercises are best for you. Knuckle bend or "claw" fist Stand or sit with your arm, hand, and all five fingers pointed straight up. Make sure to keep your wrist straight during the exercise. Gently bend your fingers down toward your palm until the tips of your fingers are touching the top of your palm. Keep your big knuckle straight and just bend the small knuckles in your fingers. Hold this position for 10 seconds. Straighten (extend) your fingers back to the starting position. Repeat this exercise 5-10 times with each hand. Full finger fist Stand or sit with your arm, hand, and all five fingers pointed straight up. Make sure to keep your wrist straight during the exercise. Gently bend your fingers into your palm until the tips of your fingers are touching the middle of your palm. Hold  this position for 10 seconds. Extend your fingers back to the starting position, stretching every joint fully. Repeat this exercise 5-10 times with each hand. Straight fist Stand or sit with your arm, hand, and all five fingers pointed straight up. Make sure to keep your wrist straight during the exercise. Gently bend your fingers at the big knuckle, where your fingers meet your hand, and the middle knuckle. Keep the knuckle at the tips of your fingers straight and try to touch the bottom of your palm. Hold this position for 10 seconds. Extend your fingers back to the starting position, stretching every joint fully. Repeat this exercise 5-10 times with each hand. Tabletop Stand or sit with your arm, hand, and all five fingers pointed straight up. Make sure to keep your wrist straight during the exercise. Gently bend your fingers at the big knuckle, where your fingers meet your hand, as far down as you can while keeping the small knuckles in your fingers straight. Think of forming a tabletop with your fingers. Hold this position for 10 seconds. Extend your fingers back to the starting position, stretching every joint fully. Repeat this exercise 5-10 times with each hand. Finger spread Place your hand flat on a table with your palm facing down. Make sure your wrist stays straight as you do this exercise. Spread your fingers and thumb apart from each other as far as you can until you feel a gentle stretch. Hold this position for 10 seconds. Bring your fingers and thumb tight together again. Hold this position for 10 seconds. Repeat  this exercise 5-10 times with each hand. Making circles Stand or sit with your arm, hand, and all five fingers pointed straight up. Make sure to keep your wrist straight during the exercise. Make a circle by touching the tip of your thumb to the tip of your index finger. Hold for 10 seconds. Then open your hand wide. Repeat this motion with your thumb and each finger  on your hand. Repeat this exercise 5-10 times with each hand. Thumb motion Sit with your forearm resting on a table and your wrist straight. Your thumb should be facing up toward the ceiling. Keep your fingers relaxed as you move your thumb. Lift your thumb up as high as you can toward the ceiling. Hold for 10 seconds. Bend your thumb across your palm as far as you can, reaching the tip of your thumb for the small finger (pinkie) side of your palm. Hold for 10 seconds. Repeat this exercise 5-10 times with each hand. Grip strengthening Hold a stress ball or other soft ball in the middle of your hand. Slowly increase the pressure, squeezing the ball as much as you can without causing pain. Think of bringing the tips of your fingers into the middle of your palm. All of your finger joints should bend when doing this exercise. Hold your squeeze for 10 seconds, then relax. Repeat this exercise 5-10 times with each hand.   Contact a health care provider if: Your hand pain or discomfort gets much worse when you do an exercise. Your hand pain or discomfort does not improve within 2 hours after you exercise. If you have any of these problems, stop doing these exercises right away. Do not do them again unless your health care provider says that you can.    Get help right away if: You develop sudden, severe hand pain or swelling. If this happens, stop doing these exercises right away. Do not do them again unless your health care provider says that you can. This information is not intended to replace advice given to you by your health care provider. Make sure you discuss any questions you have with your health care provider.

## 2023-06-27 ENCOUNTER — Other Ambulatory Visit (HOSPITAL_COMMUNITY): Payer: Self-pay

## 2023-06-28 ENCOUNTER — Other Ambulatory Visit (HOSPITAL_COMMUNITY): Payer: Self-pay

## 2023-06-28 MED ORDER — ALBUTEROL SULFATE HFA 108 (90 BASE) MCG/ACT IN AERS
2.0000 | INHALATION_SPRAY | Freq: Four times a day (QID) | RESPIRATORY_TRACT | 0 refills | Status: AC | PRN
  Filled 2023-06-28: qty 6.7, 16d supply, fill #0

## 2023-07-14 ENCOUNTER — Other Ambulatory Visit (HOSPITAL_COMMUNITY): Payer: Self-pay

## 2023-07-22 ENCOUNTER — Ambulatory Visit: Admitting: Orthopedic Surgery

## 2023-07-22 ENCOUNTER — Other Ambulatory Visit (INDEPENDENT_AMBULATORY_CARE_PROVIDER_SITE_OTHER): Payer: Self-pay

## 2023-07-22 ENCOUNTER — Encounter: Payer: Self-pay | Admitting: Orthopedic Surgery

## 2023-07-22 DIAGNOSIS — S62654D Nondisplaced fracture of medial phalanx of right ring finger, subsequent encounter for fracture with routine healing: Secondary | ICD-10-CM

## 2023-07-22 NOTE — Progress Notes (Signed)
 Return patient Visit  Assessment: Anna Willis is a 43 y.o. female with the following: Closed, nondisplaced fracture at the base of the right ring finger, middle phalanx  Plan: Anna Willis sustained an injury to the right ring finger, a couple of months ago.  She is able to do most things, including work activities.  She does continue to have some swelling, as well as pain in the finger.  The more she works, the worse the swelling gets.  She is working on range of motion.  Encouraged her to continue working on strengthening, and focusing on swelling control.  Anticipate gradual resolution of symptoms.  She will follow-up as needed.   Follow-up: Return if symptoms worsen or fail to improve.  Subjective:  Chief Complaint  Patient presents with   Post-op Follow-up    Right ring finger    History of Present Illness: Anna Willis is a 43 y.o. female who returns for evaluation of right ring finger pain.  She injured her right finger 1 months ago.  She has returned to work.  She is able to do most things.  She does still have some pain, and swelling.  She is not quite able to make a tight closed fist at this time.  No recent injuries.  Review of Systems: No fevers or chills No numbness or tingling No chest pain No shortness of breath No bowel or bladder dysfunction No GI distress No headaches     Objective: LMP 09/27/2019 Comment: spot occassionally,   Physical Exam:  General: Alert and oriented. and No acute distress. Gait: Normal gait.  Right ring finger with a small amount of swelling about the PIP joint.  There is some redness over the radial aspect of the PIP joint.  Almost able to make a tight fist.  Fingers are warm well-perfused.  Sensation is intact.  IMAGING: I personally ordered and reviewed the following images   Rays of the right ring finger were obtained in clinic today.  These are compared to prior images.  There is a minimally displaced fracture at the  volar tip of the middle phalanx.  This has remained stable.  No further displacement.  It is healed.  No bony lesions.  Impression: Healed right ring finger middle phalanx fracture   New Medications:  No orders of the defined types were placed in this encounter.     Oliver Barre, MD  07/22/2023 9:05 AM

## 2023-07-22 NOTE — Patient Instructions (Signed)
 Working on strengthening, and range of motion of the right hand.  No restrictions.  Medicines as needed.   Dissipate swelling and range of motion and pain to all improved with time.

## 2023-08-15 ENCOUNTER — Other Ambulatory Visit (HOSPITAL_COMMUNITY): Payer: Self-pay

## 2023-09-15 ENCOUNTER — Other Ambulatory Visit (HOSPITAL_COMMUNITY): Payer: Self-pay

## 2023-09-21 ENCOUNTER — Other Ambulatory Visit (HOSPITAL_COMMUNITY): Payer: Self-pay

## 2023-10-03 ENCOUNTER — Other Ambulatory Visit (HOSPITAL_COMMUNITY): Payer: Self-pay

## 2023-10-04 ENCOUNTER — Ambulatory Visit: Payer: Commercial Managed Care - PPO | Admitting: Neurology

## 2023-10-17 ENCOUNTER — Other Ambulatory Visit (HOSPITAL_COMMUNITY): Payer: Self-pay

## 2023-11-17 ENCOUNTER — Other Ambulatory Visit (HOSPITAL_COMMUNITY): Payer: Self-pay

## 2023-11-17 MED ORDER — NALTREXONE-BUPROPION HCL ER 8-90 MG PO TB12
2.0000 | ORAL_TABLET | Freq: Two times a day (BID) | ORAL | 4 refills | Status: AC
  Filled 2023-11-17: qty 120, 30d supply, fill #0
  Filled 2024-04-24: qty 120, 30d supply, fill #1
  Filled 2024-04-24: qty 120, 30d supply, fill #0

## 2023-11-18 ENCOUNTER — Other Ambulatory Visit (HOSPITAL_COMMUNITY): Payer: Self-pay

## 2023-11-18 MED ORDER — CONTRAVE 8-90 MG PO TB12
2.0000 | ORAL_TABLET | Freq: Two times a day (BID) | ORAL | 11 refills | Status: AC
  Filled 2023-12-23 – 2023-12-29 (×3): qty 120, 30d supply, fill #0
  Filled 2024-01-25: qty 120, 30d supply, fill #1

## 2023-12-07 ENCOUNTER — Other Ambulatory Visit (HOSPITAL_COMMUNITY): Payer: Self-pay

## 2023-12-07 MED ORDER — METOPROLOL SUCCINATE ER 25 MG PO TB24
25.0000 mg | ORAL_TABLET | Freq: Every day | ORAL | 0 refills | Status: AC
  Filled 2023-12-07 – 2024-03-12 (×4): qty 90, 90d supply, fill #0

## 2023-12-07 MED ORDER — METOPROLOL SUCCINATE ER 25 MG PO TB24
25.0000 mg | ORAL_TABLET | Freq: Every day | ORAL | 0 refills | Status: AC
  Filled 2023-12-07: qty 90, 90d supply, fill #0

## 2023-12-08 ENCOUNTER — Other Ambulatory Visit (HOSPITAL_COMMUNITY): Payer: Self-pay

## 2023-12-23 ENCOUNTER — Other Ambulatory Visit (HOSPITAL_COMMUNITY): Payer: Self-pay

## 2023-12-23 ENCOUNTER — Other Ambulatory Visit: Payer: Self-pay

## 2023-12-29 ENCOUNTER — Other Ambulatory Visit (HOSPITAL_COMMUNITY): Payer: Self-pay

## 2024-01-19 ENCOUNTER — Other Ambulatory Visit (HOSPITAL_COMMUNITY): Payer: Self-pay

## 2024-01-24 ENCOUNTER — Other Ambulatory Visit (HOSPITAL_COMMUNITY): Payer: Self-pay

## 2024-01-24 DIAGNOSIS — K219 Gastro-esophageal reflux disease without esophagitis: Secondary | ICD-10-CM | POA: Diagnosis not present

## 2024-01-24 DIAGNOSIS — G43909 Migraine, unspecified, not intractable, without status migrainosus: Secondary | ICD-10-CM | POA: Diagnosis not present

## 2024-01-24 DIAGNOSIS — K5909 Other constipation: Secondary | ICD-10-CM | POA: Diagnosis not present

## 2024-01-24 DIAGNOSIS — Z6824 Body mass index (BMI) 24.0-24.9, adult: Secondary | ICD-10-CM | POA: Diagnosis not present

## 2024-01-24 MED ORDER — ONDANSETRON HCL 4 MG PO TABS
ORAL_TABLET | ORAL | 3 refills | Status: AC
  Filled 2024-01-24 – 2024-01-25 (×2): qty 30, 30d supply, fill #0
  Filled 2024-04-24: qty 30, 30d supply, fill #1
  Filled 2024-04-24: qty 30, 30d supply, fill #0

## 2024-01-24 MED ORDER — CONTRAVE 8-90 MG PO TB12
2.0000 | ORAL_TABLET | Freq: Two times a day (BID) | ORAL | 3 refills | Status: AC
  Filled 2024-01-24 – 2024-01-27 (×2): qty 120, 30d supply, fill #0
  Filled 2024-03-12: qty 120, 30d supply, fill #1

## 2024-01-25 ENCOUNTER — Other Ambulatory Visit (HOSPITAL_COMMUNITY): Payer: Self-pay

## 2024-01-25 ENCOUNTER — Telehealth: Payer: Self-pay | Admitting: Neurology

## 2024-01-25 ENCOUNTER — Telehealth: Payer: Self-pay | Admitting: Pharmacy Technician

## 2024-01-25 ENCOUNTER — Other Ambulatory Visit: Payer: Self-pay

## 2024-01-25 NOTE — Telephone Encounter (Signed)
 Pt's husband lefted a message stating that they tried to pick up the prescription called: Aimovig . Pt was told that they need a prior authorization for that. Pt is asking for samples of the Aimovig  until everything is worked out. Thanks

## 2024-01-25 NOTE — Telephone Encounter (Signed)
 Pharmacy Patient Advocate Encounter   Received notification from Pt Calls Messages that prior authorization for AIMOVIG  140MG  is required/requested.   Insurance verification completed.   The patient is insured through [facility].   Per test claim: PA required; PA submitted to above mentioned insurance via Latent Key/confirmation #/EOC [id number] Status is pending

## 2024-01-25 NOTE — Telephone Encounter (Signed)
 PA team please start a PA for Aimovig.

## 2024-01-25 NOTE — Telephone Encounter (Signed)
 PA has been submitted, and telephone encounter has been created. Please see telephone encounter dated 10.15.25. SENT EXPEDITED

## 2024-01-26 ENCOUNTER — Other Ambulatory Visit (HOSPITAL_COMMUNITY): Payer: Self-pay

## 2024-01-26 NOTE — Telephone Encounter (Signed)
 Pharmacy Patient Advocate Encounter  Received notification from [employer] that Prior Authorization for AIMOVIG  140MG  has been APPROVED from 10.16.25 to 10.5.26. Ran test claim, Copay is $125. This test claim was processed through [facility]- copay amounts may vary at other pharmacies due to pharmacy/plan contracts, or as the patient moves through the different stages of their insurance plan.   PA #/Case ID/Reference #: [id number]

## 2024-01-27 ENCOUNTER — Other Ambulatory Visit (HOSPITAL_COMMUNITY): Payer: Self-pay

## 2024-01-31 ENCOUNTER — Other Ambulatory Visit (HOSPITAL_COMMUNITY): Payer: Self-pay

## 2024-01-31 ENCOUNTER — Other Ambulatory Visit: Payer: Self-pay

## 2024-02-01 ENCOUNTER — Other Ambulatory Visit (HOSPITAL_COMMUNITY): Payer: Self-pay

## 2024-02-02 ENCOUNTER — Other Ambulatory Visit (HOSPITAL_COMMUNITY): Payer: Self-pay

## 2024-02-02 MED ORDER — FLUZONE 0.5 ML IM SUSY
0.5000 mL | PREFILLED_SYRINGE | Freq: Once | INTRAMUSCULAR | 0 refills | Status: AC
  Filled 2024-02-02: qty 0.5, 1d supply, fill #0

## 2024-02-05 ENCOUNTER — Other Ambulatory Visit (HOSPITAL_COMMUNITY): Payer: Self-pay

## 2024-02-07 ENCOUNTER — Other Ambulatory Visit (HOSPITAL_COMMUNITY): Payer: Self-pay

## 2024-02-14 DIAGNOSIS — Z0001 Encounter for general adult medical examination with abnormal findings: Secondary | ICD-10-CM | POA: Diagnosis not present

## 2024-02-14 DIAGNOSIS — Z13220 Encounter for screening for lipoid disorders: Secondary | ICD-10-CM | POA: Diagnosis not present

## 2024-02-14 DIAGNOSIS — K5909 Other constipation: Secondary | ICD-10-CM | POA: Diagnosis not present

## 2024-02-14 DIAGNOSIS — G43909 Migraine, unspecified, not intractable, without status migrainosus: Secondary | ICD-10-CM | POA: Diagnosis not present

## 2024-02-14 DIAGNOSIS — T50905D Adverse effect of unspecified drugs, medicaments and biological substances, subsequent encounter: Secondary | ICD-10-CM | POA: Diagnosis not present

## 2024-02-14 DIAGNOSIS — Z8639 Personal history of other endocrine, nutritional and metabolic disease: Secondary | ICD-10-CM | POA: Diagnosis not present

## 2024-02-14 DIAGNOSIS — Z6824 Body mass index (BMI) 24.0-24.9, adult: Secondary | ICD-10-CM | POA: Diagnosis not present

## 2024-02-14 DIAGNOSIS — K219 Gastro-esophageal reflux disease without esophagitis: Secondary | ICD-10-CM | POA: Diagnosis not present

## 2024-02-14 DIAGNOSIS — R61 Generalized hyperhidrosis: Secondary | ICD-10-CM | POA: Diagnosis not present

## 2024-02-20 ENCOUNTER — Other Ambulatory Visit (HOSPITAL_COMMUNITY): Payer: Self-pay

## 2024-02-20 MED ORDER — LUBIPROSTONE 8 MCG PO CAPS
8.0000 ug | ORAL_CAPSULE | Freq: Two times a day (BID) | ORAL | 5 refills | Status: DC
  Filled 2024-02-20: qty 90, 23d supply, fill #0

## 2024-02-21 ENCOUNTER — Other Ambulatory Visit (HOSPITAL_COMMUNITY): Payer: Self-pay

## 2024-03-01 ENCOUNTER — Encounter: Payer: Self-pay | Admitting: Gastroenterology

## 2024-03-11 ENCOUNTER — Other Ambulatory Visit (HOSPITAL_COMMUNITY): Payer: Self-pay

## 2024-03-11 MED ORDER — AZITHROMYCIN 250 MG PO TABS
ORAL_TABLET | ORAL | 0 refills | Status: AC
  Filled 2024-03-11: qty 6, 5d supply, fill #0

## 2024-03-12 ENCOUNTER — Other Ambulatory Visit: Payer: Self-pay

## 2024-03-12 ENCOUNTER — Other Ambulatory Visit (HOSPITAL_COMMUNITY): Payer: Self-pay

## 2024-03-27 DIAGNOSIS — H5213 Myopia, bilateral: Secondary | ICD-10-CM | POA: Diagnosis not present

## 2024-04-17 ENCOUNTER — Ambulatory Visit: Admitting: Gastroenterology

## 2024-04-17 ENCOUNTER — Other Ambulatory Visit (HOSPITAL_COMMUNITY): Payer: Self-pay

## 2024-04-17 ENCOUNTER — Encounter (HOSPITAL_COMMUNITY): Payer: Self-pay

## 2024-04-17 ENCOUNTER — Encounter: Payer: Self-pay | Admitting: Gastroenterology

## 2024-04-17 VITALS — BP 110/72 | HR 90 | Temp 98.5°F | Ht 61.0 in | Wt 134.6 lb

## 2024-04-17 DIAGNOSIS — K625 Hemorrhage of anus and rectum: Secondary | ICD-10-CM

## 2024-04-17 DIAGNOSIS — K219 Gastro-esophageal reflux disease without esophagitis: Secondary | ICD-10-CM | POA: Insufficient documentation

## 2024-04-17 DIAGNOSIS — Q8501 Neurofibromatosis, type 1: Secondary | ICD-10-CM | POA: Diagnosis not present

## 2024-04-17 DIAGNOSIS — K5909 Other constipation: Secondary | ICD-10-CM

## 2024-04-17 DIAGNOSIS — K5900 Constipation, unspecified: Secondary | ICD-10-CM | POA: Insufficient documentation

## 2024-04-17 MED ORDER — TRULANCE 3 MG PO TABS
3.0000 mg | ORAL_TABLET | Freq: Every day | ORAL | 5 refills | Status: DC
  Filled 2024-04-17: qty 30, 30d supply, fill #0
  Filled 2024-05-09: qty 90, 90d supply, fill #0
  Filled 2024-05-09: qty 30, 30d supply, fill #0

## 2024-04-17 NOTE — Progress Notes (Signed)
 "    GI Office Note    Referring Provider: Leonce Anna JINNY DEVONNA Primary Care Physician:  Willis Anna JINNY DEVONNA  Primary Gastroenterologist: Carlin POUR. Cindie, DO   Chief Complaint   Chief Complaint  Patient presents with   Constipation    Having issues with constipation. Started taking Aimovig  and it makes her have severe constipation. When she does have a bowel movement there is lots of blood. Also would like to change Nexium to a different heart burn medication. With out so many long term side effects.      History of Present Illness   Anna Willis is a 44 y.o. female presenting today at the request of Anna Willis, [place] seen for rectal bleeding, constipation, GERD.  Patient has tried MiraLAX but does not find it very helpful, when she does have a bowel movement she bleeds.  Also on Nexium chronically for acid reflux which helps but she is concerned about risk of osteoporosis.  She feels like Aimovig  has exacerbated her chronic constipation after starting the medication several years back.  Does very well controlling migraines and she does not want to have to switch medications..  She has also tried Senokot and Dulcolax.  This caused severe cramping.  She tried increasing her fiber with Citrucel but she is not very consistent with it.  PCP started on lubiprostone  8 mcg 1 to 3 capsules with food twice daily as needed.  Discussed the use of AI scribe software for clinical note transcription with the patient, who gave verbal consent to proceed.  History of Present Illness Anna Willis is a 44 year old female with chronic constipation, gastroesophageal reflux disease, and neurofibromatosis type 1 who presents for evaluation of constipation with rectal bleeding, concern for osteoporosis risks on chronic PPI.  Constipation has been longstanding but has significantly worsened over the past two years after starting Aimovig  for migraines. Stools are hard, require considerable  straining, and occur one to two times per week. Straining often causes bright red blood in the toilet and occasional blood trickling with urination after difficult bowel movements. She has never had a colonoscopy.  She has trialed multiple constipation therapies. Dulcolax causes severe abdominal cramping. Miralax more than once daily or in higher amounts makes stools too pasty to pass. Amitiza  worsened constipation and caused significant cramps taking 16mcg BID and ineffective at lower doses. She started Citrucel two days ago and uses a daily prebiotic fiber drink. She has not tried other prescription options. She reports water intake is suboptimal, and she drinks one to two cups of coffee per day and rarely soda.  Gastroesophageal reflux symptoms have been present for at least 15-20 years with classic burning reflux. She takes Nexium daily, and missing a dose leads to burning regardless of food intake. She denies vomiting, regurgitation, dysphagia, epigastric pain. She reports chronic bad stomach since childhood and wishes to minimize medications and consider more natural approaches. She has never had an upper endoscopy.  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 Her father also has neurofibromatosis type 1. She is not aware of colon cancer, celiac disease, or Crohn's disease in the family. Past surgeries include appendectomy and hysterectomy with ovaries preserved.  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 She uses Excedrin for breakthrough migraines and Imitrex  for severe episodes, and taking Imitrex  in the morning before other medications can cause stomach burning.     Wt Readings from Last 3 Encounters:  04/17/24 134 lb 9.6 oz (61.1 kg)  05/27/23 128 lb (58.1 kg)  05/24/23 132 lb (59.9 kg)      Prior Data   Results  Labs from February 14, 2024: TSH 1.47, glucose 73, creatinine 0.69, sodium 135, potassium 4.5, albumin 4.1,  total bilirubin 0.5, alk phos 59, AST 17, ALT 12, white blood cell count 5.5, hemoglobin 12.2, MCV 87, platelets 322  Medications   Current Outpatient Medications  Medication Sig Dispense Refill   cetirizine  (ZYRTEC  ALLERGY) 10 MG tablet Take 1 tablet (10 mg total) by mouth daily. 30 tablet 0   Erenumab -aooe (AIMOVIG ) 70 MG/ML SOAJ Inject 70 mg into the skin every 28 (twenty-eight) days. 3 mL 3   esomeprazole (NEXIUM) 20 MG capsule Take 20 mg by mouth daily before breakfast.      ibuprofen  (ADVIL ) 600 MG tablet Take 1 tablet (600 mg total) by mouth 3 (three) times daily. (Patient taking differently: Take 600 mg by mouth 3 (three) times daily. PRN) 30 tablet 0   metoprolol  succinate (TOPROL -XL) 25 MG 24 hr tablet Take 1 tablet (25 mg total) by mouth daily. 90 tablet 0   Naltrexone -buPROPion  HCl ER (CONTRAVE ) 8-90 MG TB12 Take 2 tablets by mouth 2 (two) times daily. 120 tablet 4   ondansetron  (ZOFRAN ) 4 MG tablet Take 1 tablet (4 mg total) by mouth every 6 (six) hours. (Patient taking differently: Take 4 mg by mouth every 6 (six) hours. PRN) 40 tablet 9   Plecanatide  (TRULANCE ) 3 MG TABS Take 1 tablet (3 mg total) by mouth daily. 30 tablet 5   SUMAtriptan  (IMITREX ) 100 MG tablet Take 1 tablet (100 mg total) by mouth as needed for migraine. May repeat in 2 hours if headache persists or recurs.  Maximum 2 tablets in 24 hours. 10 tablet 5   albuterol  (VENTOLIN  HFA) 108 (90 Base) MCG/ACT inhaler Inhale 2 puffs into the lungs every 6 (six) hours as needed. (Patient not taking: Reported on 04/17/2024) 6.7 g 0   metoprolol  succinate (TOPROL -XL) 25 MG 24 hr tablet Take 1 tablet (25 mg total) by mouth daily. 90 tablet 0   Naltrexone -buPROPion  HCl ER (CONTRAVE ) 8-90 MG TB12 Take 2 tablets by mouth 2 (two) times daily. 120 tablet 11   Naltrexone -buPROPion  HCl ER (CONTRAVE ) 8-90 MG TB12 Take 2 tablets by mouth 2 (two) times daily. 120 tablet 3   ondansetron  (ZOFRAN ) 4 MG tablet Take 1 tablet (4 mg total) by mouth  every 6 (six) hours. 40 tablet 9   ondansetron  (ZOFRAN ) 4 MG tablet Take 1 tablet by mouth daily as needed 30 tablet 3   No current facility-administered medications for this visit.    Allergies   Allergies as of 04/17/2024 - Review Complete 04/17/2024  Allergen Reaction Noted   Amoxicillin Nausea And Vomiting 05/12/2017   Rocephin [ceftriaxone] Nausea And Vomiting 05/12/2017   Succinylcholine  Other (See Comments) 09/14/2019   Erythromycin Rash 05/12/2017    Past Medical History   Past Medical History:  Diagnosis Date   Acid reflux    Anxiety    Complication of anesthesia    muscle weakness after surgery      Migraine    Neurofibromatosis (HCC)    Scoliosis    Seasonal allergies    Tachycardia     Past Surgical History   Past Surgical History:  Procedure Laterality Date   ABDOMINAL HYSTERECTOMY  10/03/2019   Procedure: TOTAL ABDOMINAL HYSTERECTOMY;  Surgeon: Jayne Vonn DEL, MD;  Location: [facility];  Service: Gynecology;;  BILATERAL SALPINGECTOMY  10/03/2019   Procedure: OPEN BILATERAL SALPINGECTOMY;  Surgeon: Jayne Vonn DEL, MD;  Location: [facility];  Service: Gynecology;;   DILATION AND CURETTAGE OF UTERUS     LAPAROSCOPIC APPENDECTOMY N/A 07/18/2019   Procedure: APPENDECTOMY LAPAROSCOPIC;  Surgeon: Kallie Manuelita BROCKS, MD;  Location: [facility];  Service: General;  Laterality: N/A;   TOE AMPUTATION     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    WISDOM TOOTH EXTRACTION      Past Family History   Family History  Problem Relation Age of Onset   Migraines Mother    Epilepsy Mother    Endometriosis Mother        had hyst at age 59   Neurofibromatosis Father    Dementia Maternal Grandmother    Kidney disease Maternal Grandmother    Other Maternal Grandmother        shingles   Heart attack Maternal Grandfather    Breast cancer Paternal Aunt    Breast cancer Paternal Aunt    Breast cancer Paternal Aunt    Colon cancer Neg Hx    Celiac disease Neg Hx    Inflammatory bowel  disease Neg Hx     Past Social History   Social History   Socioeconomic History   Marital status: Married    Spouse name: Not on file   Number of children: 1   Years of education: 14   Highest education level: Not on file  Occupational History   Occupation: Hair dresser  Tobacco Use   Smoking status: Former    Types: Cigarettes   Smokeless tobacco: Never  Vaping Use   Vaping status: Never Used  Substance and Sexual Activity   Alcohol use: No   Drug use: No   Sexual activity: Yes    Birth control/protection: Surgical    Comment: has female partner  Other Topics Concern   Not on file  Social History Narrative   Lives at home with her partner, Jaquelinne.   Right-handed.   0.5 cup coffee and 1 soda per day.   Social Drivers of Health   Tobacco Use: Medium Risk (04/17/2024)   Patient History    Smoking Tobacco Use: Former    Smokeless Tobacco Use: Never    Passive Exposure: Not on Actuary Strain: Not on file  Food Insecurity: Not on file  Transportation Needs: Not on file  Physical Activity: Not on file  Stress: Not on file  Social Connections: Not on file  Intimate Partner Violence: Not on file  Depression (EYV7-0): Not on file  Alcohol Screen: Not on file  Housing: Not on file  Utilities: Not on file  Health Literacy: Not on file    Review of Systems   General: Negative for anorexia, weight loss, fever, chills, fatigue, weakness. Eyes: Negative for vision changes.  ENT: Negative for hoarseness, difficulty swallowing , nasal congestion. CV: Negative for chest pain, angina, palpitations, dyspnea on exertion, peripheral edema.  Respiratory: Negative for dyspnea at rest, dyspnea on exertion, cough, sputum, wheezing.  GI: See history of present illness. GU:  Negative for dysuria, hematuria, urinary incontinence, urinary frequency, nocturnal urination.  MS: Negative for joint pain, low back pain.  Derm: Negative for rash or itching.  Neuro:  Negative for weakness, abnormal sensation, seizure,  memory loss,  confusion. See hpi Psych: Negative for anxiety, depression, suicidal ideation, hallucinations.  Endo: Negative for unusual weight change.  Heme: Negative for bruising or bleeding. Allergy: Negative for  rash or hives.  Physical Exam   BP 110/72 (BP Location: Right Arm, Patient Position: Sitting, Cuff Size: Normal)   Pulse 90   Temp 98.5 F (36.9 C) (Temporal)   Ht 5' 1 (1.549 m)   Wt 134 lb 9.6 oz (61.1 kg)   LMP 09/27/2019   BMI 25.43 kg/m    General: Well-nourished, well-developed in no acute distress.  Head: Normocephalic, atraumatic.   Eyes: Conjunctiva pink, no icterus. Mouth: Oropharyngeal mucosa moist and pink  Neck: Supple without thyromegaly, masses, or lymphadenopathy.  Lungs: Clear to auscultation bilaterally.  Heart: Regular rate and rhythm, no murmurs rubs or gallops.  Abdomen: Bowel sounds are normal, nontender, nondistended, no hepatosplenomegaly or masses,  no abdominal bruits or hernia, no rebound or guarding.   Rectal: not performed Extremities: No lower extremity edema. No clubbing or deformities.  Neuro: Alert and oriented x 4 , grossly normal neurologically.  Skin: Warm and dry, no rash or jaundice.   Psych: Alert and cooperative, normal mood and affect.  Labs   See above  Imaging Studies   No results found.  Assessment/Plan:   Assessment & Plan Chronic constipation with rectal bleeding Severe constipation with intermittent rectal bleeding, refractory to multiple agents, worsened by Aimovig .  - She reports brbpr with straining and large stool - Titrate fiber supplementation to at least 4 grams daily, starting low to minimize bloating. - Increase water intake to 65 ounces daily (1/2 her body weight in ounces). -slowly increase dietary fiber -increase daily exercise, 300 minutes per week for goal -start Trulance  3mg  daily -Communicate via MyChart regarding regimen response and  management. -Patient not concerned about bleeding at this time. I will discuss with her to consider colonoscopy to evaluate bleeding especially in light of her NF1 and increased risks of GISTs.  Gastroesophageal reflux disease Chronic GERD controlled on Nexium, with osteoporosis risk concern and preference to minimize medications. - Taper Nexium gradually to avoid rebound acid hypersecretion. - Use famotidine as needed for breakthrough symptoms during PPI wean. - Trial reflux raft product for symptom control, Reflux Gourmet Rescue - Consider endoscopy for Barrett's esophagus screening at age 18 or sooner if symptoms worsen. - reach out with progress report via mychart in few weks.  Neurofibromatosis type 1 -increased risks of GISTs -patient should monitor for any GI symptoms such as abdominal pain, bloating, vomiting, early satiety, weight loss, blood in stool or emesis, dysphagia -will discuss possible colonoscopy given rectal bleeding, may consider upper endoscopy -she should consider discussing with her PCP regarding increased malignancy risks and consider monitoring by [facility] ([facility])     Sonny RAMAN. Ezzard, MHS, PA-C [facility]  "

## 2024-04-17 NOTE — Patient Instructions (Addendum)
 For constipation: Increase supplemental fiber slowly, goal of 4 grams daily Increase dietary fiber slowly. Drink at least 65 ounces of water daily. Increase daily exercise, goal of 300 minutes per week.  Start Trulance  3mg  daily for constipaiton. Reach out with progress report via mychart in couple of weeks.  For GERD: You can slowly try to wean off PPI (esomeprazole) over the next month. Start by taking esomeprazole every other day for one week, if tolerated then drop back to 3 times per week for a week, then 2 times per week for a week, then 1 time a week for a week.  Start Reflux Gourmet Rescue, take 1 teaspoon after meals and before bed. You can purchase on [employer] or at [email or web address]. You can use famotidine 20mg  twice daily as needed for breakthrough heartburn. Reach out with progress report via mychart in couple of weeks.   I will look into GI malignancy risks in setting of neurofibromatosis I and let you know what I find.   For chronic GERD, you should consider upper endoscopy to screen for Barrett's esophagus when you turn 50.

## 2024-04-24 ENCOUNTER — Other Ambulatory Visit: Payer: Self-pay

## 2024-04-24 ENCOUNTER — Other Ambulatory Visit (HOSPITAL_COMMUNITY): Payer: Self-pay

## 2024-04-24 ENCOUNTER — Other Ambulatory Visit: Payer: Self-pay | Admitting: Neurology

## 2024-04-24 MED ORDER — AIMOVIG 70 MG/ML ~~LOC~~ SOAJ
70.0000 mg | SUBCUTANEOUS | 0 refills | Status: AC
  Filled 2024-04-24 (×2): qty 3, 90d supply, fill #0

## 2024-04-25 ENCOUNTER — Other Ambulatory Visit: Payer: Self-pay

## 2024-04-25 ENCOUNTER — Encounter: Payer: Self-pay | Admitting: Pharmacy Technician

## 2024-04-25 ENCOUNTER — Other Ambulatory Visit (HOSPITAL_COMMUNITY): Payer: Self-pay

## 2024-04-27 ENCOUNTER — Other Ambulatory Visit (HOSPITAL_COMMUNITY): Payer: Self-pay

## 2024-05-09 ENCOUNTER — Other Ambulatory Visit (HOSPITAL_COMMUNITY): Payer: Self-pay

## 2024-05-09 ENCOUNTER — Other Ambulatory Visit: Payer: Self-pay

## 2024-05-09 ENCOUNTER — Other Ambulatory Visit: Payer: Self-pay | Admitting: Gastroenterology

## 2024-05-09 MED ORDER — TRULANCE 3 MG PO TABS
3.0000 mg | ORAL_TABLET | Freq: Every day | ORAL | 3 refills | Status: AC
  Filled 2024-05-09: qty 90, 90d supply, fill #0

## 2024-05-28 ENCOUNTER — Ambulatory Visit: Payer: Commercial Managed Care - PPO | Admitting: Neurology
